# Patient Record
Sex: Male | Born: 1955 | Race: Black or African American | Hispanic: No | Marital: Single | Smoking: Never smoker
Health system: Southern US, Community
[De-identification: ages and names within clinical notes are randomized; demographics above are authoritative.]

## PROBLEM LIST (undated history)

## (undated) DIAGNOSIS — N400 Enlarged prostate without lower urinary tract symptoms: Secondary | ICD-10-CM

## (undated) DIAGNOSIS — I509 Heart failure, unspecified: Secondary | ICD-10-CM

## (undated) DIAGNOSIS — I2510 Atherosclerotic heart disease of native coronary artery without angina pectoris: Secondary | ICD-10-CM

## (undated) DIAGNOSIS — C61 Malignant neoplasm of prostate: Secondary | ICD-10-CM

## (undated) DIAGNOSIS — Z923 Personal history of irradiation: Secondary | ICD-10-CM

## (undated) DIAGNOSIS — I10 Essential (primary) hypertension: Secondary | ICD-10-CM

## (undated) HISTORY — DX: Personal history of irradiation: Z92.3

## (undated) HISTORY — PX: CARDIAC CATHETERIZATION: SHX172

## (undated) HISTORY — DX: Essential (primary) hypertension: I10

## (undated) HISTORY — DX: Benign prostatic hyperplasia without lower urinary tract symptoms: N40.0

## (undated) HISTORY — DX: Malignant neoplasm of prostate: C61

## (undated) HISTORY — PX: BIOPSY PROSTATE: PRO28

## (undated) HISTORY — DX: Heart failure, unspecified: I50.9

---

## 2000-06-16 ENCOUNTER — Emergency Department (HOSPITAL_COMMUNITY): Admission: EM | Admit: 2000-06-16 | Discharge: 2000-06-16 | Payer: Self-pay | Admitting: Emergency Medicine

## 2000-06-18 ENCOUNTER — Emergency Department (HOSPITAL_COMMUNITY): Admission: EM | Admit: 2000-06-18 | Discharge: 2000-06-18 | Payer: Self-pay | Admitting: Emergency Medicine

## 2000-06-29 ENCOUNTER — Encounter: Payer: Self-pay | Admitting: Emergency Medicine

## 2000-06-29 ENCOUNTER — Emergency Department (HOSPITAL_COMMUNITY): Admission: EM | Admit: 2000-06-29 | Discharge: 2000-06-29 | Payer: Self-pay | Admitting: Emergency Medicine

## 2000-08-14 ENCOUNTER — Inpatient Hospital Stay (HOSPITAL_COMMUNITY): Admission: EM | Admit: 2000-08-14 | Discharge: 2000-08-16 | Payer: Self-pay | Admitting: Emergency Medicine

## 2000-09-08 ENCOUNTER — Emergency Department (HOSPITAL_COMMUNITY): Admission: EM | Admit: 2000-09-08 | Discharge: 2000-09-08 | Payer: Self-pay | Admitting: Emergency Medicine

## 2003-09-09 ENCOUNTER — Emergency Department (HOSPITAL_COMMUNITY): Admission: EM | Admit: 2003-09-09 | Discharge: 2003-09-10 | Payer: Self-pay | Admitting: Emergency Medicine

## 2007-01-05 ENCOUNTER — Emergency Department (HOSPITAL_COMMUNITY): Admission: EM | Admit: 2007-01-05 | Discharge: 2007-01-05 | Payer: Self-pay | Admitting: Emergency Medicine

## 2007-08-02 DIAGNOSIS — I10 Essential (primary) hypertension: Secondary | ICD-10-CM

## 2008-05-27 ENCOUNTER — Emergency Department (HOSPITAL_COMMUNITY): Admission: EM | Admit: 2008-05-27 | Discharge: 2008-05-28 | Payer: Self-pay | Admitting: *Deleted

## 2008-08-21 ENCOUNTER — Emergency Department (HOSPITAL_COMMUNITY): Admission: EM | Admit: 2008-08-21 | Discharge: 2008-08-21 | Payer: Self-pay | Admitting: Emergency Medicine

## 2008-09-19 ENCOUNTER — Emergency Department (HOSPITAL_COMMUNITY): Admission: EM | Admit: 2008-09-19 | Discharge: 2008-09-19 | Payer: Self-pay | Admitting: Emergency Medicine

## 2008-09-24 ENCOUNTER — Ambulatory Visit: Payer: Self-pay | Admitting: Nurse Practitioner

## 2008-09-24 LAB — CONVERTED CEMR LAB
AST: 18 units/L (ref 0–37)
BUN: 14 mg/dL (ref 6–23)
Basophils Absolute: 0 10*3/uL (ref 0.0–0.1)
CO2: 21 meq/L (ref 19–32)
Eosinophils Absolute: 0.1 10*3/uL (ref 0.0–0.7)
Eosinophils Relative: 2 % (ref 0–5)
Monocytes Absolute: 0.4 10*3/uL (ref 0.1–1.0)
Monocytes Relative: 9 % (ref 3–12)
Neutrophils Relative %: 48 % (ref 43–77)
Sodium: 141 meq/L (ref 135–145)
TSH: 2.181 microintl units/mL (ref 0.350–4.50)
WBC: 4.7 10*3/uL (ref 4.0–10.5)

## 2008-09-29 ENCOUNTER — Ambulatory Visit: Payer: Self-pay | Admitting: *Deleted

## 2008-10-08 ENCOUNTER — Ambulatory Visit: Payer: Self-pay | Admitting: Nurse Practitioner

## 2008-10-08 LAB — CONVERTED CEMR LAB
Cholesterol: 125 mg/dL (ref 0–200)
HDL: 43 mg/dL (ref >39)
LDL Cholesterol: 76 mg/dL (ref 0–99)

## 2008-10-10 ENCOUNTER — Encounter (INDEPENDENT_AMBULATORY_CARE_PROVIDER_SITE_OTHER): Payer: Self-pay | Admitting: Nurse Practitioner

## 2008-10-22 ENCOUNTER — Ambulatory Visit: Payer: Self-pay | Admitting: Nurse Practitioner

## 2009-01-22 ENCOUNTER — Ambulatory Visit: Payer: Self-pay | Admitting: Nurse Practitioner

## 2009-02-12 ENCOUNTER — Ambulatory Visit: Payer: Self-pay | Admitting: Nurse Practitioner

## 2009-02-26 ENCOUNTER — Ambulatory Visit: Payer: Self-pay | Admitting: Nurse Practitioner

## 2009-02-26 DIAGNOSIS — N529 Male erectile dysfunction, unspecified: Secondary | ICD-10-CM

## 2009-05-11 ENCOUNTER — Emergency Department (HOSPITAL_COMMUNITY): Admission: EM | Admit: 2009-05-11 | Discharge: 2009-05-11 | Payer: Self-pay | Admitting: Family Medicine

## 2009-07-13 ENCOUNTER — Ambulatory Visit: Payer: Self-pay | Admitting: Nurse Practitioner

## 2009-07-13 LAB — CONVERTED CEMR LAB: Microalb, Ur: 0.59 mg/dL (ref 0.00–1.89)

## 2009-12-04 ENCOUNTER — Telehealth (INDEPENDENT_AMBULATORY_CARE_PROVIDER_SITE_OTHER): Payer: Self-pay | Admitting: Nurse Practitioner

## 2010-01-18 ENCOUNTER — Telehealth (INDEPENDENT_AMBULATORY_CARE_PROVIDER_SITE_OTHER): Payer: Self-pay | Admitting: Nurse Practitioner

## 2010-01-27 ENCOUNTER — Ambulatory Visit: Payer: Self-pay | Admitting: Nurse Practitioner

## 2010-01-27 LAB — CONVERTED CEMR LAB
Bilirubin Urine: NEGATIVE
Protein, U semiquant: NEGATIVE
Specific Gravity, Urine: 1.02
WBC Urine, dipstick: NEGATIVE
pH: 6

## 2010-01-28 DIAGNOSIS — C61 Malignant neoplasm of prostate: Secondary | ICD-10-CM | POA: Insufficient documentation

## 2010-01-28 LAB — CONVERTED CEMR LAB
ALT: 16 units/L (ref 0–53)
AST: 15 units/L (ref 0–37)
Albumin: 4.3 g/dL (ref 3.5–5.2)
Chloride: 100 meq/L (ref 96–112)
Eosinophils Absolute: 0.1 10*3/uL (ref 0.0–0.7)
HCT: 44.4 % (ref 39.0–52.0)
HDL: 42 mg/dL (ref >39)
Hemoglobin: 14.4 g/dL (ref 13.0–17.0)
LDL Cholesterol: 89 mg/dL (ref 0–99)
MCHC: 32.4 g/dL (ref 30.0–36.0)
MCV: 82.2 fL (ref 78.0–100.0)
Microalb, Ur: 0.5 mg/dL (ref 0.00–1.89)
Monocytes Absolute: 0.6 10*3/uL (ref 0.1–1.0)
Monocytes Relative: 10 % (ref 3–12)
Neutro Abs: 2.8 10*3/uL (ref 1.7–7.7)
RBC: 5.4 M/uL (ref 4.22–5.81)
Sodium: 139 meq/L (ref 135–145)
TSH: 2.602 microintl units/mL (ref 0.350–4.500)
Total Bilirubin: 0.8 mg/dL (ref 0.3–1.2)
Total CHOL/HDL Ratio: 3.3
Total Protein: 7.6 g/dL (ref 6.0–8.3)

## 2010-02-10 ENCOUNTER — Ambulatory Visit: Payer: Self-pay | Admitting: Nurse Practitioner

## 2010-02-10 ENCOUNTER — Telehealth (INDEPENDENT_AMBULATORY_CARE_PROVIDER_SITE_OTHER): Payer: Self-pay | Admitting: Nurse Practitioner

## 2010-02-10 DIAGNOSIS — K029 Dental caries, unspecified: Secondary | ICD-10-CM | POA: Insufficient documentation

## 2010-04-02 ENCOUNTER — Encounter (INDEPENDENT_AMBULATORY_CARE_PROVIDER_SITE_OTHER): Payer: Self-pay | Admitting: Nurse Practitioner

## 2010-04-26 ENCOUNTER — Encounter (INDEPENDENT_AMBULATORY_CARE_PROVIDER_SITE_OTHER): Payer: Self-pay | Admitting: Nurse Practitioner

## 2010-04-26 DIAGNOSIS — C61 Malignant neoplasm of prostate: Secondary | ICD-10-CM

## 2010-04-26 HISTORY — DX: Malignant neoplasm of prostate: C61

## 2010-05-05 ENCOUNTER — Encounter (INDEPENDENT_AMBULATORY_CARE_PROVIDER_SITE_OTHER): Payer: Self-pay | Admitting: Nurse Practitioner

## 2010-05-10 ENCOUNTER — Ambulatory Visit: Admission: RE | Admit: 2010-05-10 | Discharge: 2010-06-07 | Payer: Self-pay | Admitting: Radiation Oncology

## 2010-05-11 ENCOUNTER — Encounter (INDEPENDENT_AMBULATORY_CARE_PROVIDER_SITE_OTHER): Payer: Self-pay | Admitting: Nurse Practitioner

## 2010-05-14 LAB — BUN: BUN: 10 mg/dL (ref 6–23)

## 2010-05-21 ENCOUNTER — Ambulatory Visit (HOSPITAL_COMMUNITY): Admission: RE | Admit: 2010-05-21 | Discharge: 2010-05-21 | Payer: Self-pay | Admitting: Radiation Oncology

## 2010-05-24 ENCOUNTER — Encounter (INDEPENDENT_AMBULATORY_CARE_PROVIDER_SITE_OTHER): Payer: Self-pay | Admitting: Nurse Practitioner

## 2010-05-29 ENCOUNTER — Encounter (INDEPENDENT_AMBULATORY_CARE_PROVIDER_SITE_OTHER): Payer: Self-pay | Admitting: Nurse Practitioner

## 2010-06-07 ENCOUNTER — Encounter (INDEPENDENT_AMBULATORY_CARE_PROVIDER_SITE_OTHER): Payer: Self-pay | Admitting: Nurse Practitioner

## 2010-06-11 ENCOUNTER — Ambulatory Visit: Payer: Self-pay | Admitting: Nurse Practitioner

## 2010-07-14 ENCOUNTER — Ambulatory Visit
Admission: RE | Admit: 2010-07-14 | Discharge: 2010-07-29 | Payer: Self-pay | Source: Home / Self Care | Attending: Radiation Oncology | Admitting: Radiation Oncology

## 2010-07-15 ENCOUNTER — Encounter (INDEPENDENT_AMBULATORY_CARE_PROVIDER_SITE_OTHER): Payer: Self-pay | Admitting: Nurse Practitioner

## 2010-08-20 ENCOUNTER — Ambulatory Visit
Admission: RE | Admit: 2010-08-20 | Discharge: 2010-08-31 | Payer: Self-pay | Source: Home / Self Care | Attending: Radiation Oncology | Admitting: Radiation Oncology

## 2010-08-21 ENCOUNTER — Emergency Department (HOSPITAL_COMMUNITY)
Admission: EM | Admit: 2010-08-21 | Discharge: 2010-08-21 | Payer: Self-pay | Source: Home / Self Care | Admitting: Family Medicine

## 2010-08-31 NOTE — Letter (Signed)
Summary: SLIDING FEE SCALE /ELIGIBILITY AGREEMENT  SLIDING FEE SCALE /ELIGIBILITY AGREEMENT   Imported By: Arta Bruce 10/10/2008 11:50:56  _____________________________________________________________________  External Attachment:    Type:   Image     Comment:   External Document

## 2010-08-31 NOTE — Assessment & Plan Note (Signed)
Summary: Complete Physical Exam   Vital Signs:  Patient profile:   55 year old male Weight:      201.0 pounds BMI:     33.06 BSA:     2.00 Temp:     97.8 degrees F oral Pulse rate:   55 / minute Pulse rhythm:   regular Resp:     16 per minute BP sitting:   128 / 84  (left arm) Cuff size:   large  Vitals Entered By: Levon Hedger (January 27, 2010 8:31 AM)  Nutrition Counseling: Patient's BMI is greater than 25 and therefore counseled on weight management options. CC: CPE...cough x 1 month, Hypertension Management Is Patient Diabetic? No Pain Assessment Patient in pain? no       Does patient need assistance? Functional Status Self care Ambulation Normal   CC:  CPE...cough x 1 month and Hypertension Management.  History of Present Illness:  Pt into the office for a complete physical exam  Optho - No recent eye exam.  No current problems with eyes.    Dental - Pt has not been to the dentist in several years.  No current dental problems but admits that he has some caries and his teeth needs some attention.  ? about the dental clinic  Colonscopy - no history of colonscopy no blood noted in stools regular bowel habits  tdap - last over 10 years ago.  Obesity - weight up 6 pounds since the last visit.  no meaningful exercise.  Hypertension History:      He denies headache, chest pain, and palpitations.  Pt is taking blood presure medications as ordered.        Positive major cardiovascular risk factors include male age 72 years old or older and hypertension.  Negative major cardiovascular risk factors include negative family history for ischemic heart disease and non-tobacco-user status.        Further assessment for target organ damage reveals no history of ASHD, cardiac end-organ damage (CHF/LVH), stroke/TIA, peripheral vascular disease, renal insufficiency, or hypertensive retinopathy.     Habits & Providers  Alcohol-Tobacco-Diet     Alcohol drinks/day: 0  Alcohol type: quit in 2007     Tobacco Status: never  Exercise-Depression-Behavior     Does Patient Exercise: no     Depression Counseling: not indicated; screening negative for depression     Drug Use: past     Seat Belt Use: 0     Sun Exposure: occasionally  Comments: PHQ-9 score = 0  Medications Prior to Update: 1)  Norvasc 10 Mg Tabs (Amlodipine Besylate) .Marland Kitchen.. 1 Tablet By Mouth Daily For Blood Pressure 2)  Lisinopril-Hydrochlorothiazide 20-25 Mg Tabs (Lisinopril-Hydrochlorothiazide) .Marland Kitchen.. 1 Tablet By Mouth Daily For Blood Pressure 3)  Toprol Xl 100 Mg Xr24h-Tab (Metoprolol Succinate) .... One Tablet By Mouth Daily For Blood Pressure 4)  Viagra 100 Mg Tabs (Sildenafil Citrate) .... One Tablet By Mouth 30 Minutes Before Sexual Activity  Allergies: No Known Drug Allergies  Social History: Drug Use:  past  Review of Systems General:  Denies fever. Eyes:  Denies blurring. ENT:  Denies earache. CV:  Denies chest pain or discomfort. Resp:  Denies cough. GI:  Denies abdominal pain, constipation, nausea, and vomiting. GU:  Denies discharge and nocturia. MS:  Denies joint pain. Derm:  Denies rash. Neuro:  Denies headaches. Psych:  Denies anxiety and depression.  Physical Exam  General:  alert.   Head:  normocephalic.   Eyes:  pupils round.   Ears:  R ear normal and L ear normal.  Tm visible with bony landmarks present Nose:  no nasal discharge.   Mouth:  pharynx pink and moist and fair dentition.   some missing teeth with caries Neck:  supple.   Chest Wall:  no masses.   Breasts:  no gynecomastia.   Lungs:  normal breath sounds.   Heart:  normal rate and regular rhythm.   Abdomen:  soft, non-tender, and normal bowel sounds.   Rectal:  no external abnormalities.   Genitalia:  circumcised.   no inguinal hernias Prostate:  no gland enlargement.  nontender Msk:  up to the exam table Pulses:  R radial normal and L radial normal.   Extremities:  no edema Neurologic:   alert & oriented X3.   Skin:  color normal.   Psych:  Oriented X3 and good eye contact.     Impression & Recommendations:  Problem # 1:  Preventive Health Care (ICD-V70.0) PHQ-9 score = 0 Labs done today - fasting tdap given ekg done stool cards given x 3 take home colonscopy indications given to pt - unable to afford guaiac negative today in office.   prostate normal  Problem # 2:  HYPERTENSION, BENIGN ESSENTIAL (ICD-401.1) DASH diet stable continue current meds His updated medication list for this problem includes:    Norvasc 10 Mg Tabs (Amlodipine besylate) .Marland Kitchen... 1 tablet by mouth daily for blood pressure    Lisinopril-hydrochlorothiazide 20-25 Mg Tabs (Lisinopril-hydrochlorothiazide) .Marland Kitchen... 1 tablet by mouth daily for blood pressure    Toprol Xl 100 Mg Xr24h-tab (Metoprolol succinate) ..... One tablet by mouth daily for blood pressure  Orders: UA Dipstick w/o Micro (manual) (29562) EKG w/ Interpretation (93000) T-Lipid Profile (13086-57846) T-Comprehensive Metabolic Panel (96295-28413) T-PSA (24401-02725) T-CBC w/Diff (36644-03474) Rapid HIV  (25956) T-Urine Microalbumin w/creat. ratio [phone])  Complete Medication List: 1)  Norvasc 10 Mg Tabs (Amlodipine besylate) .Marland Kitchen.. 1 tablet by mouth daily for blood pressure 2)  Lisinopril-hydrochlorothiazide 20-25 Mg Tabs (Lisinopril-hydrochlorothiazide) .Marland Kitchen.. 1 tablet by mouth daily for blood pressure 3)  Toprol Xl 100 Mg Xr24h-tab (Metoprolol succinate) .... One tablet by mouth daily for blood pressure 4)  Viagra 100 Mg Tabs (Sildenafil citrate) .... One tablet by mouth 30 minutes before sexual activity  Other Orders: Hemoccult Cards MCR Screening (G0107) T-Syphilis Test (RPR) [id number]) T-TSH [phone]) T-GC Probe, urine (32202-54270) Hemoccult Cards -3 specimans (take home) (62376) Tdap => 69yrs IM (28315) Admin 1st Vaccine (17616) Admin 1st Vaccine [facility]) [phone])  Hypertension Assessment/Plan:      The  patient's hypertensive risk group is category B: At least one risk factor (excluding diabetes) with no target organ damage.  His calculated 10 year risk of coronary heart disease is 4 %.  Today's blood pressure is 128/84.  His blood pressure goal is < 140/90.  Patient Instructions: 1)  Blood pressure is doing great.  Continue current medications. 2)  You have received the tetanus vaccine today. It is good for 10 years so you won't need it again until age 65 3)  Return stool cards when available 4)  You will be notified of any abnormal lab results 5)  Follow up in 6 months or sooner if needed.  Laboratory Results   Urine Tests  Date/Time Received: January 27, 2010 8:35 AM   Routine Urinalysis   Color: lt. yellow Glucose: negative   (Normal Range: Negative) Bilirubin: negative   (Normal Range: Negative) Ketone: negative   (Normal Range: Negative) Spec. Gravity: 1.020   (Normal Range: 1.003-1.035)  Blood: negative   (Normal Range: Negative) pH: 6.0   (Normal Range: 5.0-8.0) Protein: negative   (Normal Range: Negative) Urobilinogen: 0.2   (Normal Range: 0-1) Nitrite: negative   (Normal Range: Negative) Leukocyte Esterace: negative   (Normal Range: Negative)        EKG  Procedure date:  01/27/2010  Findings:      sinus brady LVH   Prevention & Chronic Care Immunizations   Influenza vaccine: Not documented   Influenza vaccine deferral: Refused  (07/13/2009)    Tetanus booster: 01/27/2010: Tdap   Td booster deferral: Refused  (07/13/2009)    Pneumococcal vaccine: Not documented  Colorectal Screening   Hemoccult: Not documented   Hemoccult action/deferral: Ordered  (01/27/2010)   Hemoccult due: 01/28/2011    Colonoscopy: Not documented   Colonoscopy action/deferral: Deferred  (01/27/2010)  Other Screening   PSA: Not documented   PSA ordered.   PSA action/deferral: Discussed-PSA requested  (01/27/2010)   PSA due due: 01/28/2011   Smoking status: never   (01/27/2010)  Lipids   Total Cholesterol: 125  (10/08/2008)   Lipid panel action/deferral: Lipid Panel ordered   LDL: 76  (10/08/2008)   LDL Direct: Not documented   HDL: 43  (10/08/2008)   Triglycerides: 32  (10/08/2008)   Lipid panel due: 02/26/2010  Hypertension   Last Blood Pressure: 128 / 84  (01/27/2010)   Serum creatinine: 1.00  (09/24/2008)   BMP action: Ordered   Serum potassium 4.3  (09/24/2008) CMP ordered    Basic metabolic panel due: 02/26/2010    Hypertension flowsheet reviewed?: Yes   Progress toward BP goal: At goal  Self-Management Support :   Personal Goals (by the next clinic visit) :      Personal blood pressure goal: 130/80  (07/13/2009)   Patient will work on the following items until the next clinic visit to reach self-care goals:     Medications and monitoring: take my medicines every day, bring all of my medications to every visit  (01/27/2010)     Eating: eat more vegetables  (01/27/2010)     Activity: take a 30 minute walk every day  (01/27/2010)    Hypertension self-management support: Not documented   Nursing Instructions: Give tetanus booster today     Tetanus/Td Vaccine    Vaccine Type: Tdap    Site: right deltoid    Mfr: Sanofi Pasteur    Dose: 0.5 ml    Route: IM    Given by: Levon Hedger    Exp. Date: 06/02/2012    Lot #: [id number]    VIS given: 06/19/07 version given January 27, 2010.

## 2010-08-31 NOTE — Assessment & Plan Note (Signed)
Summary: HTN   Vital Signs:  Patient profile:   55 year old male Weight:      196.9 pounds BMI:     32.38 BSA:     1.98 Temp:     98.9 degrees F oral Pulse rate:   70 / minute Pulse rhythm:   regular Resp:     16 per minute BP sitting:   150 / 100  (left arm) Cuff size:   large  Vitals Entered By: Levon Hedger (June 11, 2010 3:59 PM)  Nutrition Counseling: Patient's BMI is greater than 25 and therefore counseled on weight management options. CC: [facility], Hypertension Management Is Patient Diabetic? No Pain Assessment Patient in pain? no       Does patient need assistance? Functional Status Self care Ambulation Normal   CC:  [facility] and Hypertension Management.  History of Present Illness:  Pt into the office for f/u on urology. Pt was dx with prostate cancer since last visit He has elected to get the seeds. He will f/u with urology in office. Pt well imformed about dx. Pt states that he is feeling well considering his recent treatment he plans to make every effort to keep up follow up  Hypertension History:      He denies headache, chest pain, and palpitations.  He notes no problems with any antihypertensive medication side effects.  Pt is without some of his blood pressure medications.        Positive major cardiovascular risk factors include male age 33 years old or older and hypertension.  Negative major cardiovascular risk factors include negative family history for ischemic heart disease and non-tobacco-user status.        Further assessment for target organ damage reveals no history of ASHD, cardiac end-organ damage (CHF/LVH), stroke/TIA, peripheral vascular disease, renal insufficiency, or hypertensive retinopathy.     Habits & Providers  Alcohol-Tobacco-Diet     Alcohol drinks/day: 0     Alcohol type: quit in 2007     Tobacco Status: never  Exercise-Depression-Behavior     Does  Patient Exercise: no     Depression Counseling: not indicated; screening negative for depression     Drug Use: past     Seat Belt Use: 0     Sun Exposure: occasionally  Allergies (verified): No Known Drug Allergies  Review of Systems General:  Denies fever. CV:  Denies chest pain or discomfort. Resp:  Denies cough. GI:  Denies abdominal pain, nausea, and vomiting. GU:  Complains of erectile dysfunction; denies dysuria.  Physical Exam  General:  alert.   Head:  normocephalic.   Neurologic:  alert & oriented X3.   Skin:  color normal.   Psych:  Oriented X3.     Impression & Recommendations:  Problem # 1:  HYPERTENSION, BENIGN ESSENTIAL (ICD-401.1) BP is elevated pt to get medications on next week His updated medication list for this problem includes:    Norvasc 10 Mg Tabs (Amlodipine besylate) .Marland Kitchen... 1 tablet by mouth daily for blood pressure    Lisinopril-hydrochlorothiazide 20-25 Mg Tabs (Lisinopril-hydrochlorothiazide) .Marland Kitchen... 1 tablet by mouth daily for blood pressure    Toprol Xl 100 Mg Xr24h-tab (Metoprolol succinate) ..... 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 1 tablet by mouth daily for blood pressure 2)  Lisinopril-hydrochlorothiazide 20-25 Mg Tabs (Lisinopril-hydrochlorothiazide) .Marland Kitchen.. 1 tablet by mouth daily for blood pressure 3)  Toprol Xl 100 Mg Xr24h-tab (Metoprolol succinate) .... One tablet by mouth daily for blood pressure 4)  Flomax 0.4 Mg Caps (Tamsulosin hcl) .... One tablet by mouth daily for prostate  Hypertension Assessment/Plan:      The patient's hypertensive risk group is category B: At least one risk factor (excluding diabetes)  with no target organ damage.  His calculated 10 year risk of coronary heart disease is 9 %.  Today's blood pressure is 150/100.  His blood pressure goal is < 140/90.  Patient Instructions: 1)  You need 3 blood pressure medications and 1 prostate medications. 2)  You should be able to get all 4 medications filed on next week at the pharmacy. 3)  Keep your appointment with urology for follow up on your prostate  Prescriptions: TOPROL XL 100 MG XR24H-TAB (METOPROLOL SUCCINATE) One tablet by mouth daily for blood pressure  #30 x 5   Entered and Authorized by:   Lehman Prom FNP   Signed by:   Lehman Prom FNP on 06/11/2010   Method used:   Faxed to ...       [facility] (retail)       [address] [place], [place]  [address]       Ph: [phone]       Fax: [phone]   RxID:   [id number]    Orders Added: 1)  Est. Patient Level III [[address]]    Prevention & Chronic Care Immunizations   Influenza vaccine: Refused  (06/11/2010)   Influenza vaccine deferral: Refused  (07/13/2009)    Tetanus booster: 01/27/2010: Tdap   Td booster deferral: Refused  (07/13/2009)    Pneumococcal vaccine: Not documented  Colorectal Screening   Hemoccult: Not documented   Hemoccult action/deferral: Ordered  (01/27/2010)   Hemoccult due: 01/28/2011    Colonoscopy: Not documented   Colonoscopy action/deferral: Deferred  (01/27/2010)  Other Screening   PSA: 15.31  (01/27/2010)   PSA action/deferral: Discussed-PSA requested  (01/27/2010)   PSA due due: 01/28/2011   Smoking status: never  (06/11/2010)  Lipids   Total Cholesterol: 140  (01/27/2010)   Lipid panel action/deferral: Lipid Panel ordered   LDL: 89  (01/27/2010)   LDL Direct: Not documented   HDL: 42  (01/27/2010)   Triglycerides: 47  (01/27/2010)   Lipid panel due: 02/26/2010  Hypertension   Last Blood Pressure: 150 / 100  (06/11/2010)   Serum creatinine: 0.94   (01/27/2010)   BMP action: Ordered   Serum potassium 3.6  (01/27/2010)   Basic metabolic panel due: 02/26/2010  Self-Management Support :   Personal Goals (by the next clinic visit) :      Personal blood pressure goal: 130/80  (07/13/2009)   Hypertension self-management support: Not documented

## 2010-08-31 NOTE — Letter (Signed)
Summary: DENTAL REFERRAL  DENTAL REFERRAL   Imported By: Arta Bruce 02/11/2010 12:27:49  _____________________________________________________________________  External Attachment:    Type:   Image     Comment:   External Document

## 2010-08-31 NOTE — Letter (Signed)
Summary: [facility]   Imported By: Arta Bruce 06/14/2010 10:26:51  _____________________________________________________________________  External Attachment:    Type:   Image     Comment:   External Document

## 2010-08-31 NOTE — Letter (Signed)
Summary: [facility]   Imported By: Arta Bruce 05/12/2010 14:58:57  _____________________________________________________________________  External Attachment:    Type:   Image     Comment:   External Document

## 2010-08-31 NOTE — Progress Notes (Signed)
Summary: refills  Phone Note Call from Patient Call back at [phone]   Summary of Call: The pt needs  more refills from his viagra to be call at [facility] ([address]) [name] FnP Initial call taken by: Manon Hilding,  January 18, 2010 2:00 PM  Follow-up for Phone Call        Spoke with pt. and advised of refill to [facility] [address]  Dutch Quint RN  January 18, 2010 4:23 PM  Follow-up by: Dutch Quint RN,  January 18, 2010 4:23 PM

## 2010-08-31 NOTE — Progress Notes (Signed)
Summary: NEEDS VIAGRA REFILLED  Phone Note Call from Patient Call back at Home Phone [phone]   Reason for Call: Refill Medication Summary of Call: [name], MR Vierra STOPPED BY BECAUSE HE WAS TOLD THAT HE CAN NO LONGER GET HIS VIAGRA AT [facility]. SO HE WANTS TO KNOW IF YOU CAN CALL THEM INTO [facility] ON [address]. Initial call taken by: Leodis Rains,  Dec 04, 2009 3:27 PM  Follow-up for Phone Call        forward to N. [name], FNP Follow-up by: Levon Hedger,  Dec 07, 2009 2:49 PM  Additional Follow-up for Phone Call Additional follow up Details #1::        Rx sent electronically to [facility] advise pt that the medication is very expensive but [facility] is no longer ordering the medications so unless he wants to pay for it there is no alternative at this time Additional Follow-up by: Lehman Prom FNP,  Dec 07, 2009 5:06 PM    Additional Follow-up for Phone Call Additional follow up Details #2::    The pt is aware the previous message.Manon Hilding  Dec 11, 2009 11:38 AM  Prescriptions: VIAGRA 100 MG TABS (SILDENAFIL CITRATE) One tablet by mouth 30 minutes before sexual activity  #10 x 0   Entered and Authorized by:   Lehman Prom FNP   Signed by:   Lehman Prom FNP on 12/07/2009   Method used:   Electronically to        [facility] [phone]* (retail)       [address]       [place], [place]  [address]       Ph: [phone]       Fax: [phone]   RxID:   [id number]

## 2010-08-31 NOTE — Letter (Signed)
Summary: radiation oncology  radiation oncology   Imported By: Arta Bruce 06/21/2010 14:26:56  _____________________________________________________________________  External Attachment:    Type:   Image     Comment:   External Document

## 2010-08-31 NOTE — Letter (Signed)
Summary: REFERRAL/UROLOGY//APPT DATE & TIME  REFERRAL/UROLOGY//APPT DATE & TIME   Imported By: Arta Bruce 02/12/2010 10:54:27  _____________________________________________________________________  External Attachment:    Type:   Image     Comment:   External Document

## 2010-08-31 NOTE — Miscellaneous (Signed)
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

## 2010-08-31 NOTE — Letter (Signed)
Summary: [facility]   Imported By: Arta Bruce 04/07/2010 12:31:52  _____________________________________________________________________  External Attachment:    Type:   Image     Comment:   External Document

## 2010-08-31 NOTE — Progress Notes (Signed)
Summary: Refills of Viagra  ---- Converted from flag ---- ---- 01/18/2010 1:15 PM, Lehman Prom FNP wrote: yes AND would call pt to let him know he need a CPE.  ---- 01/18/2010 12:06 PM, Dutch Quint RN wrote: Has had several refills of Viagra, but hasn't seen you since 12/10.  Needs CPE.  Is it OK to refill x1 with note to pharmacist?  Spoke with pt. and advised of refill and need for CPE.  Appt. 01/27/10.  Dutch Quint RN  January 18, 2010 4:22 PM   ------------------------------    Phone Note Outgoing Call

## 2010-08-31 NOTE — Progress Notes (Signed)
Summary: Office Visit//DEPRESSION SCREENING  Office Visit//DEPRESSION SCREENING   Imported By: Arta Bruce 01/28/2010 09:42:16  _____________________________________________________________________  External Attachment:    Type:   Image     Comment:   External Document

## 2010-08-31 NOTE — Progress Notes (Signed)
Summary: Urology referral  Phone Note Outgoing Call   Summary of Call: Refer to pt Urology he has transportation issues so would like to be referred locally He will prepare himself to pay $250 co-payment (if different) inform pt when you speak with him He has been started on flomax but really think he needs a prostat biopsy Initial call taken by: Lehman Prom FNP,  February 10, 2010 11:47 AM

## 2010-08-31 NOTE — Letter (Signed)
Summary: Handout Printed  Printed Handout:  - Prostate Problems

## 2010-08-31 NOTE — Letter (Signed)
[facility]  [facility]   Imported By: Arta Bruce 06/18/2010 16:41:07  _____________________________________________________________________  External Attachment:    Type:   Image     Comment:   External Document

## 2010-08-31 NOTE — Letter (Signed)
Summary: [facility]   Imported By: Arta Bruce 04/29/2010 12:53:09  _____________________________________________________________________  External Attachment:    Type:   Image     Comment:   External Document

## 2010-08-31 NOTE — Assessment & Plan Note (Signed)
Summary: F/u Labs   Vital Signs:  Patient profile:   55 year old male Weight:      196.4 pounds BMI:     32.30 Pulse rate:   62 / minute Pulse rhythm:   regular Resp:     16 per minute BP sitting:   148 / 87  (left arm) Cuff size:   large  Vitals Entered By: Levon Hedger (February 10, 2010 10:52 AM) CC: discuss lab results Is Patient Diabetic? No Pain Assessment Patient in pain? no       Does patient need assistance? Functional Status Self care Ambulation Normal   CC:  discuss lab results.  History of Present Illness:  Jeffery Graves into the office for f/u on PSA results PSA done 01/27/2010 and value was 15.31 no previous PSA check No enlargement and nontender noted during last CPE exam Jeffery Graves denies nocturia Hx erectile dysfunction  Allergies: No Known Drug Allergies  Review of Systems General:  Denies fever. CV:  Denies chest pain or discomfort. Resp:  Denies cough. GI:  Denies constipation. GU:  Complains of erectile dysfunction; denies dysuria and nocturia.  Physical Exam  General:  alert.   Head:  normocephalic.   Msk:  normal ROM.   Neurologic:  alert & oriented X3.   Skin:  color normal.   Psych:  Oriented X3.     Impression & Recommendations:  Problem # 1:  PROSTATE SPECIFIC ANTIGEN, ELEVATED (ICD-790.93)  reviewed labs today with Jeffery Graves he needs referral to urology will start on flomax handout   Orders: Urology Referral (Urology)  Problem # 2:  HYPERTENSION, BENIGN ESSENTIAL (ICD-401.1) continue current meds His updated medication list for this problem includes:    Norvasc 10 Mg Tabs (Amlodipine besylate) .Marland Kitchen... 1 tablet by mouth daily for blood pressure    Lisinopril-hydrochlorothiazide 20-25 Mg Tabs (Lisinopril-hydrochlorothiazide) .Marland Kitchen... 1 tablet by mouth daily for blood pressure    Toprol Xl 100 Mg Xr24h-tab (Metoprolol succinate) ..... One tablet by mouth daily for blood pressure  Problem # 3:  DENTAL CARIES (ICD-521.00) will refer to dental  clinic - advised Jeffery Graves that they will contact them directly with appt time/date Orders: Dental Referral (Dentist)  Complete Medication List: 1)  Norvasc 10 Mg Tabs (Amlodipine besylate) .Marland Kitchen.. 1 tablet by mouth daily for blood pressure 2)  Lisinopril-hydrochlorothiazide 20-25 Mg Tabs (Lisinopril-hydrochlorothiazide) .Marland Kitchen.. 1 tablet by mouth daily for blood pressure 3)  Toprol Xl 100 Mg Xr24h-tab (Metoprolol succinate) .... One tablet by mouth daily for blood pressure 4)  Viagra 100 Mg Tabs (Sildenafil citrate) .... One tablet by mouth 30 minutes before sexual activity 5)  Flomax 0.4 Mg Caps (Tamsulosin hcl) .... One tablet by mouth daily for prostate  Patient Instructions: 1)  You will be referred to urology for your prostate 2)  You will need to pay at least $250 to be seen 3)  You should start Flomax 0.4mg  by mouth daily. [facility] to pick it up. 4)  Follow up in 6 weeks for labs - PSA. Prescriptions: FLOMAX 0.4 MG CAPS (TAMSULOSIN HCL) One tablet by mouth daily for prostate  #30 x 3   Entered and Authorized by:   Lehman Prom FNP   Signed by:   Lehman Prom FNP on 02/10/2010   Method used:   Faxed to ...       [facility] (retail)       [address]  [place], [place]  [address]       Ph: [phone]       Fax: [phone]   RxID:   [id number]

## 2010-09-01 ENCOUNTER — Ambulatory Visit: Payer: Self-pay | Attending: Radiation Oncology | Admitting: Radiation Oncology

## 2010-09-01 DIAGNOSIS — Z510 Encounter for antineoplastic radiation therapy: Secondary | ICD-10-CM | POA: Insufficient documentation

## 2010-09-01 DIAGNOSIS — L293 Anogenital pruritus, unspecified: Secondary | ICD-10-CM | POA: Insufficient documentation

## 2010-09-01 DIAGNOSIS — C61 Malignant neoplasm of prostate: Secondary | ICD-10-CM | POA: Insufficient documentation

## 2010-09-02 NOTE — Letter (Signed)
Summary: RADIATION ONCOLOGY//FOLLOW UP  RADIATION ONCOLOGY//FOLLOW UP   Imported By: Arta Bruce 08/23/2010 10:32:14  _____________________________________________________________________  External Attachment:    Type:   Image     Comment:   External Document

## 2010-11-25 ENCOUNTER — Ambulatory Visit: Payer: Self-pay | Attending: Radiation Oncology | Admitting: Radiation Oncology
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
## 2010-12-17 NOTE — Discharge Summary (Signed)
[place]  Patient:    Jeffery Graves, Jeffery Graves                        MRN: [id number] Adm. Date:  [id number] Disc. Date: [id number] Attending:  Anise Salvo                           Discharge Summary  DISCHARGE DIAGNOSES: 1. Alcohol dependence with significant withdrawal symptoms, alcoholic    hallucinosis. 2. Hypertension possibly exacerbated by alcohol withdrawal.  DISCHARGE MEDICATIONS: 1. Librium 25 mg p.o. t.i.d. x 1 day, then 25 mg p.o. b.i.d. x 1 day, then    p.o. q day p.r.n. anxiety x 2-3 days, then stop. 2. Clonidine 0.2 mg 1 p.o. b.i.d. 3. Multivitamin 1 p.o. q day.  PROCEDURES: None.  CONSULTATIONS:  None.  HISTORY OF PRESENT ILLNESS: [name] is a 55 year old African-American male with a long standing history of polysubstance abuse, most recently alcohol abuse who presents for help coming off his alcohol.  He has recently been a 12 pack to 24 pack of beer per day drinker, has been tapering himself down over the last couple of weeks.  Two weeks ago, he also would drink a fifth of liquor per day but he stopped this two weeks.  He had a six pack of beer two days prior to admission, one beer on the day prior to admission, and has not had anything to drink on the day of his admission.  He has been having the shakes for about a month but this has resolved in the ED.  He has no history of seizures but has been feeling pins and needles in his arms, shoulders, and hips at bed time.  He had one episode on the morning prior to admission where he had a large BM and looked in the toilet and saw several worms with horns and "satan" in the toilet.  He denies any previous history of auditory or visual hallucinations.  He denies having any fevers coming off alcohol.  He was sober for four years from 40 to 12/19/1994 while he was still married but his grandmother died and he got divorced in 12/19/94 and he started drinking again. He wants to use the  church and possibly [facility] to stay sober now.  PAST MEDICAL HISTORY: Alcohol abuse and also marijuana use which he quit 10 weeks ago.  MEDICATIONS:  None  ALLERGIES:  Not allergic to any medicines.  FAMILY HISTORY: He is unsure about his father.  His mother is alive and healthy, has three siblings who are all healthy.  There is no diabetes, hypertension, or substance abuse in his family.  SOCIAL HISTORY: He lives in [place] by himself in an apartment.  He has substance abuse per HPI. He is not a smoker.  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  REVIEW OF SYSTEMS: Positive for about 15 pound weight loss while he was drinking over seven to eight months, but now that he is eating more, his weight is slowly increasing.  Otherwise, review of systems is noncontributory.  PHYSICAL EXAMINATION:  VITAL SIGNS:  Temperature 98.2, heart rate 78, blood pressure 187/111, respiratory rate 22, 98% on room air.  GENERAL:  Well developed, well nourished, African-American male in no acute distress.  HEENT:  Normocephalic, atraumatic.  Pupils are small, about 3 mm but reactive. Oropharynx is clear without  exudate.  Poor dentition.  No lymphadenopathy, no thyromegaly.  CARDIOVASCULAR:  Regular rate and rhythm without murmurs, rubs, or gallops. No JVD.  RESPIRATORY:  Clear to auscultation bilaterally.  GI:  Positive bowel sounds, nontender, nondistended, no HSM, no mass.  RECTAL:  Brown heme negative stool.  Prostate is firm, nontender, no nodules, good rectal tone.  EXTREMITIES:  Without clubbing, cyanosis, or edema.  NEUROLOGICAL:  He is awake and oriented x 4.  He does have a slight resting tremor.  Cranial nerves 2-12 are intact.  DTRs are 2+ bilateral lower extremities.  Strength is 5/5 bilaterally.  Sensation is intact. Finger-to-nose is normal.  Gait is normal.  There is icterus.  LABORATORY AND ACCESSORY DATA ON ADMISSION:  Alcohol is less than 10.   Urine drug screen is negative for all tested substances.  CMP is all within normal limits.  He has a white count of 4.9, hemoglobin 14, platelets 139,000.  EKG revealed normal sinus rhythm, 74 beats per minute with inverted Ts in the inferior leads.  There is no ST depression or elevation.  No prior EKGs to compare this to.  HOSPITAL COURSE: #1  ALCOHOL WITHDRAWAL AND HALLUCINOSIS:  The patient was admitted for Librium taper and observation.  He had no further episodes of hallucinations and experienced no anxiety or shaking spells while in the hospital.  He do not have any seizures either.  He did well on low doses of Librium and he is going to be discharged on the above dose.  The patient was given the option of [facility] and he agrees to be evaluated by them today.  He will be discharged straight to [facility] [place] for an evaluation.  He is motivated to quit his alcohol and I believe the church is going to be a large factor in this for him.  #2 HYPERTENSION:  His BPs in the hospital ranged in the 170-180/100s prior to starting Clonidine.  We did start 0.2 mg b.i.d. and he became normotensive with pressures in the 120/70s on the Clonidine.  I suspect his alcohol withdrawal may be exacerbating his hypertension but he almost surely has an underlying problem on hypertension.  We are going to leave him on the Clonidine for now and follow this up in the clinic once he has been off the alcohol for a little bit longer.  DISPOSITION: To home with stopping at [facility] first for possible evaluation for inpatient treatment.  FOLLOWUP:  With me in the internal medicine outpatient clinic on January 29 at 2:30 p.m. DD:  08/16/00 TD:  08/16/00 Job: [id number]

## 2011-02-16 ENCOUNTER — Ambulatory Visit
Admission: RE | Admit: 2011-02-16 | Discharge: 2011-02-16 | Disposition: A | Discharge status: Home / Self Care | Payer: Self-pay | Source: Ambulatory Visit | Attending: Radiation Oncology | Admitting: Radiation Oncology

## 2011-03-01 ENCOUNTER — Ambulatory Visit
Admission: RE | Admit: 2011-03-01 | Discharge: 2011-03-01 | Disposition: A | Discharge status: Home / Self Care | Payer: Self-pay | Source: Ambulatory Visit | Attending: Radiation Oncology | Admitting: Radiation Oncology

## 2011-05-02 LAB — URINALYSIS, ROUTINE W REFLEX MICROSCOPIC
Leukocytes, UA: NEGATIVE
Nitrite: NEGATIVE
Specific Gravity, Urine: 1.017

## 2011-05-02 LAB — POCT I-STAT, CHEM 8
BUN: 11
Calcium, Ion: 1.21
Chloride: 103
Creatinine, Ser: 1.2
Glucose, Bld: 96
HCT: 50
TCO2: 30

## 2011-05-02 LAB — URINE MICROSCOPIC-ADD ON

## 2011-06-29 ENCOUNTER — Ambulatory Visit
Admission: RE | Admit: 2011-06-29 | Discharge: 2011-06-29 | Disposition: A | Discharge status: Home / Self Care | Payer: Self-pay | Source: Ambulatory Visit | Attending: Radiation Oncology | Admitting: Radiation Oncology

## 2011-06-29 VITALS — BP 121/74 | HR 89 | Temp 98.7°F | Resp 18 | Wt 209.4 lb

## 2011-06-29 DIAGNOSIS — C61 Malignant neoplasm of prostate: Secondary | ICD-10-CM

## 2011-06-29 NOTE — Progress Notes (Signed)
Follow-up Note:  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 He is now 8 months following completion of IMRT. He is one year into his 2 years of antigen deprivation therapy. He is due for his fourth of 6 Depo-Lupron injections (four-month injection) he is without complaints today. No GU or GI difficulties. He does omit to occasional hot flashes. He is not sexually active. He tells me he'll see Dr. Brunilda Payor for a followup visit on December 3.  Physical examination: Not performed today.  Impression: Satisfactory progress.  Plan: He'll see Dr. Brunilda Payor for a followup visit on December 3. I will see [name] back for a followup visit in 4 months at which time he will have his next Depo-Lupron injection.

## 2011-06-29 NOTE — Progress Notes (Signed)
Please see the Nurse Progress Note in the MD Initial Consult Encounter for this patient. 

## 2011-06-29 NOTE — Progress Notes (Signed)
LUPRON 30MG  IM IN RIGHT GLUTEAL.  PT TOLERATED WELL.  WILL BE SEEN IN 4 MONTHS FOR ANOTHER INJECTION.     LOT #[id number]          [id number]

## 2011-06-29 NOTE — Progress Notes (Signed)
NO C/O, TALKS ABOUT HOW GOOD HE FEELS SINCE HE STOPPED DRINKING, SMOKING AND DOING DRUGS

## 2011-07-28 ENCOUNTER — Telehealth: Payer: Self-pay | Admitting: Radiation Oncology

## 2011-07-28 NOTE — Telephone Encounter (Signed)
Patient has been approved through [facility] and [facility] (orange card) for the [employer] with DSS Intake Worker Candis Musa.The approval information has not posted in Epic. However, I phone Urgent Care and they advised  the DSS worker is only available on Fridays with the hospital and I don't know if she has access to Epic to post the eligibility dates for billing purposes. I also spoke with the patient today and he advised his card eligibility period is 05/25/2011 - 11/23/2011.  Heather with [place] [facility]  was inquiring about pt's insurance services for dates 09/01/2010 - 10/29/2010.  I will follow-up with Diane tmrw to see if pt was approved for back dates of services 05/01/2010 - 06/29/2011 for his [employer].  [facility] [address] [place] [address]  Phone [phone]   07/29/2011: I spoke with Candis Musa and she advised [facility] through [employer] Card coverage dates:  08/31/2009 - 08/31/2010 10/15/2010 - 04/17/2011 05/25/2011 - 11/23/2011 No Coverage for Feb 2012 INDIGENT APPROVED 100%

## 2011-10-25 ENCOUNTER — Ambulatory Visit: Payer: Self-pay | Admitting: Radiation Oncology

## 2011-10-27 ENCOUNTER — Ambulatory Visit: Payer: Self-pay | Admitting: Radiation Oncology

## 2011-11-08 ENCOUNTER — Encounter: Payer: Self-pay | Admitting: Radiation Oncology

## 2011-11-08 ENCOUNTER — Ambulatory Visit
Admission: RE | Admit: 2011-11-08 | Discharge: 2011-11-08 | Disposition: A | Discharge status: Home / Self Care | Payer: Self-pay | Source: Ambulatory Visit | Attending: Radiation Oncology | Admitting: Radiation Oncology

## 2011-11-08 VITALS — BP 112/76 | HR 80 | Temp 97.7°F | Resp 18 | Wt 200.6 lb

## 2011-11-08 DIAGNOSIS — C61 Malignant neoplasm of prostate: Secondary | ICD-10-CM

## 2011-11-08 NOTE — Progress Notes (Signed)
Followup note:  [name] returns today approximately one year following completion of IMRT along with androgen deprivation therapy in the management of his Stage T2 a high-risk adenocarcinoma prostate. He is scheduled to receive his fifth four-month Depo-Lupron injection today. He doesn't occasional hot flashes. He remains active going to the gym. No GU or GI difficulties. He saw Dr. Brunilda Payor 1 month ago when his PSA was down to 0.22.  Physical examination not performed.  Impression: Satisfactory progress.  Plan: He'll return here for a followup visit in 4 months at which time he'll receive his last Depo-Lupron injection. This should be some time in August. We will make arrangements for his next Depo-Lupron injections through the drug company compassionate care program.

## 2011-11-08 NOTE — Progress Notes (Addendum)
Patient presents to the clinic today unaccompanied for a follow up appointment with Dr. Dayton Scrape. Patient is alert and oriented to person, place, and time. No distress noted. Steady gait noted. Pleasant affect noted. Patient denies pain at this time. Patient denies hematuria or burning with urination. Patient denies diarrhea. Patient reports a strong urine stream. Patient reports that on average 3 times per night to void. Patient reports an occasional hot flash. Patient has no other complaints. Reported all findings to Dr. Dayton Scrape.   Administered Lupron 30 mg IM into patient's left gluteal muscle. Patient tolerated well. Applied a bandaid to the injection site. LOT no. [id number] EXP 05/16/2014

## 2012-03-02 ENCOUNTER — Encounter: Payer: Self-pay | Admitting: *Deleted

## 2012-03-02 ENCOUNTER — Encounter: Payer: Self-pay | Admitting: Radiation Oncology

## 2012-03-02 NOTE — Progress Notes (Signed)
Never married, 7 children, works w/[employer]

## 2012-03-06 ENCOUNTER — Ambulatory Visit
Admission: RE | Admit: 2012-03-06 | Discharge: 2012-03-06 | Disposition: A | Discharge status: Home / Self Care | Payer: Self-pay | Source: Ambulatory Visit | Attending: Radiation Oncology | Admitting: Radiation Oncology

## 2012-03-06 ENCOUNTER — Encounter: Payer: Self-pay | Admitting: Radiation Oncology

## 2012-03-06 VITALS — BP 138/83 | HR 70 | Temp 97.7°F | Resp 20 | Wt 198.9 lb

## 2012-03-06 DIAGNOSIS — C61 Malignant neoplasm of prostate: Secondary | ICD-10-CM

## 2012-03-06 NOTE — Progress Notes (Signed)
Pt reports nocturia x 1-3. He denies other urinary issues, pain, fatigue, loss of appetite. Pt exercising regularly.

## 2012-03-06 NOTE — Progress Notes (Signed)
Per Dr Dayton Scrape, gave pt LupronDepot injection 30 mg IM in LVG, lot #[id number], exp date 08/23/14. Injection is 4 month dose. Pt tol well; bandaid applied.

## 2012-03-06 NOTE — Addendum Note (Signed)
Encounter addended by: Glennie Hawk, RN on: 03/06/2012 11:02 AM<BR>     Documentation filed: Visit Diagnoses, Notes Section

## 2012-03-06 NOTE — Progress Notes (Addendum)
Followup note: Jeffery Graves returns today approximately one year and 4 months following completion of radiation therapy along with androgen deprivation therapy the management of his Stage T2 A. high-risk adenocarcinoma prostate. He is scheduled for his last Depo-Lupron injection today which will complete 2 years of androgen deprivation therapy. He does report occasional hot flashes. He remains active, although his sex drive is diminished as expected. He was seen by Dr. Brunilda Payor 2 months ago and his PSA was 0.13 at [facility] on 12/28/2011.  Physical examination: Alert and oriented. Wt Readings from Last 3 Encounters:  03/06/12 198 lb 14.4 oz (90.22 kg)  11/08/11 200 lb 9.6 oz (90.992 kg)  06/29/11 209 lb 6.4 oz (94.983 kg)   Temp Readings from Last 3 Encounters:  03/06/12 97.7 F (36.5 C) Oral  11/08/11 97.7 F (36.5 C) Oral  06/29/11 98.7 F (37.1 C) Oral   BP Readings from Last 3 Encounters:  03/06/12 138/83  11/08/11 112/76  06/29/11 121/74   Pulse Readings from Last 3 Encounters:  03/06/12 70  11/08/11 80  06/29/11 89   Rectal examination not performed.  Laboratory data: PSA 0.13 from 12/28/2011  Impression: Satisfactory progress.  Plan: He will receive his last 4 month Depo-Lupron injection today and then maintain his followup through Dr. Brunilda Payor. I ask that Dr. Brunilda Payor keep me posted on his progress.  15 minutes was spent face-to-face with the patient, primarily counseling the patient.

## 2012-06-22 ENCOUNTER — Emergency Department (HOSPITAL_COMMUNITY)
Admission: EM | Admit: 2012-06-22 | Discharge: 2012-06-22 | Disposition: A | Discharge status: Home / Self Care | Payer: Self-pay | Source: Home / Self Care

## 2012-06-22 ENCOUNTER — Encounter (HOSPITAL_COMMUNITY): Payer: Self-pay | Admitting: Emergency Medicine

## 2012-06-22 DIAGNOSIS — I10 Essential (primary) hypertension: Secondary | ICD-10-CM

## 2012-06-22 MED ORDER — LISINOPRIL-HYDROCHLOROTHIAZIDE 20-25 MG PO TABS: 1.0000 | ORAL_TABLET | Freq: Every day | ORAL | Status: DC

## 2012-06-22 MED ORDER — TAMSULOSIN HCL 0.4 MG PO CAPS: 0.4000 mg | ORAL_CAPSULE | Freq: Every day | ORAL | Status: DC

## 2012-06-22 MED ORDER — AMLODIPINE BESYLATE 10 MG PO TABS: 10.0000 mg | ORAL_TABLET | Freq: Every day | ORAL | Status: DC

## 2012-06-22 MED ORDER — METOPROLOL SUCCINATE ER 100 MG PO TB24: 100.0000 mg | ORAL_TABLET | Freq: Every day | ORAL | Status: DC

## 2012-06-22 NOTE — ED Notes (Signed)
Medication refill

## 2012-06-22 NOTE — ED Provider Notes (Signed)
Patient Demographics  Jeffery Graves, is a 56 y.o. male  ZOX:[id number]  WJX:[id number]  DOB - 1955-11-07  Chief Complaint  Patient presents with  . Hypertension        Subjective:   [name] today has, No headache, No chest pain, No abdominal pain - No Nausea, No new weakness tingling or numbness, No Cough - SOB. Therefore his routine medication refill. No suprapubic pain no dysuria no hematuria  Objective:    Filed Vitals:   06/22/12 1242  BP: 117/74  Pulse: 57  Temp: 97.6 F (36.4 C)  TempSrc: Oral  SpO2: 100%     Exam  Awake Alert, Oriented X 3, No new F.N deficits, Normal affect [place].AT,PERRAL Supple Neck,No JVD, No cervical lymphadenopathy appriciated.  Symmetrical Chest wall movement, Good air movement bilaterally, CTAB RRR,No Gallops,Rubs or new Murmurs, No Parasternal Heave +ve B.Sounds, Abd Soft, Non tender, No organomegaly appriciated, No rebound - guarding or rigidity. No Cyanosis, Clubbing or edema, No new Rash or bruise    Data Review   CBC No results found for this basename: WBC:5,HGB:5,HCT:5,PLT:5,MCV:5,MCH:5,MCHC:5,RDW:5,NEUTRABS:5,LYMPHSABS:5,MONOABS:5,EOSABS:5,BASOSABS:5,BANDABS:5,BANDSABD:5 in the last 168 hours  Chemistries   No results found for this basename: NA:5,K:5,CL:5,CO2:5,GLUCOSE:5,BUN:5,CREATININE:5,GFRCGP,:5,CALCIUM:5,MG:5,AST:5,ALT:5,ALKPHOS:5,BILITOT:5 in the last 168 hours ------------------------------------------------------------------------------------------------------------------ No results found for this basename: HGBA1C:2 in the last 72 hours ------------------------------------------------------------------------------------------------------------------ No results found for this basename: CHOL:2,HDL:2,LDLCALC:2,TRIG:2,CHOLHDL:2,LDLDIRECT:2 in the last 72 hours ------------------------------------------------------------------------------------------------------------------ No results found for this basename:  TSH,T4TOTAL,FREET3,T3FREE,THYROIDAB in the last 72 hours ------------------------------------------------------------------------------------------------------------------ No results found for this basename: VITAMINB12:2,FOLATE:2,FERRITIN:2,TIBC:2,IRON:2,RETICCTPCT:2 in the last 72 hours  Coagulation profile  No results found for this basename: INR:5,PROTIME:5 in the last 168 hours  Urinalysis    Component Value Date/Time   COLORURINE lt. yellow 01/27/2010 0817   APPEARANCEUR TURBID* 05/28/2008 0358   LABSPEC 1.020 01/27/2010 0817   PHURINE 6.0 01/27/2010 0817   GLUCOSEU NEGATIVE 05/28/2008 0358   HGBUR negative 01/27/2010 0817   HGBUR NEGATIVE 05/28/2008 0358   BILIRUBINUR negative 01/27/2010 0817   KETONESUR NEGATIVE 05/28/2008 0358   PROTEINUR NEGATIVE 05/28/2008 0358   UROBILINOGEN 0.2 01/27/2010 0817   NITRITE negative 01/27/2010 0817   LEUKOCYTESUR NEGATIVE 05/28/2008 0358     Prior to Admission medications   Medication Sig Start Date End Date Taking? Authorizing Provider  amLODipine (NORVASC) 10 MG tablet Take 1 tablet (10 mg total) by mouth daily. 06/22/12   Leroy Sea, MD  lisinopril-hydrochlorothiazide (PRINZIDE,ZESTORETIC) 20-25 MG per tablet Take 1 tablet by mouth daily. 06/22/12   Leroy Sea, MD  metoprolol succinate (TOPROL-XL) 100 MG 24 hr tablet Take 1 tablet (100 mg total) by mouth daily. Take with or immediately following a meal. 06/22/12   Leroy Sea, MD  Tamsulosin HCl (FLOMAX) 0.4 MG CAPS Take 1 capsule (0.4 mg total) by mouth daily after supper. 06/22/12   Leroy Sea, MD     Assessment & Plan   1. Hypertension. Stable continue home medications refills provided for 3 months.   2. History of prostate cancer, currently on Flomax, he has finished his Lupron shots, he needs a referral to follow with Dr. Brunilda Payor, I have provided him with a referral to follow with him in the next couple of weeks to make sure his prostate cancer is in good control  and for any other related issues.   Leroy Sea M.D on 06/22/2012 at 12:51 PM   Leroy Sea, MD 06/22/12 1252

## 2012-07-18 ENCOUNTER — Emergency Department (HOSPITAL_COMMUNITY)
Admission: EM | Admit: 2012-07-18 | Discharge: 2012-07-18 | Disposition: A | Discharge status: Home / Self Care | Payer: No Typology Code available for payment source | Source: Home / Self Care

## 2012-07-18 ENCOUNTER — Encounter (HOSPITAL_COMMUNITY): Payer: Self-pay

## 2012-07-18 DIAGNOSIS — K029 Dental caries, unspecified: Secondary | ICD-10-CM

## 2012-07-18 DIAGNOSIS — Z23 Encounter for immunization: Secondary | ICD-10-CM

## 2012-07-18 DIAGNOSIS — I10 Essential (primary) hypertension: Secondary | ICD-10-CM

## 2012-07-18 DIAGNOSIS — C61 Malignant neoplasm of prostate: Secondary | ICD-10-CM

## 2012-07-18 LAB — COMPREHENSIVE METABOLIC PANEL
Albumin: 4 g/dL (ref 3.5–5.2)
Alkaline Phosphatase: 93 U/L (ref 39–117)
BUN: 18 mg/dL (ref 6–23)
Calcium: 9.6 mg/dL (ref 8.4–10.5)
GFR calc Af Amer: >90 mL/min (ref >90)
Glucose, Bld: 100 mg/dL — ABNORMAL HIGH (ref 70–99)
Potassium: 3.4 mEq/L — ABNORMAL LOW (ref 3.5–5.1)
Total Bilirubin: 0.4 mg/dL (ref 0.3–1.2)

## 2012-07-18 LAB — LIPID PANEL
Cholesterol: 156 mg/dL (ref 0–200)
Total CHOL/HDL Ratio: 3 RATIO

## 2012-07-18 MED ORDER — INFLUENZA VIRUS VACC SPLIT PF IM SUSP
0.5000 mL | Freq: Once | INTRAMUSCULAR | Status: AC
  Administered 2012-07-18: 0.5 mL via INTRAMUSCULAR

## 2012-07-18 NOTE — ED Provider Notes (Signed)
History     CSN: [id number]  Arrival date & time 07/18/12  1001   None    Chief Complaint  Patient presents with  . Follow-up   (Consider location/radiation/quality/duration/timing/severity/associated sxs/prior treatment) The history is provided by the patient. A language interpreter was used.   Pt says that he is going to see his urologist this week.  He says he needs his blood work done and have his PSA tested.   Pt says it has been about 4 months since last urology visit.    Past Medical History  Diagnosis Date  . Hypertension   . Prostate cancer 04/26/2010    gleason 7, vol 20.88 cc  . Hx of radiation therapy 09/06/10 - 10/29/10    prostate, seminal vesicles    History reviewed. No pertinent past surgical history.  Family History  Problem Relation Age of Onset  . Stroke Mother     History  Substance Use Topics  . Smoking status: Former Games developer  . Smokeless tobacco: Not on file  . Alcohol Use: No     Comment: hx heavy use per H&P 05/12/2011, none since 2003    Review of Systems  Constitutional: Negative.   Eyes: Negative.   Respiratory: Negative.   Cardiovascular: Negative.   Gastrointestinal: Negative.   Musculoskeletal: Negative.   Neurological: Negative.   Hematological: Negative.   Psychiatric/Behavioral: Negative.     Allergies  Review of patient's allergies indicates no known allergies.  Home Medications   Current Outpatient Rx  Name  Route  Sig  Dispense  Refill  . AMLODIPINE BESYLATE 10 MG PO TABS   Oral   Take 1 tablet (10 mg total) by mouth daily.   30 tablet   2   . LISINOPRIL-HYDROCHLOROTHIAZIDE 20-25 MG PO TABS   Oral   Take 1 tablet by mouth daily.   30 tablet   2   . METOPROLOL SUCCINATE ER 100 MG PO TB24   Oral   Take 1 tablet (100 mg total) by mouth daily. Take with or immediately following a meal.   30 tablet   2   . TAMSULOSIN HCL 0.4 MG PO CAPS   Oral   Take 1 capsule (0.4 mg total) by mouth daily after supper.   30  capsule   2     BP 120/72  Pulse 75  Temp 97.6 F (36.4 C) (Oral)  Resp 19  SpO2 100%  Physical Exam  Nursing note and vitals reviewed. Constitutional: He is oriented to person, place, and time. He appears well-developed and well-nourished. No distress.  HENT:  Head: Normocephalic and atraumatic.  Mouth/Throat: Oropharynx is clear and moist. Mucous membranes are not pale. Dental caries present.  Eyes: EOM are normal. Pupils are equal, round, and reactive to light.  Neck: Normal range of motion. Neck supple. Thyromegaly present.  Cardiovascular: Normal rate, regular rhythm and normal heart sounds.   Pulmonary/Chest: Effort normal and breath sounds normal.  Abdominal: Soft. Bowel sounds are normal.  Musculoskeletal: Normal range of motion.  Neurological: He is alert and oriented to person, place, and time. He has normal reflexes.  Skin: Skin is warm and dry.  Psychiatric: He has a normal mood and affect. His behavior is normal. Judgment and thought content normal.    ED Course  Procedures (including critical care time)  Labs Reviewed - No data to display No results found.  No diagnosis found.  MDM  IMPRESSION  Hypertension  Prostate Cancer s/p radiation and chemotherapy  RECOMMENDATIONS /  PLAN  BP controlled today, encouraged pt to take meds regularly and follow up with Dr. Brunilda Payor (urologist) Check Labs today Flu vaccine today   FOLLOW UP 3 months  The patient was given clear instructions to go to ER or return to medical center if symptoms don't improve, worsen or new problems develop.  The patient verbalized understanding.  The patient was told to call to get lab results if they haven't heard anything in the next week.           Cleora Fleet, MD 07/18/12 1031

## 2012-07-18 NOTE — ED Notes (Signed)
Follow up blood pressure.

## 2012-07-19 ENCOUNTER — Telehealth (HOSPITAL_COMMUNITY): Payer: Self-pay

## 2012-07-19 NOTE — Telephone Encounter (Signed)
Message copied by Lestine Mount on Thu Jul 19, 2012  6:21 PM ------      Message from: Cleora Fleet      Created: Wed Jul 18, 2012  3:08 PM       Please notify patient that labs came back good except potassium was a little low.  Cholesterol is good.  Recommend he take KCL 10 meq - take 1 po daily #30, RFx2,   Also, eat potassium rich diet.  Recheck in 3 months.             Rodney Langton, MD, CDE, FAAFP      [facility]      [facility]      [place], [place]

## 2012-09-28 ENCOUNTER — Telehealth (HOSPITAL_COMMUNITY): Payer: Self-pay

## 2012-10-19 ENCOUNTER — Emergency Department (INDEPENDENT_AMBULATORY_CARE_PROVIDER_SITE_OTHER)
Admission: EM | Admit: 2012-10-19 | Discharge: 2012-10-19 | Disposition: A | Discharge status: Home / Self Care | Payer: No Typology Code available for payment source | Source: Home / Self Care

## 2012-10-19 DIAGNOSIS — I10 Essential (primary) hypertension: Secondary | ICD-10-CM

## 2012-10-19 LAB — PSA: PSA: 0.25 ng/mL (ref <=4.00)

## 2012-10-19 NOTE — ED Notes (Signed)
Patient is seen  By Dr Brunilda Payor for his prostate cancer Has a lab requistion #[id number] needs lab drawn for his psa We will fax results to Dr Brunilda Payor office

## 2012-11-01 ENCOUNTER — Emergency Department (HOSPITAL_COMMUNITY)
Admission: EM | Admit: 2012-11-01 | Discharge: 2012-11-01 | Disposition: A | Discharge status: Home / Self Care | Payer: No Typology Code available for payment source | Source: Home / Self Care | Attending: Family Medicine | Admitting: Family Medicine

## 2012-11-01 ENCOUNTER — Encounter (HOSPITAL_COMMUNITY): Payer: Self-pay

## 2012-11-01 DIAGNOSIS — H11009 Unspecified pterygium of unspecified eye: Secondary | ICD-10-CM

## 2012-11-01 DIAGNOSIS — H11003 Unspecified pterygium of eye, bilateral: Secondary | ICD-10-CM

## 2012-11-01 MED ORDER — LUBRICANT EYE OP OINT: 1.0000 "application " | TOPICAL_OINTMENT | Freq: Every day | OPHTHALMIC | Status: DC

## 2012-11-01 MED ORDER — KETOTIFEN FUMARATE 0.025 % OP SOLN: 1.0000 [drp] | Freq: Two times a day (BID) | OPHTHALMIC | Status: DC

## 2012-11-01 MED ORDER — POLYETHYL GLYCOL-PROPYL GLYCOL 0.4-0.3 % OP SOLN: 2.0000 [drp] | Freq: Four times a day (QID) | OPHTHALMIC | Status: DC | PRN

## 2012-11-01 MED ORDER — TETRACAINE HCL 0.5 % OP SOLN
OPHTHALMIC | Status: AC
  Filled 2012-11-01: qty 2

## 2012-11-01 NOTE — ED Notes (Signed)
States 2 day duration of bilateral eye irritation, R>L; both eyes reddened; NAD

## 2012-11-05 NOTE — ED Provider Notes (Signed)
History     CSN: [id number]  Arrival date & time 11/01/12  1120   First MD Initiated Contact with Patient 11/01/12 1147      Chief Complaint  Patient presents with  . Eye Problem    (Consider location/radiation/quality/duration/timing/severity/associated sxs/prior treatment) HPI Comments: 57 y/o male non smoker. Here c/o bilateral eye redness for years but worse in last 2 days. Patient ststes he has been up at night taking care of his mother who is in ICU. His eyes have been significantly red. Denies pain, itchiness some times, no drainage. No visual changes. He works in [employer] and is exposed to sun light and dust on daily bases.    Past Medical History  Diagnosis Date  . Hypertension   . Prostate cancer 04/26/2010    gleason 7, vol 20.88 cc  . Hx of radiation therapy 09/06/10 - 10/29/10    prostate, seminal vesicles    History reviewed. No pertinent past surgical history.  Family History  Problem Relation Age of Onset  . Stroke Mother     History  Substance Use Topics  . Smoking status: Former Games developer  . Smokeless tobacco: Not on file  . Alcohol Use: No     Comment: hx heavy use per H&P 05/12/2011, none since 2003      Review of Systems  Constitutional: Negative for fever, chills and fatigue.  HENT: Negative for congestion and sore throat.   Eyes: Positive for redness and itching. Negative for photophobia, pain, discharge and visual disturbance.  Respiratory: Negative for cough.   Cardiovascular: Negative for chest pain.  Gastrointestinal: Negative for abdominal pain.  Genitourinary: Negative for dysuria.  Musculoskeletal: Negative for arthralgias.  Skin: Negative for rash.  Neurological: Negative for dizziness and headaches.    Allergies  Review of patient's allergies indicates no known allergies.  Home Medications   Current Outpatient Rx  Name  Route  Sig  Dispense  Refill  . amLODipine (NORVASC) 10 MG tablet   Oral   Take 1 tablet (10 mg total) by  mouth daily.   30 tablet   2   . Artificial Tear Ointment (LUBRICANT EYE) OINT   Ophthalmic   Apply 1 application to eye at bedtime. 1cm ribbon in lower conjunctiva of each eye daily at bed time.   1 Tube   0   . ketotifen (ZADITOR) 0.025 % ophthalmic solution   Both Eyes   Place 1 drop into both eyes 2 (two) times daily.   5 mL   0   . lisinopril-hydrochlorothiazide (PRINZIDE,ZESTORETIC) 20-25 MG per tablet   Oral   Take 1 tablet by mouth daily.   30 tablet   2   . metoprolol succinate (TOPROL-XL) 100 MG 24 hr tablet   Oral   Take 1 tablet (100 mg total) by mouth daily. Take with or immediately following a meal.   30 tablet   2   . Polyethyl Glycol-Propyl Glycol 0.4-0.3 % SOLN   Ophthalmic   Apply 2 drops to eye 4 (four) times daily as needed (en each eye).   1 Bottle   0   . Tamsulosin HCl (FLOMAX) 0.4 MG CAPS   Oral   Take 1 capsule (0.4 mg total) by mouth daily after supper.   30 capsule   2     BP 140/70  Pulse 70  Temp(Src) 97.4 F (36.3 C) (Oral)  Resp 16  SpO2 100%  Physical Exam  Nursing note and vitals reviewed. Constitutional: He is oriented  to person, place, and time. He appears well-developed and well-nourished. No distress.  HENT:  Head: Normocephalic and atraumatic.  Nose: Nose normal.  Mouth/Throat: Oropharynx is clear and moist. No oropharyngeal exudate.  Eyes: EOM are normal. Pupils are equal, round, and reactive to light. Right eye exhibits no discharge. Left eye exhibits no discharge.  There is bilateral overgrowth of the conjunctiva on the temporal side and starting to cover the iris in both eyes. Consistent with pterygium there is erythema and mild swelling associated. No ulcerations on fluorescein. No drainage.    Cardiovascular: Normal heart sounds.   Pulmonary/Chest: Breath sounds normal.  Lymphadenopathy:    He has no cervical adenopathy.  Neurological: He is alert and oriented to person, place, and time.  Skin: No rash noted.  He is not diaphoretic.    ED Course  Procedures (including critical care time)  Labs Reviewed - No data to display No results found.   1. Pterygium eye, bilateral       MDM  Prescribed lubricant eye ointment at night time, ketotifen and lubricant eye drops scheduled thorough the day. ophthalmology referral to follow up a needed.         Sharin Grave, MD 11/05/12 1101

## 2012-11-30 ENCOUNTER — Encounter (HOSPITAL_COMMUNITY): Payer: Self-pay

## 2012-11-30 ENCOUNTER — Emergency Department (HOSPITAL_COMMUNITY)
Admission: EM | Admit: 2012-11-30 | Discharge: 2012-11-30 | Disposition: A | Discharge status: Home Health | Payer: No Typology Code available for payment source | Source: Home / Self Care

## 2012-11-30 DIAGNOSIS — I10 Essential (primary) hypertension: Secondary | ICD-10-CM

## 2012-11-30 DIAGNOSIS — C61 Malignant neoplasm of prostate: Secondary | ICD-10-CM

## 2012-11-30 MED ORDER — LISINOPRIL-HYDROCHLOROTHIAZIDE 20-25 MG PO TABS: 1.0000 | ORAL_TABLET | Freq: Every day | ORAL | Status: DC

## 2012-11-30 MED ORDER — TAMSULOSIN HCL 0.4 MG PO CAPS: 0.4000 mg | ORAL_CAPSULE | Freq: Every day | ORAL | Status: DC

## 2012-11-30 MED ORDER — METOPROLOL SUCCINATE ER 100 MG PO TB24: 100.0000 mg | ORAL_TABLET | Freq: Every day | ORAL | Status: DC

## 2012-11-30 NOTE — ED Notes (Signed)
Patient is here for medication refill Hypertension

## 2012-11-30 NOTE — Progress Notes (Signed)
Patient Demographics  Jeffery Graves, is a 57 y.o. male  ZOX:[id number]  WJX:[id number]  DOB - 1956/07/27  Chief Complaint  Patient presents with  . Medication Refill        Subjective:   Jeffery Graves today is here for a follow up visit. Patient has a history of prostate cancer and is being followed by urology, he last saw urology just last month and next visit is in 6 months. He claims he no longer is on amlodipine, and is only taking the Toprol and combination of HCTZ and lisinopril. He also is taking Flomax. He currently has no major complaints and is here because he needs a refill for his antihypertensive medications.  Patient has No headache, No chest pain, No abdominal pain - No Nausea, No new weakness tingling or numbness, No Cough - SOB.   Objective:    Filed Vitals:   11/30/12 1641  BP: 133/72  Pulse: 71  Temp: 97.7 F (36.5 C)  TempSrc: Oral  Resp: 16  SpO2: 100%     ALLERGIES:  No Known Allergies  PAST MEDICAL HISTORY: Past Medical History  Diagnosis Date  . Hypertension   . Prostate cancer 04/26/2010    gleason 7, vol 20.88 cc  . Hx of radiation therapy 09/06/10 - 10/29/10    prostate, seminal vesicles    MEDICATIONS AT HOME: Prior to Admission medications   Medication Sig Start Date End Date Taking? Authorizing Provider  Artificial Tear Ointment (LUBRICANT EYE) OINT Apply 1 application to eye at bedtime. 1cm ribbon in lower conjunctiva of each eye daily at bed time. 11/01/12   Adlih Moreno-Coll, MD  ketotifen (ZADITOR) 0.025 % ophthalmic solution Place 1 drop into both eyes 2 (two) times daily. 11/01/12   Adlih Moreno-Coll, MD  lisinopril-hydrochlorothiazide (PRINZIDE,ZESTORETIC) 20-25 MG per tablet Take 1 tablet by mouth daily. 11/30/12   Shanker Levora Dredge, MD  metoprolol succinate (TOPROL-XL) 100 MG 24 hr tablet Take 1 tablet (100 mg total) by mouth daily. Take with or immediately following a meal. 11/30/12   Maretta Bees, MD  Polyethyl Glycol-Propyl Glycol  0.4-0.3 % SOLN Apply 2 drops to eye 4 (four) times daily as needed (en each eye). 11/01/12   Adlih Moreno-Coll, MD  tamsulosin (FLOMAX) 0.4 MG CAPS Take 1 capsule (0.4 mg total) by mouth daily after supper. 11/30/12   Shanker Levora Dredge, MD     Exam  General appearance :Awake, alert, not in any distress. Speech Clear. Not toxic Looking HEENT: Atraumatic and Normocephalic, pupils equally reactive to light and accomodation Neck: supple, no JVD. No cervical lymphadenopathy.  Chest:Good air entry bilaterally, no added sounds  CVS: S1 S2 regular, no murmurs.  Abdomen: Bowel sounds present, Non tender and not distended with no gaurding, rigidity or rebound. Extremities: B/L Lower Ext shows no edema, both legs are warm to touch Neurology: Awake alert, and oriented X 3, CN II-XII intact, Non focal Skin:No Rash Wounds:N/A    Data Review   CBC No results found for this basename: WBC, HGB, HCT, PLT, MCV, MCH, MCHC, RDW, NEUTRABS, LYMPHSABS, MONOABS, EOSABS, BASOSABS, BANDABS, BANDSABD,  in the last 168 hours  Chemistries   No results found for this basename: NA, K, CL, CO2, GLUCOSE, BUN, CREATININE, GFRCGP, CALCIUM, MG, AST, ALT, ALKPHOS, BILITOT,  in the last 168 hours ------------------------------------------------------------------------------------------------------------------ No results found for this basename: HGBA1C,  in the last 72 hours ------------------------------------------------------------------------------------------------------------------ No results found for this basename: CHOL, HDL, LDLCALC, TRIG, CHOLHDL, LDLDIRECT,  in the last 72  hours ------------------------------------------------------------------------------------------------------------------ No results found for this basename: TSH, T4TOTAL, FREET3, T3FREE, THYROIDAB,  in the last 72 hours ------------------------------------------------------------------------------------------------------------------ No results  found for this basename: VITAMINB12, FOLATE, FERRITIN, TIBC, IRON, RETICCTPCT,  in the last 72 hours  Coagulation profile  No results found for this basename: INR, PROTIME,  in the last 168 hours    Assessment & Plan   Active Problems: Hypertension - Controlled with just Toprol and combination of lisinopril and HCTZ-patient claims he no longer is taking amlodipine as he was not able to afford it. Blood pressure seems controlled with Toprol and combination of lisinopril and HCTZ.  Prostate CA - On Flomax-continue - Has a followup visit with Dr. Georgina Pillion in 6 months-claims his last saw him last month  Followup in 2 months.  Follow-up Information   Follow up with [facility]. Schedule an appointment as soon as possible for a visit in 2 months.

## 2013-01-17 ENCOUNTER — Ambulatory Visit: Payer: Self-pay | Attending: Family Medicine | Admitting: Internal Medicine

## 2013-01-17 VITALS — BP 152/91 | HR 46 | Temp 97.9°F | Ht 66.0 in | Wt 205.0 lb

## 2013-01-17 DIAGNOSIS — C61 Malignant neoplasm of prostate: Secondary | ICD-10-CM

## 2013-01-17 DIAGNOSIS — I10 Essential (primary) hypertension: Secondary | ICD-10-CM

## 2013-01-17 MED ORDER — TAMSULOSIN HCL 0.4 MG PO CAPS: 0.4000 mg | ORAL_CAPSULE | Freq: Every day | ORAL | Status: DC

## 2013-01-17 MED ORDER — AMLODIPINE BESYLATE 10 MG PO TABS: 10.0000 mg | ORAL_TABLET | Freq: Every day | ORAL | Status: DC

## 2013-01-17 MED ORDER — LISINOPRIL-HYDROCHLOROTHIAZIDE 20-25 MG PO TABS: 1.0000 | ORAL_TABLET | Freq: Every day | ORAL | Status: DC

## 2013-01-17 NOTE — Progress Notes (Signed)
Patient ID: Jeffery Graves, male   DOB: 10/02/1955, 57 y.o.   MRN: [id number]  CC: Followup for her blood pressure check  HPI: 57 year old male with past medical history of prostate cancer on hormone therapy, status post radiation therapy to the prostate, history of hypertension who presented to our clinic for followup for blood pressure check. Patient reports ever since he was started on metoprolol last visit he has been experiencing dizziness, lightheadedness and even memory loss. He does not tolerate this medication very well and would like to go back on what he used to take before which is Norvasc. No complaints of shortness of breath, palpitations or chest pain. No abdominal pain, nausea or vomiting. No weight gain or weight loss unexplained. No blood in the urine or stool. No fever or chills. No dysuria or urinary frequency or urgency.  No Known Allergies Past Medical History  Diagnosis Date  . Hypertension   . Prostate cancer 04/26/2010    gleason 7, vol 20.88 cc  . Hx of radiation therapy 09/06/10 - 10/29/10    prostate, seminal vesicles   Current Outpatient Prescriptions on File Prior to Visit  Medication Sig Dispense Refill  . Artificial Tear Ointment (LUBRICANT EYE) OINT Apply 1 application to eye at bedtime. 1cm ribbon in lower conjunctiva of each eye daily at bed time.  1 Tube  0  . ketotifen (ZADITOR) 0.025 % ophthalmic solution Place 1 drop into both eyes 2 (two) times daily.  5 mL  0  . Polyethyl Glycol-Propyl Glycol 0.4-0.3 % SOLN Apply 2 drops to eye 4 (four) times daily as needed (en each eye).  1 Bottle  0   No current facility-administered medications on file prior to visit.   Family History  Problem Relation Age of Onset  . Stroke Mother    History   Social History  . Marital Status: Divorced    Spouse Name: N/A    Number of Children: N/A  . Years of Education: N/A   Occupational History  . Not on file.   Social History Main Topics  . Smoking status: Former  Games developer  . Smokeless tobacco: Not on file  . Alcohol Use: No     Comment: hx heavy use per H&P 05/12/2011, none since 2003  . Drug Use: Not on file  . Sexually Active: Not on file   Other Topics Concern  . Not on file   Social History Narrative  . No narrative on file    Review of Systems  Constitutional: Negative for fever, chills, diaphoresis, activity change, appetite change and fatigue.  HENT: Negative for ear pain, nosebleeds, congestion, facial swelling, rhinorrhea, neck pain, neck stiffness and ear discharge.   Eyes: Negative for pain, discharge, redness, itching and visual disturbance.  Respiratory: Negative for cough, choking, chest tightness, shortness of breath, wheezing and stridor.   Cardiovascular: Negative for chest pain, palpitations and leg swelling.  Gastrointestinal: Negative for abdominal distention.  Genitourinary: Negative for dysuria, urgency, frequency, hematuria, flank pain, decreased urine volume, difficulty urinating and dyspareunia.  Musculoskeletal: Negative for back pain, joint swelling, arthralgias and gait problem.  Neurological: Positive for for dizziness, tremors, negative for seizures, syncope, facial asymmetry, speech difficulty, weakness, light-headedness, numbness and headaches.  Hematological: Negative for adenopathy. Does not bruise/bleed easily.  Psychiatric/Behavioral: Negative for hallucinations, behavioral problems, confusion, dysphoric mood, decreased concentration and agitation.    Objective:   Filed Vitals:   01/17/13 1042  BP: 152/91  Pulse: 46  Temp: 97.9 F (36.6 C)  Physical Exam  Constitutional: Appears well-developed and well-nourished. No distress.  HENT: Normocephalic. External right and left ear normal. Oropharynx is clear and moist.  Eyes: Conjunctivae and EOM are normal. PERRLA, no scleral icterus.  Neck: Normal ROM. Neck supple. No JVD. No tracheal deviation. No thyromegaly.  CVS: RRR, S1/S2 +, no murmurs, no  gallops, no carotid bruit.  Pulmonary: Effort and breath sounds normal, no stridor, rhonchi, wheezes, rales.  Abdominal: Soft. BS +,  no distension, tenderness, rebound or guarding.  Musculoskeletal: Normal range of motion. No edema and no tenderness.  Lymphadenopathy: No lymphadenopathy noted, cervical, inguinal. Neuro: Alert. Normal reflexes, muscle tone coordination. No cranial nerve deficit. Skin: Skin is warm and dry. No rash noted. Not diaphoretic. No erythema. No pallor.  Psychiatric: Normal mood and affect. Behavior, judgment, thought content normal.   Lab Results  Component Value Date   WBC 5.3 01/27/2010   HGB 14.4 01/27/2010   HCT 44.4 01/27/2010   MCV 82.2 01/27/2010   PLT 157 01/27/2010   Lab Results  Component Value Date   CREATININE 0.77 07/18/2012   BUN 18 07/18/2012   NA 137 07/18/2012   K 3.4* 07/18/2012   CL 99 07/18/2012   CO2 29 07/18/2012    No results found for this basename: HGBA1C   Lipid Panel     Component Value Date/Time   CHOL 156 07/18/2012 1050   TRIG 51 07/18/2012 1050   HDL 52 07/18/2012 1050   CHOLHDL 3.0 07/18/2012 1050   VLDL 10 07/18/2012 1050   LDLCALC 94 07/18/2012 1050       Assessment and plan:   Patient Active Problem List   Diagnosis Date Noted  . ADENOCARCINOMA, PROSTATE, GLEASON GRADE 6 - pt on hormone therapy  - Continue to take Flomax  01/28/2010  . HYPERTENSION, BENIGN ESSENTIAL - We have discussed target BP range - I have advised pt to check BP regularly and to call us back if the numbers are higher than 140/90 - discussed the importance of compliance with medical therapy and diet  - Due to experiencing side effects from metoprolol we will discontinue this medication and will prescribe Norvasc 10 mg daily. Continue combination lisinopril-hctz 08/02/2007

## 2013-01-17 NOTE — Patient Instructions (Signed)

## 2013-05-06 ENCOUNTER — Ambulatory Visit: Payer: Self-pay

## 2013-05-29 ENCOUNTER — Ambulatory Visit: Payer: No Typology Code available for payment source | Attending: Internal Medicine | Admitting: Internal Medicine

## 2013-05-29 VITALS — BP 145/80 | HR 66 | Temp 98.1°F | Resp 16

## 2013-05-29 DIAGNOSIS — C61 Malignant neoplasm of prostate: Secondary | ICD-10-CM

## 2013-05-29 LAB — CBC
HCT: 40.6 % (ref 39.0–52.0)
MCV: 76.3 fL — ABNORMAL LOW (ref 78.0–100.0)
Platelets: 161 10*3/uL (ref 150–400)
RBC: 5.32 MIL/uL (ref 4.22–5.81)
WBC: 4.3 10*3/uL (ref 4.0–10.5)

## 2013-05-29 MED ORDER — LISINOPRIL-HYDROCHLOROTHIAZIDE 20-25 MG PO TABS: 1.0000 | ORAL_TABLET | Freq: Every day | ORAL | Status: DC

## 2013-05-29 MED ORDER — TAMSULOSIN HCL 0.4 MG PO CAPS: 0.4000 mg | ORAL_CAPSULE | Freq: Every day | ORAL | Status: DC

## 2013-05-29 MED ORDER — AMLODIPINE BESYLATE 10 MG PO TABS: 10.0000 mg | ORAL_TABLET | Freq: Every day | ORAL | Status: DC

## 2013-05-29 NOTE — Progress Notes (Unsigned)
Patient here for medication refill HTN meds Prostate check

## 2013-05-29 NOTE — Progress Notes (Unsigned)
Patient ID: Jeffery Graves, male   DOB: 30-Jun-1956, 57 y.o.   MRN: [id number]  Patient Demographics  Jeffery Graves, is a 57 y.o. male  CSN: [id number]  MRN: [id number]  DOB - [birth year]  Outpatient Primary MD for the patient is Pcp Not In System   With History of -  Past Medical History  Diagnosis Date  . Hypertension   . Prostate cancer 04/26/2010    gleason 7, vol 20.88 cc  . Hx of radiation therapy 09/06/10 - 10/29/10    prostate, seminal vesicles  . BPH (benign prostatic hyperplasia)       History reviewed. No pertinent past surgical history.  in for   Chief Complaint  Patient presents with  . Medication Refill     HPI  Jeffery Graves  is a 57 y.o. male, with history of hypertension in good control, prostate cancer status post chemotherapy and radiation follows closely with her urologist Dr. Ezzie Dural, is here for routine history and physical, he has no subjective complaints whatsoever. No fever chills, no chest abdominal pain, no dysuria hematuria.    Review of Systems    In addition to the HPI above,   No Fever-chills, No Headache, No changes with Vision or hearing, No problems swallowing food or Liquids, No Chest pain, Cough or Shortness of Breath, No Abdominal pain, No Nausea or Vommitting, Bowel movements are regular, No Blood in stool or Urine, No dysuria, No new skin rashes or bruises, No new joints pains-aches,  No new weakness, tingling, numbness in any extremity, No recent weight gain or loss, No polyuria, polydypsia or polyphagia, No significant Mental Stressors.  A full 10 point Review of Systems was done, except as stated above, all other Review of Systems were negative.   Social History History  Substance Use Topics  . Smoking status: Former Games developer  . Smokeless tobacco: Not on file  . Alcohol Use: No     Comment: hx heavy use per H&P 05/12/2011, none since 2003      Family History Family History  Problem Relation Age of Onset  . Stroke  Mother       Prior to Admission medications   Medication Sig Start Date End Date Taking? Authorizing Provider  amLODipine (NORVASC) 10 MG tablet Take 1 tablet (10 mg total) by mouth daily. 05/29/13   Leroy Sea, MD  Artificial Tear Ointment (LUBRICANT EYE) OINT Apply 1 application to eye at bedtime. 1cm ribbon in lower conjunctiva of each eye daily at bed time. 11/01/12   Adlih Moreno-Coll, MD  ketotifen (ZADITOR) 0.025 % ophthalmic solution Place 1 drop into both eyes 2 (two) times daily. 11/01/12   Adlih Moreno-Coll, MD  lisinopril-hydrochlorothiazide (PRINZIDE,ZESTORETIC) 20-25 MG per tablet Take 1 tablet by mouth daily. 05/29/13   Leroy Sea, MD  Polyethyl Glycol-Propyl Glycol 0.4-0.3 % SOLN Apply 2 drops to eye 4 (four) times daily as needed (en each eye). 11/01/12   Adlih Moreno-Coll, MD  tamsulosin (FLOMAX) 0.4 MG CAPS capsule Take 1 capsule (0.4 mg total) by mouth daily after supper. 05/29/13   Leroy Sea, MD    No Known Allergies  Physical Exam  Vitals  Blood pressure 145/80, pulse 66, temperature 98.1 F (36.7 C), resp. rate 16, SpO2 100.00%.   1. General middle-aged African American male sitting on clinic examination table in no apparent distress,     2. Normal affect and insight, Not Suicidal or Homicidal, Awake Alert, Oriented X 3.  3. No F.N deficits,  ALL C.Nerves Intact, Strength 5/5 all 4 extremities, Sensation intact all 4 extremities, Plantars down going.  4. Ears and Eyes appear Normal, Conjunctivae clear, PERRLA. Moist Oral Mucosa.  5. Supple Neck, No JVD, No cervical lymphadenopathy appriciated, No Carotid Bruits.  6. Symmetrical Chest wall movement, Good air movement bilaterally, CTAB.  7. RRR, No Gallops, Rubs or Murmurs, No Parasternal Heave.  8. Positive Bowel Sounds, Abdomen Soft, Non tender, No organomegaly appriciated,No rebound -guarding or rigidity.  9.  No Cyanosis, Normal Skin Turgor, No Skin Rash or Bruise.  10. Good muscle tone,   joints appear normal , no effusions, Normal ROM.  11. No Palpable Lymph Nodes in Neck or Axillae     Data Review  Lab Results  Component Value Date   WBC 5.3 01/27/2010   HGB 14.4 01/27/2010   HCT 44.4 01/27/2010   MCV 82.2 01/27/2010   PLT 157 01/27/2010      Chemistry      Component Value Date/Time   NA 137 07/18/2012 1050   K 3.4* 07/18/2012 1050   CL 99 07/18/2012 1050   CO2 29 07/18/2012 1050   BUN 18 07/18/2012 1050   CREATININE 0.77 07/18/2012 1050      Component Value Date/Time   CALCIUM 9.6 07/18/2012 1050   ALKPHOS 93 07/18/2012 1050   AST 14 07/18/2012 1050   ALT 12 07/18/2012 1050   BILITOT 0.4 07/18/2012 1050       No results found for this basename: HGBA1C    Lab Results  Component Value Date   CHOL 156 07/18/2012   HDL 52 07/18/2012   LDLCALC 94 07/18/2012   TRIG 51 07/18/2012   CHOLHDL 3.0 07/18/2012    Lab Results  Component Value Date   TSH 2.602 01/27/2010    Lab Results  Component Value Date   PSA 0.25 10/19/2012   PSA 15.31* 01/27/2010         Assessment and plan  Hypertension stable medications represcribed.  History of prostate cancer status post chemotherapy and radiation, continue Flomax, he follows routinely and regularly with his urologist Dr. Ezzie Dural, appointment in 5 months   Routine health maintenance.  Screening labs. CBC, CMP, TSH, A1c, lipid panel  He is up-to-date on his flu and tetanus shot  Asked colonoscopy was 2 years ago per patient and it was normal        Leroy Sea M.D on 05/29/2013 at 2:19 PM

## 2013-05-30 LAB — COMPLETE METABOLIC PANEL WITH GFR
ALT: 16 U/L (ref 0–53)
AST: 17 U/L (ref 0–37)
Albumin: 4.2 g/dL (ref 3.5–5.2)
Alkaline Phosphatase: 80 U/L (ref 39–117)
BUN: 18 mg/dL (ref 6–23)
Creat: 0.97 mg/dL (ref 0.50–1.35)
GFR, Est Non African American: 88 mL/min
Potassium: 4 mEq/L (ref 3.5–5.3)
Total Bilirubin: 1 mg/dL (ref 0.3–1.2)

## 2013-05-30 LAB — TSH: TSH: 1.304 u[IU]/mL (ref 0.350–4.500)

## 2013-05-31 ENCOUNTER — Telehealth: Payer: Self-pay | Admitting: Emergency Medicine

## 2013-05-31 NOTE — Telephone Encounter (Signed)
Pt given lab results in person

## 2013-07-11 ENCOUNTER — Ambulatory Visit: Payer: No Typology Code available for payment source | Attending: Internal Medicine

## 2013-09-30 ENCOUNTER — Other Ambulatory Visit: Payer: Self-pay | Admitting: Internal Medicine

## 2013-10-01 ENCOUNTER — Telehealth: Payer: Self-pay

## 2013-10-01 MED ORDER — LISINOPRIL-HYDROCHLOROTHIAZIDE 20-25 MG PO TABS: 1.0000 | ORAL_TABLET | Freq: Every day | ORAL | Status: DC

## 2013-10-01 MED ORDER — AMLODIPINE BESYLATE 10 MG PO TABS: 10.0000 mg | ORAL_TABLET | Freq: Every day | ORAL | Status: DC

## 2013-10-01 MED ORDER — TAMSULOSIN HCL 0.4 MG PO CAPS
0.4000 mg | ORAL_CAPSULE | Freq: Every day | ORAL | Status: DC
Start: 2013-10-01 — End: 2013-10-03

## 2013-10-01 NOTE — Telephone Encounter (Signed)
Patient called needing refill on his medications [facility]

## 2013-10-03 ENCOUNTER — Encounter: Payer: Self-pay | Admitting: Internal Medicine

## 2013-10-03 ENCOUNTER — Ambulatory Visit: Payer: No Typology Code available for payment source | Attending: Internal Medicine | Admitting: Internal Medicine

## 2013-10-03 ENCOUNTER — Other Ambulatory Visit: Payer: Self-pay | Admitting: Emergency Medicine

## 2013-10-03 VITALS — BP 127/79 | HR 61 | Temp 98.3°F | Resp 16 | Wt 195.6 lb

## 2013-10-03 DIAGNOSIS — Z87891 Personal history of nicotine dependence: Secondary | ICD-10-CM | POA: Insufficient documentation

## 2013-10-03 DIAGNOSIS — Z923 Personal history of irradiation: Secondary | ICD-10-CM | POA: Insufficient documentation

## 2013-10-03 DIAGNOSIS — N400 Enlarged prostate without lower urinary tract symptoms: Secondary | ICD-10-CM

## 2013-10-03 DIAGNOSIS — Z139 Encounter for screening, unspecified: Secondary | ICD-10-CM

## 2013-10-03 DIAGNOSIS — I10 Essential (primary) hypertension: Secondary | ICD-10-CM

## 2013-10-03 DIAGNOSIS — Z8546 Personal history of malignant neoplasm of prostate: Secondary | ICD-10-CM | POA: Insufficient documentation

## 2013-10-03 LAB — COMPLETE METABOLIC PANEL WITH GFR
ALBUMIN: 4.1 g/dL (ref 3.5–5.2)
ALK PHOS: 68 U/L (ref 39–117)
ALT: 18 U/L (ref 0–53)
AST: 16 U/L (ref 0–37)
BUN: 14 mg/dL (ref 6–23)
CO2: 25 mEq/L (ref 19–32)
CREATININE: 0.88 mg/dL (ref 0.50–1.35)
Calcium: 8.9 mg/dL (ref 8.4–10.5)
Chloride: 106 mEq/L (ref 96–112)
GFR, Est African American: >89 mL/min
GFR, Est Non African American: >89 mL/min
Glucose, Bld: 73 mg/dL (ref 70–99)
POTASSIUM: 3.9 meq/L (ref 3.5–5.3)
Sodium: 139 mEq/L (ref 135–145)
Total Bilirubin: 0.8 mg/dL (ref 0.2–1.2)
Total Protein: 6.7 g/dL (ref 6.0–8.3)

## 2013-10-03 MED ORDER — TAMSULOSIN HCL 0.4 MG PO CAPS: 0.4000 mg | ORAL_CAPSULE | Freq: Every day | ORAL | Status: DC

## 2013-10-03 MED ORDER — LISINOPRIL-HYDROCHLOROTHIAZIDE 20-25 MG PO TABS: 1.0000 | ORAL_TABLET | Freq: Every day | ORAL | Status: DC

## 2013-10-03 MED ORDER — AMLODIPINE BESYLATE 10 MG PO TABS: 10.0000 mg | ORAL_TABLET | Freq: Every day | ORAL | Status: DC

## 2013-10-03 NOTE — Progress Notes (Signed)
Patient here for basic check up Needs prescriptions for his medications

## 2013-10-03 NOTE — Progress Notes (Signed)
MRN: [id number] Name: Jeffery Graves  Sex: male Age: 58 y.o. DOB: [birth year]  Allergies: Review of patient's allergies indicates no known allergies.  Chief Complaint  Patient presents with  . check uo    HPI: Patient is 58 y.o. male who has history of hypertension, prostate cancer, BPH comes today for followup her and is requesting refill on his medications denies any acute symptoms denies any headache dizziness chest and shortness of breath, as per patient he follows up with his urologist every 6 months.  Past Medical History  Diagnosis Date  . Hypertension   . Prostate cancer 04/26/2010    gleason 7, vol 20.88 cc  . Hx of radiation therapy 09/06/10 - 10/29/10    prostate, seminal vesicles  . BPH (benign prostatic hyperplasia)     History reviewed. No pertinent past surgical history.    Medication List       This list is accurate as of: 10/03/13 12:33 PM.  Always use your most recent med list.               amLODipine 10 MG tablet  Commonly known as:  NORVASC  Take 1 tablet (10 mg total) by mouth daily.     ketotifen 0.025 % ophthalmic solution  Commonly known as:  ZADITOR  Place 1 drop into both eyes 2 (two) times daily.     lisinopril-hydrochlorothiazide 20-25 MG per tablet  Commonly known as:  PRINZIDE,ZESTORETIC  Take 1 tablet by mouth daily.     LUBRICANT EYE Oint  Apply 1 application to eye at bedtime. 1cm ribbon in lower conjunctiva of each eye daily at bed time.     Polyethyl Glycol-Propyl Glycol 0.4-0.3 % Soln  Apply 2 drops to eye 4 (four) times daily as needed (en each eye).     tamsulosin 0.4 MG Caps capsule  Commonly known as:  FLOMAX  Take 1 capsule (0.4 mg total) by mouth daily after supper.        Meds ordered this encounter  Medications  . amLODipine (NORVASC) 10 MG tablet    Sig: Take 1 tablet (10 mg total) by mouth daily.    Dispense:  30 tablet    Refill:  3  . lisinopril-hydrochlorothiazide (PRINZIDE,ZESTORETIC) 20-25 MG per  tablet    Sig: Take 1 tablet by mouth daily.    Dispense:  30 tablet    Refill:  3  . tamsulosin (FLOMAX) 0.4 MG CAPS capsule    Sig: Take 1 capsule (0.4 mg total) by mouth daily after supper.    Dispense:  30 capsule    Refill:  3    Immunization History  Administered Date(s) Administered  . Influenza Split 07/18/2012  . Td 01/27/2010    Family History  Problem Relation Age of Onset  . Stroke Mother     History  Substance Use Topics  . Smoking status: Former Research scientist (life sciences)  . Smokeless tobacco: Not on file  . Alcohol Use: No     Comment: hx heavy use per H&P 05/12/2011, none since 2003    Review of Systems   As noted in HPI  Filed Vitals:   10/03/13 1159  BP: 127/79  Pulse: 61  Temp: 98.3 F (36.8 C)  Resp: 16    Physical Exam  Physical Exam  Constitutional: No distress.  Eyes: EOM are normal. Pupils are equal, round, and reactive to light.  Cardiovascular: Normal rate and regular rhythm.   Pulmonary/Chest: Breath sounds normal. No respiratory distress. He  has no wheezes. He has no rales.    CBC    Component Value Date/Time   WBC 4.3 05/29/2013 1426   RBC 5.32 05/29/2013 1426   HGB 14.1 05/29/2013 1426   HCT 40.6 05/29/2013 1426   PLT 161 05/29/2013 1426   MCV 76.3* 05/29/2013 1426   LYMPHSABS 1.8 01/27/2010 2219   MONOABS 0.6 01/27/2010 2219   EOSABS 0.1 01/27/2010 2219   BASOSABS 0.0 01/27/2010 2219    CMP     Component Value Date/Time   NA 134* 05/29/2013 1426   K 4.0 05/29/2013 1426   CL 99 05/29/2013 1426   CO2 25 05/29/2013 1426   GLUCOSE 76 05/29/2013 1426   BUN 18 05/29/2013 1426   CREATININE 0.97 05/29/2013 1426   CREATININE 0.77 07/18/2012 1050   CALCIUM 9.3 05/29/2013 1426   PROT 7.2 05/29/2013 1426   ALBUMIN 4.2 05/29/2013 1426   AST 17 05/29/2013 1426   ALT 16 05/29/2013 1426   ALKPHOS 80 05/29/2013 1426   BILITOT 1.0 05/29/2013 1426   GFRNONAA >90 07/18/2012 1050   GFRAA >90 07/18/2012 1050    Lab Results  Component Value  Date/Time   CHOL 156 07/18/2012 10:50 AM    No components found with this basename: hga1c    Lab Results  Component Value Date/Time   AST 17 05/29/2013  2:26 PM    Assessment and Plan  HYPERTENSION, BENIGN ESSENTIAL - Plan: amLODipine (NORVASC) 10 MG tablet, lisinopril-hydrochlorothiazide (PRINZIDE,ZESTORETIC) 20-25 MG per tablet, COMPLETE METABOLIC PANEL WITH GFR  BPH (benign prostatic hyperplasia) - Plan: tamsulosin (FLOMAX) 0.4 MG CAPS capsule  Screening - Plan: COMPLETE METABOLIC PANEL WITH GFR, Vit D  25 hydroxy (rtn osteoporosis monitoring)   Return in about 3 months (around 01/03/2014) for hypertension.  Lorayne Marek, MD

## 2013-10-04 ENCOUNTER — Telehealth: Payer: Self-pay

## 2013-10-04 DIAGNOSIS — N400 Enlarged prostate without lower urinary tract symptoms: Secondary | ICD-10-CM | POA: Insufficient documentation

## 2013-10-04 LAB — VITAMIN D 25 HYDROXY (VIT D DEFICIENCY, FRACTURES): VIT D 25 HYDROXY: 13 ng/mL — AB (ref 30–89)

## 2013-10-04 MED ORDER — VITAMIN D (ERGOCALCIFEROL) 1.25 MG (50000 UNIT) PO CAPS: 50000.0000 [IU] | ORAL_CAPSULE | ORAL | Status: DC

## 2013-10-04 NOTE — Telephone Encounter (Signed)
Message copied by Dorothe Pea on Fri Oct 04, 2013  1:59 PM ------      Message from: Lorayne Marek      Created: Fri Oct 04, 2013  9:23 AM       Blood work reviewed, noticed low vitamin D, call patient advise to start ergocalciferol 50,000 units once a week for the duration of  12 weeks. ------

## 2013-10-04 NOTE — Telephone Encounter (Signed)
Patient is aware of his lab results Will pick up prescription at the pharmacy

## 2013-10-23 ENCOUNTER — Telehealth: Payer: Self-pay

## 2013-10-23 ENCOUNTER — Ambulatory Visit: Payer: No Typology Code available for payment source | Attending: Internal Medicine

## 2013-10-23 ENCOUNTER — Other Ambulatory Visit: Payer: Self-pay

## 2013-10-23 DIAGNOSIS — N400 Enlarged prostate without lower urinary tract symptoms: Secondary | ICD-10-CM

## 2013-10-23 DIAGNOSIS — I10 Essential (primary) hypertension: Secondary | ICD-10-CM

## 2013-10-23 MED ORDER — AMLODIPINE BESYLATE 10 MG PO TABS: 10.0000 mg | ORAL_TABLET | Freq: Every day | ORAL | Status: DC

## 2013-10-23 MED ORDER — LISINOPRIL-HYDROCHLOROTHIAZIDE 20-25 MG PO TABS: 1.0000 | ORAL_TABLET | Freq: Every day | ORAL | Status: DC

## 2013-10-23 MED ORDER — TAMSULOSIN HCL 0.4 MG PO CAPS: 0.4000 mg | ORAL_CAPSULE | Freq: Every day | ORAL | Status: DC

## 2013-10-23 NOTE — Telephone Encounter (Signed)
Patient stopped in and needed refills on his medication Left prescription at home [facility]

## 2013-10-24 LAB — PSA: PSA: 0.76 ng/mL (ref <=4.00)

## 2013-12-30 ENCOUNTER — Other Ambulatory Visit: Payer: Self-pay | Admitting: Internal Medicine

## 2013-12-30 DIAGNOSIS — E559 Vitamin D deficiency, unspecified: Secondary | ICD-10-CM

## 2014-02-26 ENCOUNTER — Other Ambulatory Visit: Payer: Self-pay | Admitting: Internal Medicine

## 2014-02-27 ENCOUNTER — Telehealth: Payer: Self-pay | Admitting: Internal Medicine

## 2014-02-27 ENCOUNTER — Ambulatory Visit: Payer: No Typology Code available for payment source | Attending: Internal Medicine

## 2014-02-27 NOTE — Telephone Encounter (Signed)
Pt. Needs medications'  Refills  Please f/u wit Pt.

## 2014-03-13 ENCOUNTER — Ambulatory Visit: Payer: No Typology Code available for payment source | Attending: Internal Medicine | Admitting: Internal Medicine

## 2014-03-13 ENCOUNTER — Encounter: Payer: Self-pay | Admitting: Internal Medicine

## 2014-03-13 VITALS — BP 124/76 | HR 72 | Temp 98.9°F | Resp 18 | Wt 187.0 lb

## 2014-03-13 DIAGNOSIS — E559 Vitamin D deficiency, unspecified: Secondary | ICD-10-CM | POA: Insufficient documentation

## 2014-03-13 DIAGNOSIS — Z87891 Personal history of nicotine dependence: Secondary | ICD-10-CM | POA: Insufficient documentation

## 2014-03-13 DIAGNOSIS — I10 Essential (primary) hypertension: Secondary | ICD-10-CM | POA: Insufficient documentation

## 2014-03-13 DIAGNOSIS — N400 Enlarged prostate without lower urinary tract symptoms: Secondary | ICD-10-CM | POA: Insufficient documentation

## 2014-03-13 NOTE — Progress Notes (Signed)
Pt is here for a f/u on HTN BP today = 124/76 Alert w/no signs of acute distress

## 2014-03-13 NOTE — Patient Instructions (Signed)
DASH Eating Plan °DASH stands for "Dietary Approaches to Stop Hypertension." The DASH eating plan is a healthy eating plan that has been shown to reduce high blood pressure (hypertension). Additional health benefits may include reducing the risk of type 2 diabetes mellitus, heart disease, and stroke. The DASH eating plan may also help with weight loss. °WHAT DO I NEED TO KNOW ABOUT THE DASH EATING PLAN? °For the DASH eating plan, you will follow these general guidelines: °· Choose foods with a percent daily value for sodium of less than 5% (as listed on the food label). °· Use salt-free seasonings or herbs instead of table salt or sea salt. °· Check with your health care provider or pharmacist before using salt substitutes. °· Eat lower-sodium products, often labeled as "lower sodium" or "no salt added." °· Eat fresh foods. °· Eat more vegetables, fruits, and low-fat dairy products. °· Choose whole grains. Look for the word "whole" as the first word in the ingredient list. °· Choose fish and skinless chicken or turkey more often than red meat. Limit fish, poultry, and meat to 6 oz (170 g) each day. °· Limit sweets, desserts, sugars, and sugary drinks. °· Choose heart-healthy fats. °· Limit cheese to 1 oz (28 g) per day. °· Eat more home-cooked food and less restaurant, buffet, and fast food. °· Limit fried foods. °· Cook foods using methods other than frying. °· Limit canned vegetables. If you do use them, rinse them well to decrease the sodium. °· When eating at a restaurant, ask that your food be prepared with less salt, or no salt if possible. °WHAT FOODS CAN I EAT? °Seek help from a dietitian for individual calorie needs. °Grains °Whole grain or whole wheat bread. Brown rice. Whole grain or whole wheat pasta. Quinoa, bulgur, and whole grain cereals. Low-sodium cereals. Corn or whole wheat flour tortillas. Whole grain cornbread. Whole grain crackers. Low-sodium crackers. °Vegetables °Fresh or frozen vegetables  (raw, steamed, roasted, or grilled). Low-sodium or reduced-sodium tomato and vegetable juices. Low-sodium or reduced-sodium tomato sauce and paste. Low-sodium or reduced-sodium canned vegetables.  °Fruits °All fresh, canned (in natural juice), or frozen fruits. °Meat and Other Protein Products °Ground beef (85% or leaner), grass-fed beef, or beef trimmed of fat. Skinless chicken or turkey. Ground chicken or turkey. Pork trimmed of fat. All fish and seafood. Eggs. Dried beans, peas, or lentils. Unsalted nuts and seeds. Unsalted canned beans. °Dairy °Low-fat dairy products, such as skim or 1% milk, 2% or reduced-fat cheeses, low-fat ricotta or cottage cheese, or plain low-fat yogurt. Low-sodium or reduced-sodium cheeses. °Fats and Oils °Tub margarines without trans fats. Light or reduced-fat mayonnaise and salad dressings (reduced sodium). Avocado. Safflower, olive, or canola oils. Natural peanut or almond butter. °Other °Unsalted popcorn and pretzels. °The items listed above may not be a complete list of recommended foods or beverages. Contact your dietitian for more options. °WHAT FOODS ARE NOT RECOMMENDED? °Grains °White bread. White pasta. White rice. Refined cornbread. Bagels and croissants. Crackers that contain trans fat. °Vegetables °Creamed or fried vegetables. Vegetables in a cheese sauce. Regular canned vegetables. Regular canned tomato sauce and paste. Regular tomato and vegetable juices. °Fruits °Dried fruits. Canned fruit in light or heavy syrup. Fruit juice. °Meat and Other Protein Products °Fatty cuts of meat. Ribs, chicken wings, bacon, sausage, bologna, salami, chitterlings, fatback, hot dogs, bratwurst, and packaged luncheon meats. Salted nuts and seeds. Canned beans with salt. °Dairy °Whole or 2% milk, cream, half-and-half, and cream cheese. Whole-fat or sweetened yogurt. Full-fat   cheeses or blue cheese. Nondairy creamers and whipped toppings. Processed cheese, cheese spreads, or cheese  curds. °Condiments °Onion and garlic salt, seasoned salt, table salt, and sea salt. Canned and packaged gravies. Worcestershire sauce. Tartar sauce. Barbecue sauce. Teriyaki sauce. Soy sauce, including reduced sodium. Steak sauce. Fish sauce. Oyster sauce. Cocktail sauce. Horseradish. Ketchup and mustard. Meat flavorings and tenderizers. Bouillon cubes. Hot sauce. Tabasco sauce. Marinades. Taco seasonings. Relishes. °Fats and Oils °Butter, stick margarine, lard, shortening, ghee, and bacon fat. Coconut, palm kernel, or palm oils. Regular salad dressings. °Other °Pickles and olives. Salted popcorn and pretzels. °The items listed above may not be a complete list of foods and beverages to avoid. Contact your dietitian for more information. °WHERE CAN I FIND MORE INFORMATION? °National [employer], [employer], [employer]: www.nhlbi.nih.gov/health/health-topics/topics/dash/ °Document Released: 07/07/2011 Document Revised: 12/02/2013 Document Reviewed: 05/22/2013 °ExitCare® Patient Information ©2015 [employer]. This information is not intended to replace advice given to you by your health care provider. Make sure you discuss any questions you have with your health care provider. ° °

## 2014-03-13 NOTE — Progress Notes (Signed)
MRN: [id number] Name: Jeffery Graves  Sex: male Age: 58 y.o. DOB: 11-Jul-1956  Allergies: Review of patient's allergies indicates no known allergies.  Chief Complaint  Patient presents with  . Follow-up    HPI: Patient is 58 y.o. male who has history of hypertension comes today for followup, denies any acute symptoms denies any headache dizziness chest pain or shortness of breath, patient had a blood work done which was reviewed with him noticed vitamin D deficiency as per patient is taking over-the-counter vitamin D daily, patient also history of prostate cancer following up with urology.   Past Medical History  Diagnosis Date  . Hypertension   . Prostate cancer 04/26/2010    gleason 7, vol 20.88 cc  . Hx of radiation therapy 09/06/10 - 10/29/10    prostate, seminal vesicles  . BPH (benign prostatic hyperplasia)     History reviewed. No pertinent past surgical history.    Medication List       This list is accurate as of: 03/13/14 10:37 AM.  Always use your most recent med list.               amLODipine 10 MG tablet  Commonly known as:  NORVASC  TAKE 1 TABLET BY MOUTH DAILY.     ketotifen 0.025 % ophthalmic solution  Commonly known as:  ZADITOR  Place 1 drop into both eyes 2 (two) times daily.     lisinopril-hydrochlorothiazide 20-25 MG per tablet  Commonly known as:  PRINZIDE,ZESTORETIC  TAKE 1 TABLET BY MOUTH DAILY.     LUBRICANT EYE Oint  Apply 1 application to eye at bedtime. 1cm ribbon in lower conjunctiva of each eye daily at bed time.     Polyethyl Glycol-Propyl Glycol 0.4-0.3 % Soln  Apply 2 drops to eye 4 (four) times daily as needed (en each eye).     tamsulosin 0.4 MG Caps capsule  Commonly known as:  FLOMAX  TAKE 1 CAPSULE BY MOUTH DAILY AFTER SUPPER.     Vitamin D (Ergocalciferol) 50000 UNITS Caps capsule  Commonly known as:  DRISDOL  TAKE 1 CAPSULE BY MOUTH ONCE A WEEK        No orders of the defined types were placed in this encounter.    Immunization History  Administered Date(s) Administered  . Influenza Split 07/18/2012  . Td 01/27/2010    Family History  Problem Relation Age of Onset  . Stroke Mother     History  Substance Use Topics  . Smoking status: Former Research scientist (life sciences)  . Smokeless tobacco: Not on file  . Alcohol Use: No     Comment: hx heavy use per H&P 05/12/2011, none since 2003    Review of Systems   As noted in HPI  Filed Vitals:   03/13/14 1013  BP: 124/76  Pulse: 72  Temp: 98.9 F (37.2 C)  Resp: 18    Physical Exam  Physical Exam  Constitutional: No distress.  Eyes: EOM are normal. Pupils are equal, round, and reactive to light.  Cardiovascular: Normal rate and regular rhythm.   Pulmonary/Chest: Breath sounds normal. No respiratory distress. He has no wheezes. He has no rales.  Musculoskeletal: He exhibits no edema.    CBC    Component Value Date/Time   WBC 4.3 05/29/2013 1426   RBC 5.32 05/29/2013 1426   HGB 14.1 05/29/2013 1426   HCT 40.6 05/29/2013 1426   PLT 161 05/29/2013 1426   MCV 76.3* 05/29/2013 1426   LYMPHSABS 1.8 01/27/2010  2219   MONOABS 0.6 01/27/2010 2219   EOSABS 0.1 01/27/2010 2219   BASOSABS 0.0 01/27/2010 2219    CMP     Component Value Date/Time   NA 139 10/03/2013 1236   K 3.9 10/03/2013 1236   CL 106 10/03/2013 1236   CO2 25 10/03/2013 1236   GLUCOSE 73 10/03/2013 1236   BUN 14 10/03/2013 1236   CREATININE 0.88 10/03/2013 1236   CREATININE 0.77 07/18/2012 1050   CALCIUM 8.9 10/03/2013 1236   PROT 6.7 10/03/2013 1236   ALBUMIN 4.1 10/03/2013 1236   AST 16 10/03/2013 1236   ALT 18 10/03/2013 1236   ALKPHOS 68 10/03/2013 1236   BILITOT 0.8 10/03/2013 1236   GFRNONAA >89 10/03/2013 1236   GFRNONAA >90 07/18/2012 1050   GFRAA >89 10/03/2013 1236   GFRAA >90 07/18/2012 1050    Lab Results  Component Value Date/Time   CHOL 156 07/18/2012 10:50 AM    No components found with this basename: hga1c    Lab Results  Component Value Date/Time   AST 16 10/03/2013 12:36 PM     Assessment and Plan  HYPERTENSION, BENIGN ESSENTIAL Blood pressure is well controlled continue with amlodipine, lisinopril/hydrochlorothiazide. Will repeat blood chemistry on the next visit.   Unspecified vitamin D deficiency Patient is to take over-the-counter vitamin D supplement daily.  Health Maintenance -Colonoscopy: as per patient he had on done 3 years ago , obtain records  Return in about 4 months (around 07/13/2014) for hypertension.  Lorayne Marek, MD

## 2014-04-02 ENCOUNTER — Telehealth: Payer: Self-pay | Admitting: Internal Medicine

## 2014-04-02 NOTE — Telephone Encounter (Signed)
Patient is requesting a Lab test. He has gone to [facility]([phone]) and they need a blood test done before October 5. Please f/u with Patient at [phone]

## 2014-04-15 ENCOUNTER — Ambulatory Visit: Payer: No Typology Code available for payment source | Attending: Internal Medicine

## 2014-04-15 ENCOUNTER — Telehealth: Payer: Self-pay | Admitting: Internal Medicine

## 2014-04-15 NOTE — Telephone Encounter (Signed)
Patient is requesting a Lab test. He has gone to [phone]) and they need a blood test done before October 5.  Please f/u with Patient at [phone]

## 2014-04-15 NOTE — Telephone Encounter (Signed)
Pt stated had blood work done already

## 2014-04-24 ENCOUNTER — Telehealth: Payer: Self-pay

## 2014-04-24 ENCOUNTER — Telehealth: Payer: Self-pay | Admitting: Internal Medicine

## 2014-04-24 NOTE — Telephone Encounter (Signed)
Patient called requesting his lab results for his PSA Explained to patient he would have to call the office of the provider Who ordered the test.  The results are not in Epic for [place] to view

## 2014-04-24 NOTE — Telephone Encounter (Signed)
Patient wants to know his Lab results. Please f/u with Patient

## 2014-04-25 NOTE — Telephone Encounter (Signed)
Patient is here in the waiting room wanting to speak with a nurse about his results. He has been waiting for 1 week and 3 days and no one has called him about his results. He needs the results to be sent to another facility. Please follow up with pt.

## 2014-04-29 ENCOUNTER — Telehealth: Payer: Self-pay | Admitting: Emergency Medicine

## 2014-05-07 ENCOUNTER — Telehealth: Payer: Self-pay | Admitting: Internal Medicine

## 2014-05-07 NOTE — Telephone Encounter (Signed)
Pt. Would like to speak to doctor about some medications. Please f/u with pt.

## 2014-06-04 ENCOUNTER — Ambulatory Visit (HOSPITAL_BASED_OUTPATIENT_CLINIC_OR_DEPARTMENT_OTHER): Payer: No Typology Code available for payment source

## 2014-06-04 ENCOUNTER — Ambulatory Visit: Payer: No Typology Code available for payment source | Attending: Internal Medicine | Admitting: Internal Medicine

## 2014-06-04 ENCOUNTER — Encounter: Payer: Self-pay | Admitting: Internal Medicine

## 2014-06-04 VITALS — BP 122/77 | HR 68 | Temp 98.1°F | Resp 16 | Wt 190.6 lb

## 2014-06-04 DIAGNOSIS — Z23 Encounter for immunization: Secondary | ICD-10-CM

## 2014-06-04 DIAGNOSIS — Z8546 Personal history of malignant neoplasm of prostate: Secondary | ICD-10-CM | POA: Insufficient documentation

## 2014-06-04 DIAGNOSIS — I10 Essential (primary) hypertension: Secondary | ICD-10-CM

## 2014-06-04 DIAGNOSIS — Z923 Personal history of irradiation: Secondary | ICD-10-CM | POA: Insufficient documentation

## 2014-06-04 DIAGNOSIS — N400 Enlarged prostate without lower urinary tract symptoms: Secondary | ICD-10-CM

## 2014-06-04 DIAGNOSIS — Z09 Encounter for follow-up examination after completed treatment for conditions other than malignant neoplasm: Secondary | ICD-10-CM | POA: Insufficient documentation

## 2014-06-04 DIAGNOSIS — Z79899 Other long term (current) drug therapy: Secondary | ICD-10-CM | POA: Insufficient documentation

## 2014-06-04 LAB — COMPLETE METABOLIC PANEL WITH GFR
ALBUMIN: 4.3 g/dL (ref 3.5–5.2)
ALT: 22 U/L (ref 0–53)
AST: 17 U/L (ref 0–37)
Alkaline Phosphatase: 59 U/L (ref 39–117)
BUN: 16 mg/dL (ref 6–23)
CALCIUM: 8.9 mg/dL (ref 8.4–10.5)
CO2: 25 meq/L (ref 19–32)
CREATININE: 0.81 mg/dL (ref 0.50–1.35)
Chloride: 102 mEq/L (ref 96–112)
GFR, Est Non African American: >89 mL/min
Glucose, Bld: 79 mg/dL (ref 70–99)
Potassium: 4 mEq/L (ref 3.5–5.3)
Sodium: 137 mEq/L (ref 135–145)
Total Bilirubin: 0.7 mg/dL (ref 0.2–1.2)
Total Protein: 7 g/dL (ref 6.0–8.3)

## 2014-06-04 NOTE — Progress Notes (Signed)
MRN: [id number] Name: Jeffery Graves  Sex: male Age: 58 y.o. DOB: 07/14/1956  Allergies: Review of patient's allergies indicates no known allergies.  Chief Complaint  Patient presents with  . Follow-up    HPI: Patient is 58 y.o. male who comes today for followup, denies any acute symptoms has been compliant in taking his medications, also history of prostate cancer following up with urology, patient denies smoking cigarettes, patient will like to get her flu shot today. Patient denies any headache dizziness chest and shortness of breath.   Past Medical History  Diagnosis Date  . Hypertension   . Prostate cancer 04/26/2010    gleason 7, vol 20.88 cc  . Hx of radiation therapy 09/06/10 - 10/29/10    prostate, seminal vesicles  . BPH (benign prostatic hyperplasia)     History reviewed. No pertinent past surgical history.    Medication List       This list is accurate as of: 06/04/14 10:53 AM.  Always use your most recent med list.               amLODipine 10 MG tablet  Commonly known as:  NORVASC  TAKE 1 TABLET BY MOUTH DAILY.     ketotifen 0.025 % ophthalmic solution  Commonly known as:  ZADITOR  Place 1 drop into both eyes 2 (two) times daily.     lisinopril-hydrochlorothiazide 20-25 MG per tablet  Commonly known as:  PRINZIDE,ZESTORETIC  TAKE 1 TABLET BY MOUTH DAILY.     LUBRICANT EYE Oint  Apply 1 application to eye at bedtime. 1cm ribbon in lower conjunctiva of each eye daily at bed time.     Polyethyl Glycol-Propyl Glycol 0.4-0.3 % Soln  Apply 2 drops to eye 4 (four) times daily as needed (en each eye).     tamsulosin 0.4 MG Caps capsule  Commonly known as:  FLOMAX  TAKE 1 CAPSULE BY MOUTH DAILY AFTER SUPPER.     Vitamin D (Ergocalciferol) 50000 UNITS Caps capsule  Commonly known as:  DRISDOL  TAKE 1 CAPSULE BY MOUTH ONCE A WEEK        No orders of the defined types were placed in this encounter.    Immunization History  Administered Date(s)  Administered  . Influenza Split 07/18/2012  . Td 01/27/2010    Family History  Problem Relation Age of Onset  . Stroke Mother     History  Substance Use Topics  . Smoking status: Former Research scientist (life sciences)  . Smokeless tobacco: Not on file  . Alcohol Use: No     Comment: hx heavy use per H&P 05/12/2011, none since 2003    Review of Systems   As noted in HPI  Filed Vitals:   06/04/14 1015  BP: 122/77  Pulse: 68  Temp: 98.1 F (36.7 C)  Resp: 16    Physical Exam  Physical Exam  Constitutional: No distress.  Eyes: EOM are normal. Pupils are equal, round, and reactive to light.  Cardiovascular: Normal rate and regular rhythm.   Pulmonary/Chest: Breath sounds normal. No respiratory distress. He has no wheezes. He has no rales.  Musculoskeletal: He exhibits no edema.    CBC    Component Value Date/Time   WBC 4.3 05/29/2013 1426   RBC 5.32 05/29/2013 1426   HGB 14.1 05/29/2013 1426   HCT 40.6 05/29/2013 1426   PLT 161 05/29/2013 1426   MCV 76.3* 05/29/2013 1426   LYMPHSABS 1.8 01/27/2010 2219   MONOABS 0.6 01/27/2010 2219  EOSABS 0.1 01/27/2010 2219   BASOSABS 0.0 01/27/2010 2219    CMP     Component Value Date/Time   NA 139 10/03/2013 1236   K 3.9 10/03/2013 1236   CL 106 10/03/2013 1236   CO2 25 10/03/2013 1236   GLUCOSE 73 10/03/2013 1236   BUN 14 10/03/2013 1236   CREATININE 0.88 10/03/2013 1236   CREATININE 0.77 07/18/2012 1050   CALCIUM 8.9 10/03/2013 1236   PROT 6.7 10/03/2013 1236   ALBUMIN 4.1 10/03/2013 1236   AST 16 10/03/2013 1236   ALT 18 10/03/2013 1236   ALKPHOS 68 10/03/2013 1236   BILITOT 0.8 10/03/2013 1236   GFRNONAA >89 10/03/2013 1236   GFRNONAA >90 07/18/2012 1050   GFRAA >89 10/03/2013 1236   GFRAA >90 07/18/2012 1050    Lab Results  Component Value Date/Time   CHOL 156 07/18/2012 10:50 AM    No components found for: HGA1C  Lab Results  Component Value Date/Time   AST 16 10/03/2013 12:36 PM    Assessment and  Plan  HYPERTENSION, BENIGN ESSENTIAL - Plan: blood pressure is well controlled, repeat blood chemistryCOMPLETE METABOLIC PANEL WITH GFR  BPH (benign prostatic hyperplasia) Patient is taking Flomax and also following up with urology.  Needs flu shot   Health Maintenance Flu shot today   Return in about 4 months (around 10/03/2014) for hypertension.  Lorayne Marek, MD

## 2014-06-04 NOTE — Progress Notes (Signed)
Patient here for follow up on his hypertension Picked up his medications today from the pharmacy

## 2014-07-07 ENCOUNTER — Telehealth: Payer: Self-pay | Admitting: Internal Medicine

## 2014-07-07 ENCOUNTER — Other Ambulatory Visit: Payer: Self-pay | Admitting: Emergency Medicine

## 2014-07-07 ENCOUNTER — Other Ambulatory Visit: Payer: Self-pay | Admitting: Internal Medicine

## 2014-07-07 MED ORDER — TAMSULOSIN HCL 0.4 MG PO CAPS: 0.4000 mg | ORAL_CAPSULE | Freq: Every day | ORAL | Status: DC

## 2014-07-07 MED ORDER — LISINOPRIL-HYDROCHLOROTHIAZIDE 20-25 MG PO TABS: 1.0000 | ORAL_TABLET | Freq: Every day | ORAL | Status: DC

## 2014-07-07 MED ORDER — AMLODIPINE BESYLATE 10 MG PO TABS: 10.0000 mg | ORAL_TABLET | Freq: Every day | ORAL | Status: DC

## 2014-07-07 NOTE — Telephone Encounter (Signed)
Patient has come in today to request a refill for lisinopril-hydrochlorothiazide (PRINZIDE,ZESTORETIC) 20-25 MG per tablet; please f/u with patient about this refill

## 2014-09-09 ENCOUNTER — Ambulatory Visit: Payer: Self-pay | Attending: Internal Medicine

## 2014-09-15 ENCOUNTER — Ambulatory Visit: Payer: Self-pay | Admitting: Internal Medicine

## 2014-09-16 ENCOUNTER — Ambulatory Visit: Payer: Self-pay | Admitting: Internal Medicine

## 2014-09-23 ENCOUNTER — Encounter: Payer: Self-pay | Admitting: Internal Medicine

## 2014-09-23 ENCOUNTER — Ambulatory Visit: Payer: Self-pay | Admitting: Internal Medicine

## 2014-09-23 ENCOUNTER — Ambulatory Visit: Payer: Self-pay | Attending: Internal Medicine | Admitting: Internal Medicine

## 2014-09-23 VITALS — BP 130/74 | HR 64 | Temp 98.5°F | Resp 16 | Wt 192.0 lb

## 2014-09-23 DIAGNOSIS — Z87891 Personal history of nicotine dependence: Secondary | ICD-10-CM | POA: Insufficient documentation

## 2014-09-23 DIAGNOSIS — Z8546 Personal history of malignant neoplasm of prostate: Secondary | ICD-10-CM | POA: Insufficient documentation

## 2014-09-23 DIAGNOSIS — I10 Essential (primary) hypertension: Secondary | ICD-10-CM

## 2014-09-23 DIAGNOSIS — N400 Enlarged prostate without lower urinary tract symptoms: Secondary | ICD-10-CM

## 2014-09-23 DIAGNOSIS — Z923 Personal history of irradiation: Secondary | ICD-10-CM | POA: Insufficient documentation

## 2014-09-23 MED ORDER — LISINOPRIL-HYDROCHLOROTHIAZIDE 20-25 MG PO TABS: 1.0000 | ORAL_TABLET | Freq: Every day | ORAL | Status: DC

## 2014-09-23 MED ORDER — TAMSULOSIN HCL 0.4 MG PO CAPS: 0.4000 mg | ORAL_CAPSULE | Freq: Every day | ORAL | Status: DC

## 2014-09-23 MED ORDER — AMLODIPINE BESYLATE 10 MG PO TABS: 10.0000 mg | ORAL_TABLET | Freq: Every day | ORAL | Status: DC

## 2014-09-23 NOTE — Progress Notes (Signed)
MRN: [id number] Name: Jeffery Graves  Sex: male Age: 59 y.o. DOB: 20-Jun-1956  Allergies: Review of patient's allergies indicates no known allergies.  Chief Complaint  Patient presents with  . Follow-up    HPI: Patient is 59 y.o. male  who comes today for followup, patient has been compliant with his medications,denies any acute symptoms , history of prostate cancer following up with urology, today he is requesting refill on his medications, his blood pressure is well controlled, denies any headache dizziness chest and shortness of breath.   Past Medical History  Diagnosis Date  . Hypertension   . Prostate cancer 04/26/2010    gleason 7, vol 20.88 cc  . Hx of radiation therapy 09/06/10 - 10/29/10    prostate, seminal vesicles  . BPH (benign prostatic hyperplasia)     History reviewed. No pertinent past surgical history.    Medication List       This list is accurate as of: 09/23/14 12:20 PM.  Always use your most recent med list.               amLODipine 10 MG tablet  Commonly known as:  NORVASC  Take 1 tablet (10 mg total) by mouth daily.     ketotifen 0.025 % ophthalmic solution  Commonly known as:  ZADITOR  Place 1 drop into both eyes 2 (two) times daily.     lisinopril-hydrochlorothiazide 20-25 MG per tablet  Commonly known as:  PRINZIDE,ZESTORETIC  Take 1 tablet by mouth daily.     LUBRICANT EYE Oint  Apply 1 application to eye at bedtime. 1cm ribbon in lower conjunctiva of each eye daily at bed time.     Polyethyl Glycol-Propyl Glycol 0.4-0.3 % Soln  Apply 2 drops to eye 4 (four) times daily as needed (en each eye).     tamsulosin 0.4 MG Caps capsule  Commonly known as:  FLOMAX  Take 1 capsule (0.4 mg total) by mouth daily after supper.     Vitamin D (Ergocalciferol) 50000 UNITS Caps capsule  Commonly known as:  DRISDOL  TAKE 1 CAPSULE BY MOUTH ONCE A WEEK        Meds ordered this encounter  Medications  . amLODipine (NORVASC) 10 MG tablet   Sig: Take 1 tablet (10 mg total) by mouth daily.    Dispense:  30 tablet    Refill:  3  . lisinopril-hydrochlorothiazide (PRINZIDE,ZESTORETIC) 20-25 MG per tablet    Sig: Take 1 tablet by mouth daily.    Dispense:  30 tablet    Refill:  3  . tamsulosin (FLOMAX) 0.4 MG CAPS capsule    Sig: Take 1 capsule (0.4 mg total) by mouth daily after supper.    Dispense:  30 capsule    Refill:  3    Immunization History  Administered Date(s) Administered  . Influenza Split 07/18/2012  . Influenza,inj,Quad PF,36+ Mos 06/04/2014  . Td 01/27/2010    Family History  Problem Relation Age of Onset  . Stroke Mother     History  Substance Use Topics  . Smoking status: Former Research scientist (life sciences)  . Smokeless tobacco: Not on file  . Alcohol Use: No     Comment: hx heavy use per H&P 05/12/2011, none since 2003    Review of Systems   As noted in HPI  Filed Vitals:   09/23/14 1202  BP: 130/74  Pulse: 64  Temp: 98.5 F (36.9 C)  Resp: 16    Physical Exam  Physical Exam  Constitutional: No distress.  Eyes: EOM are normal. Pupils are equal, round, and reactive to light.  Cardiovascular: Normal rate and regular rhythm.   Pulmonary/Chest: No respiratory distress. He has no wheezes.  Musculoskeletal: He exhibits no edema.    CBC    Component Value Date/Time   WBC 4.3 05/29/2013 1426   RBC 5.32 05/29/2013 1426   HGB 14.1 05/29/2013 1426   HCT 40.6 05/29/2013 1426   PLT 161 05/29/2013 1426   MCV 76.3* 05/29/2013 1426   LYMPHSABS 1.8 01/27/2010 2219   MONOABS 0.6 01/27/2010 2219   EOSABS 0.1 01/27/2010 2219   BASOSABS 0.0 01/27/2010 2219    CMP     Component Value Date/Time   NA 137 06/04/2014 1102   K 4.0 06/04/2014 1102   CL 102 06/04/2014 1102   CO2 25 06/04/2014 1102   GLUCOSE 79 06/04/2014 1102   BUN 16 06/04/2014 1102   CREATININE 0.81 06/04/2014 1102   CREATININE 0.77 07/18/2012 1050   CALCIUM 8.9 06/04/2014 1102   PROT 7.0 06/04/2014 1102   ALBUMIN 4.3 06/04/2014 1102    AST 17 06/04/2014 1102   ALT 22 06/04/2014 1102   ALKPHOS 59 06/04/2014 1102   BILITOT 0.7 06/04/2014 1102   GFRNONAA >89 06/04/2014 1102   GFRNONAA >90 07/18/2012 1050   GFRAA >89 06/04/2014 1102   GFRAA >90 07/18/2012 1050    Lab Results  Component Value Date/Time   CHOL 156 07/18/2012 10:50 AM    No components found for: HGA1C  Lab Results  Component Value Date/Time   AST 17 06/04/2014 11:02 AM    Assessment and Plan  HYPERTENSION, BENIGN ESSENTIAL - Plan:blood pressure is well controlled continue with current medications amLODipine (NORVASC) 10 MG tablet, lisinopril-hydrochlorothiazide (PRINZIDE,ZESTORETIC) 20-25 MG per tablet, COMPLETE METABOLIC PANEL WITH GFR  BPH (benign prostatic hyperplasia) - Plan: tamsulosin (FLOMAX) 0.4 MG CAPS capsule Patient following up with the urology.  Health Maintenance -Colonoscopy: discussed about colonoscopy as per patient he will think about it.  -Vaccinations:  Up-to-date with flu shot   Return in about 3 months (around 12/22/2014).   This note has been created with Surveyor, quantity. Any transcriptional errors are unintentional.    Lorayne Marek, MD

## 2014-09-23 NOTE — Progress Notes (Signed)
Patient here for follow up on his blood pressure and for Medication refills

## 2014-10-13 ENCOUNTER — Other Ambulatory Visit: Payer: Self-pay

## 2014-10-22 ENCOUNTER — Ambulatory Visit: Payer: Self-pay | Attending: Internal Medicine

## 2014-10-22 DIAGNOSIS — I10 Essential (primary) hypertension: Secondary | ICD-10-CM

## 2014-10-23 LAB — COMPLETE METABOLIC PANEL WITH GFR
ALT: 20 U/L (ref 0–53)
AST: 19 U/L (ref 0–37)
Albumin: 4.3 g/dL (ref 3.5–5.2)
Alkaline Phosphatase: 62 U/L (ref 39–117)
BUN: 17 mg/dL (ref 6–23)
CO2: 24 meq/L (ref 19–32)
Calcium: 9.2 mg/dL (ref 8.4–10.5)
Chloride: 103 mEq/L (ref 96–112)
Creat: 0.92 mg/dL (ref 0.50–1.35)
GFR, Est Non African American: >89 mL/min
GLUCOSE: 105 mg/dL — AB (ref 70–99)
Potassium: 3.7 mEq/L (ref 3.5–5.3)
SODIUM: 139 meq/L (ref 135–145)
TOTAL PROTEIN: 7.1 g/dL (ref 6.0–8.3)
Total Bilirubin: 0.6 mg/dL (ref 0.2–1.2)

## 2014-10-27 ENCOUNTER — Telehealth: Payer: Self-pay

## 2014-10-27 NOTE — Telephone Encounter (Signed)
Patient returned phone call and is aware of his lab results  

## 2014-10-27 NOTE — Telephone Encounter (Signed)
Pt returning nurse's call, please f/u with pt.

## 2014-10-27 NOTE — Telephone Encounter (Signed)
Pt called, returning nurse's phone call. Please f/u with pt  °

## 2014-10-27 NOTE — Telephone Encounter (Signed)
Patient not available Left message on voice mail to return our call 

## 2014-10-27 NOTE — Telephone Encounter (Signed)
-----   Message from Lorayne Marek, MD sent at 10/23/2014  1:02 PM EDT ----- Blood work reviewed noticed impaired fasting glucose, call and advise patient for low carbohydrate diet.

## 2015-02-23 ENCOUNTER — Encounter: Payer: Self-pay | Admitting: Internal Medicine

## 2015-02-23 ENCOUNTER — Ambulatory Visit: Payer: Self-pay | Attending: Internal Medicine | Admitting: Internal Medicine

## 2015-02-23 VITALS — BP 137/76 | HR 71 | Temp 98.8°F | Resp 16 | Wt 178.0 lb

## 2015-02-23 DIAGNOSIS — Z87891 Personal history of nicotine dependence: Secondary | ICD-10-CM | POA: Insufficient documentation

## 2015-02-23 DIAGNOSIS — Z8546 Personal history of malignant neoplasm of prostate: Secondary | ICD-10-CM | POA: Insufficient documentation

## 2015-02-23 DIAGNOSIS — N400 Enlarged prostate without lower urinary tract symptoms: Secondary | ICD-10-CM | POA: Insufficient documentation

## 2015-02-23 DIAGNOSIS — R7301 Impaired fasting glucose: Secondary | ICD-10-CM | POA: Insufficient documentation

## 2015-02-23 DIAGNOSIS — Z79899 Other long term (current) drug therapy: Secondary | ICD-10-CM | POA: Insufficient documentation

## 2015-02-23 DIAGNOSIS — I10 Essential (primary) hypertension: Secondary | ICD-10-CM | POA: Insufficient documentation

## 2015-02-23 LAB — HEMOGLOBIN A1C
HEMOGLOBIN A1C: 5.5 % (ref <5.7)
MEAN PLASMA GLUCOSE: 111 mg/dL (ref <117)

## 2015-02-23 LAB — COMPLETE METABOLIC PANEL WITH GFR
ALBUMIN: 4 g/dL (ref 3.6–5.1)
ALK PHOS: 61 U/L (ref 40–115)
ALT: 15 U/L (ref 9–46)
AST: 14 U/L (ref 10–35)
BILIRUBIN TOTAL: 0.7 mg/dL (ref 0.2–1.2)
BUN: 20 mg/dL (ref 7–25)
CO2: 27 mmol/L (ref 20–31)
CREATININE: 1 mg/dL (ref 0.70–1.33)
Calcium: 8.7 mg/dL (ref 8.6–10.3)
Chloride: 103 mmol/L (ref 98–110)
GFR, EST NON AFRICAN AMERICAN: 83 mL/min (ref >=60)
GFR, Est African American: >89 mL/min (ref >=60)
GLUCOSE: 98 mg/dL (ref 65–99)
POTASSIUM: 3.9 mmol/L (ref 3.5–5.3)
Sodium: 137 mmol/L (ref 135–146)
Total Protein: 6.5 g/dL (ref 6.1–8.1)

## 2015-02-23 MED ORDER — AMLODIPINE BESYLATE 10 MG PO TABS: 10.0000 mg | ORAL_TABLET | Freq: Every day | ORAL | Status: DC

## 2015-02-23 MED ORDER — TAMSULOSIN HCL 0.4 MG PO CAPS: 0.4000 mg | ORAL_CAPSULE | Freq: Every day | ORAL | Status: DC

## 2015-02-23 MED ORDER — LISINOPRIL-HYDROCHLOROTHIAZIDE 20-25 MG PO TABS
1.0000 | ORAL_TABLET | Freq: Every day | ORAL | Status: DC
Start: 2015-02-23 — End: 2015-10-27

## 2015-02-23 NOTE — Progress Notes (Signed)
MRN: [id number] Name: Jeffery Graves  Sex: male Age: 59 y.o. DOB: 02-Apr-1956  Allergies: Review of patient's allergies indicates no known allergies.  Chief Complaint  Patient presents with  . Follow-up    HPI: Patient is 59 y.o. male who has to of hypertension, history of prostate cancer/BPH currently following up with urology, comes today for followup he denies any acute symptoms denies any headache dizziness chest and shortness of breath, as per patient is compliant in taking his medications, he is blood work reviewed with the patient noticed impaired fasting glucose.  Past Medical History  Diagnosis Date  . Hypertension   . Prostate cancer 04/26/2010    gleason 7, vol 20.88 cc  . Hx of radiation therapy 09/06/10 - 10/29/10    prostate, seminal vesicles  . BPH (benign prostatic hyperplasia)     History reviewed. No pertinent past surgical history.    Medication List       This list is accurate as of: 02/23/15 10:23 AM.  Always use your most recent med list.               amLODipine 10 MG tablet  Commonly known as:  NORVASC  Take 1 tablet (10 mg total) by mouth daily.     ketotifen 0.025 % ophthalmic solution  Commonly known as:  ZADITOR  Place 1 drop into both eyes 2 (two) times daily.     lisinopril-hydrochlorothiazide 20-25 MG per tablet  Commonly known as:  PRINZIDE,ZESTORETIC  Take 1 tablet by mouth daily.     LUBRICANT EYE Oint  Apply 1 application to eye at bedtime. 1cm ribbon in lower conjunctiva of each eye daily at bed time.     Polyethyl Glycol-Propyl Glycol 0.4-0.3 % Soln  Apply 2 drops to eye 4 (four) times daily as needed (en each eye).     tamsulosin 0.4 MG Caps capsule  Commonly known as:  FLOMAX  Take 1 capsule (0.4 mg total) by mouth daily after supper.     Vitamin D (Ergocalciferol) 50000 UNITS Caps capsule  Commonly known as:  DRISDOL  TAKE 1 CAPSULE BY MOUTH ONCE A WEEK        Meds ordered this encounter  Medications  .  amLODipine (NORVASC) 10 MG tablet    Sig: Take 1 tablet (10 mg total) by mouth daily.    Dispense:  30 tablet    Refill:  3  . lisinopril-hydrochlorothiazide (PRINZIDE,ZESTORETIC) 20-25 MG per tablet    Sig: Take 1 tablet by mouth daily.    Dispense:  30 tablet    Refill:  3  . tamsulosin (FLOMAX) 0.4 MG CAPS capsule    Sig: Take 1 capsule (0.4 mg total) by mouth daily after supper.    Dispense:  30 capsule    Refill:  3    Immunization History  Administered Date(s) Administered  . Influenza Split 07/18/2012  . Influenza,inj,Quad PF,36+ Mos 06/04/2014  . Td 01/27/2010    Family History  Problem Relation Age of Onset  . Stroke Mother     History  Substance Use Topics  . Smoking status: Former Research scientist (life sciences)  . Smokeless tobacco: Not on file  . Alcohol Use: No     Comment: hx heavy use per H&P 05/12/2011, none since 2003    Review of Systems   As noted in HPI  Filed Vitals:   02/23/15 0923  BP: 137/76  Pulse: 71  Temp: 98.8 F (37.1 C)  Resp: 16  Physical Exam  Physical Exam  Constitutional: No distress.  Eyes: EOM are normal. Pupils are equal, round, and reactive to light.  Cardiovascular: Normal rate and regular rhythm.   Pulmonary/Chest: Breath sounds normal. No respiratory distress. He has no wheezes. He has no rales.  Musculoskeletal: He exhibits no edema.    CBC    Component Value Date/Time   WBC 4.3 05/29/2013 1426   RBC 5.32 05/29/2013 1426   HGB 14.1 05/29/2013 1426   HCT 40.6 05/29/2013 1426   PLT 161 05/29/2013 1426   MCV 76.3* 05/29/2013 1426   LYMPHSABS 1.8 01/27/2010 2219   MONOABS 0.6 01/27/2010 2219   EOSABS 0.1 01/27/2010 2219   BASOSABS 0.0 01/27/2010 2219    CMP     Component Value Date/Time   NA 139 10/22/2014 1512   K 3.7 10/22/2014 1512   CL 103 10/22/2014 1512   CO2 24 10/22/2014 1512   GLUCOSE 105* 10/22/2014 1512   BUN 17 10/22/2014 1512   CREATININE 0.92 10/22/2014 1512   CREATININE 0.77 07/18/2012 1050   CALCIUM  9.2 10/22/2014 1512   PROT 7.1 10/22/2014 1512   ALBUMIN 4.3 10/22/2014 1512   AST 19 10/22/2014 1512   ALT 20 10/22/2014 1512   ALKPHOS 62 10/22/2014 1512   BILITOT 0.6 10/22/2014 1512   GFRNONAA >89 10/22/2014 1512   GFRNONAA >90 07/18/2012 1050   GFRAA >89 10/22/2014 1512   GFRAA >90 07/18/2012 1050    Lab Results  Component Value Date/Time   CHOL 156 07/18/2012 10:50 AM    No results found for: HGBA1C  Lab Results  Component Value Date/Time   AST 19 10/22/2014 03:12 PM    Assessment and Plan  HYPERTENSION, BENIGN ESSENTIAL - Plan: COMPLETE METABOLIC PANEL WITH GFR, amLODipine (NORVASC) 10 MG tablet, lisinopril-hydrochlorothiazide (PRINZIDE,ZESTORETIC) 20-25 MG per tablet  BPH (benign prostatic hyperplasia) - Plan: tamsulosin (FLOMAX) 0.4 MG CAPS capsule, follow with urology.  IFG (impaired fasting glucose) - Plan:advised patient for low carbohydrate diet  COMPLETE METABOLIC PANEL WITH GFR, Hemoglobin A1c   Return in about 3 months (around 05/26/2015), or if symptoms worsen or fail to improve.   This note has been created with Surveyor, quantity. Any transcriptional errors are unintentional.    Lorayne Marek, MD

## 2015-02-23 NOTE — Progress Notes (Signed)
Patient here for his three month routine follow up Patient has no complaints at this time

## 2015-02-24 ENCOUNTER — Telehealth: Payer: Self-pay

## 2015-02-24 NOTE — Telephone Encounter (Signed)
Patient not available Left message on voice mail to return our call 

## 2015-02-24 NOTE — Telephone Encounter (Signed)
Patient called returning nurse's call to review results  °

## 2015-02-24 NOTE — Telephone Encounter (Signed)
-----   Message from Lorayne Marek, MD sent at 02/24/2015 10:34 AM EDT ----- Call and let the Patient know that blood work is normal.

## 2015-02-24 NOTE — Telephone Encounter (Signed)
Returned patient phone call Patient not available Left message on voice mail to return our call 

## 2015-02-25 ENCOUNTER — Ambulatory Visit: Payer: Self-pay | Attending: Internal Medicine | Admitting: Internal Medicine

## 2015-02-26 NOTE — Progress Notes (Signed)
Patient was not seen, he was given the results from his recent visit

## 2015-03-24 ENCOUNTER — Ambulatory Visit: Payer: Self-pay | Attending: Internal Medicine

## 2015-05-13 ENCOUNTER — Encounter: Payer: Self-pay | Admitting: Internal Medicine

## 2015-05-13 ENCOUNTER — Ambulatory Visit: Payer: Self-pay | Attending: Internal Medicine | Admitting: Internal Medicine

## 2015-05-13 VITALS — BP 137/78 | HR 65 | Temp 97.8°F | Resp 16 | Ht 65.0 in | Wt 184.0 lb

## 2015-05-13 DIAGNOSIS — N400 Enlarged prostate without lower urinary tract symptoms: Secondary | ICD-10-CM

## 2015-05-13 DIAGNOSIS — I10 Essential (primary) hypertension: Secondary | ICD-10-CM | POA: Insufficient documentation

## 2015-05-13 DIAGNOSIS — Z0000 Encounter for general adult medical examination without abnormal findings: Secondary | ICD-10-CM

## 2015-05-13 MED ORDER — AMLODIPINE BESYLATE 10 MG PO TABS: 10.0000 mg | ORAL_TABLET | Freq: Every day | ORAL | Status: DC

## 2015-05-13 MED ORDER — TAMSULOSIN HCL 0.4 MG PO CAPS: 0.4000 mg | ORAL_CAPSULE | Freq: Every day | ORAL | Status: DC

## 2015-05-13 MED ORDER — LISINOPRIL-HYDROCHLOROTHIAZIDE 20-25 MG PO TABS: 1.0000 | ORAL_TABLET | Freq: Every day | ORAL | Status: DC

## 2015-05-13 NOTE — Progress Notes (Signed)
Patient here for follow up on his HTN and for medication refills Patient also needs his psa drawn at this visit and paperwork for labs is in the lab

## 2015-05-13 NOTE — Progress Notes (Signed)
Patient ID: Jeffery Graves, male   DOB: April 17, 1956, 59 y.o.   MRN: [id number] Subjective:  Jeffery Graves is a 59 y.o. male with hypertension, prostate cancer (4 years ago with radiation treatment), and BPH (currently followed by [facility]).  Current Outpatient Prescriptions  Medication Sig Dispense Refill  . amLODipine (NORVASC) 10 MG tablet Take 1 tablet (10 mg total) by mouth daily. 30 tablet 3  . lisinopril-hydrochlorothiazide (PRINZIDE,ZESTORETIC) 20-25 MG per tablet Take 1 tablet by mouth daily. 30 tablet 3  . tamsulosin (FLOMAX) 0.4 MG CAPS capsule Take 1 capsule (0.4 mg total) by mouth daily after supper. 30 capsule 3  . Artificial Tear Ointment (LUBRICANT EYE) OINT Apply 1 application to eye at bedtime. 1cm ribbon in lower conjunctiva of each eye daily at bed time. (Patient not taking: Reported on 05/13/2015) 1 Tube 0  . ketotifen (ZADITOR) 0.025 % ophthalmic solution Place 1 drop into both eyes 2 (two) times daily. (Patient not taking: Reported on 05/13/2015) 5 mL 0  . Polyethyl Glycol-Propyl Glycol 0.4-0.3 % SOLN Apply 2 drops to eye 4 (four) times daily as needed (en each eye). (Patient not taking: Reported on 05/13/2015) 1 Bottle 0  . Vitamin D, Ergocalciferol, (DRISDOL) 50000 UNITS CAPS capsule TAKE 1 CAPSULE BY MOUTH ONCE A WEEK (Patient not taking: Reported on 05/13/2015) 12 capsule 0   No current facility-administered medications for this visit.    Hypertension ROS: taking medications as instructed, no medication side effects noted, no TIA's, no chest pain on exertion, no dyspnea on exertion, no swelling of ankles and no palpitations.  New concerns: none  Objective:  BP 137/78 mmHg  Pulse 65  Temp(Src) 97.8 F (36.6 C)  Resp 16  Ht 5\' 5"  (1.651 m)  Wt 184 lb (83.462 kg)  BMI 30.62 kg/m2  SpO2 100%  Appearance alert, well appearing, and in no distress, oriented to person, place, and time and normal appearing weight. General exam BP noted to be well controlled today in  office, S1, S2 normal, no gallop, no murmur, chest clear, no JVD, no HSM, no edema.  Lab review: labs are reviewed, up to date and normal.   Assessment:   Hypertension well controlled.  BPH: continue Flomax and f/u with Urology. He has order form for PSA level to be drawn today from Urologist. Influenza vaccine received in office today.  Plan:  Current treatment plan is effective, no change in therapy. Reviewed diet, exercise and weight control. Recommended sodium restriction..  Return in about 6 months (around 11/11/2015) for Hypertension.  Lance Bosch, NP 05/13/2015 3:02 PM

## 2015-06-08 ENCOUNTER — Ambulatory Visit: Payer: Self-pay | Attending: Internal Medicine

## 2015-06-09 ENCOUNTER — Other Ambulatory Visit: Payer: Self-pay | Admitting: Urology

## 2015-06-09 DIAGNOSIS — R972 Elevated prostate specific antigen [PSA]: Secondary | ICD-10-CM

## 2015-06-22 ENCOUNTER — Ambulatory Visit (HOSPITAL_COMMUNITY)
Admission: RE | Admit: 2015-06-22 | Discharge: 2015-06-22 | Disposition: A | Discharge status: Home / Self Care | Payer: Self-pay | Source: Ambulatory Visit | Attending: Urology | Admitting: Urology

## 2015-06-22 ENCOUNTER — Encounter (HOSPITAL_COMMUNITY): Payer: Self-pay

## 2015-06-22 ENCOUNTER — Encounter (HOSPITAL_COMMUNITY)
Admission: RE | Admit: 2015-06-22 | Discharge: 2015-06-22 | Disposition: A | Discharge status: Home / Self Care | Payer: Self-pay | Source: Ambulatory Visit | Attending: Urology | Admitting: Urology

## 2015-06-22 ENCOUNTER — Other Ambulatory Visit: Payer: Self-pay | Admitting: Urology

## 2015-06-22 DIAGNOSIS — R972 Elevated prostate specific antigen [PSA]: Secondary | ICD-10-CM | POA: Insufficient documentation

## 2015-06-22 DIAGNOSIS — Z8546 Personal history of malignant neoplasm of prostate: Secondary | ICD-10-CM | POA: Insufficient documentation

## 2015-06-22 DIAGNOSIS — I7090 Unspecified atherosclerosis: Secondary | ICD-10-CM | POA: Insufficient documentation

## 2015-06-22 DIAGNOSIS — M898X8 Other specified disorders of bone, other site: Secondary | ICD-10-CM | POA: Insufficient documentation

## 2015-06-22 DIAGNOSIS — K429 Umbilical hernia without obstruction or gangrene: Secondary | ICD-10-CM | POA: Insufficient documentation

## 2015-06-22 MED ORDER — TECHNETIUM TC 99M MEDRONATE IV KIT
25.7000 | PACK | Freq: Once | INTRAVENOUS | Status: AC | PRN
  Administered 2015-06-22: 25.7 via INTRAVENOUS

## 2015-06-22 MED ORDER — IOHEXOL 300 MG/ML  SOLN
100.0000 mL | Freq: Once | INTRAMUSCULAR | Status: AC | PRN
  Administered 2015-06-22: 100 mL via INTRAVENOUS

## 2015-08-24 MED FILL — AMLODIPINE BESYLATE 10 MG T: 10 | 30 days supply | Qty: 30 | Fill #3

## 2015-08-24 MED FILL — TAMSULOSIN HCL 0.4 MG CAP: 0.4 | 30 days supply | Qty: 30 | Fill #3

## 2015-08-24 MED FILL — LISINOPRIL-HCTZ 20-25 MG TA: 20-25 | 30 days supply | Qty: 30 | Fill #3

## 2015-09-22 MED FILL — LISINOPRIL-HCTZ 20-25 MG TA: 20-25 | 30 days supply | Qty: 30 | Fill #2

## 2015-09-22 MED FILL — AMLODIPINE BESYLATE 10 MG T: 10 | 30 days supply | Qty: 30 | Fill #2

## 2015-09-22 MED FILL — TAMSULOSIN HCL 0.4 MG CAP: 0.4 | 30 days supply | Qty: 30 | Fill #2

## 2015-09-28 ENCOUNTER — Ambulatory Visit: Payer: Self-pay | Attending: Internal Medicine

## 2015-10-05 ENCOUNTER — Ambulatory Visit: Payer: Self-pay | Attending: Internal Medicine

## 2015-10-05 NOTE — Patient Instructions (Signed)
Patient informed of receiving a FU with results. Patient authorized a detail message of VM if he is not available will be fine.

## 2015-10-26 MED FILL — TAMSULOSIN HCL 0.4 MG CAP: 0.4 | 30 days supply | Qty: 30 | Fill #3

## 2015-10-26 MED FILL — LISINOPRIL-HCTZ 20-25 MG TA: 20-25 | 30 days supply | Qty: 30 | Fill #3

## 2015-10-26 MED FILL — AMLODIPINE BESYLATE 10 MG T: 10 | 30 days supply | Qty: 30 | Fill #3

## 2015-10-27 ENCOUNTER — Other Ambulatory Visit: Payer: Self-pay | Admitting: Internal Medicine

## 2015-11-04 ENCOUNTER — Ambulatory Visit: Payer: Self-pay | Attending: Internal Medicine | Admitting: Internal Medicine

## 2015-11-04 ENCOUNTER — Encounter: Payer: Self-pay | Admitting: Internal Medicine

## 2015-11-04 ENCOUNTER — Encounter: Payer: Self-pay | Admitting: Clinical

## 2015-11-04 VITALS — BP 123/80 | HR 72 | Temp 98.4°F | Resp 16 | Ht 65.0 in | Wt 191.6 lb

## 2015-11-04 DIAGNOSIS — Z79899 Other long term (current) drug therapy: Secondary | ICD-10-CM | POA: Insufficient documentation

## 2015-11-04 DIAGNOSIS — J309 Allergic rhinitis, unspecified: Secondary | ICD-10-CM

## 2015-11-04 DIAGNOSIS — I10 Essential (primary) hypertension: Secondary | ICD-10-CM

## 2015-11-04 DIAGNOSIS — R7989 Other specified abnormal findings of blood chemistry: Secondary | ICD-10-CM

## 2015-11-04 MED ORDER — LISINOPRIL-HYDROCHLOROTHIAZIDE 20-25 MG PO TABS: 1.0000 | ORAL_TABLET | Freq: Every day | ORAL | Status: DC

## 2015-11-04 MED ORDER — FLUTICASONE PROPIONATE 50 MCG/ACT NA SUSP: 2.0000 | Freq: Every day | NASAL | Status: DC

## 2015-11-04 MED ORDER — AMLODIPINE BESYLATE 10 MG PO TABS: 10.0000 mg | ORAL_TABLET | Freq: Every day | ORAL | Status: DC

## 2015-11-04 MED FILL — LISINOPRIL-HCTZ 20-25 MG TA: 20-25 | 30 days supply | Qty: 30 | Fill #0

## 2015-11-04 MED FILL — FLUTICASONE PROP 50 MCG SPR: 50 | 30 days supply | Qty: 16 | Fill #0

## 2015-11-04 NOTE — Progress Notes (Signed)
Depression screen [facility] 2/9 11/04/2015  Decreased Interest 0  Down, Depressed, Hopeless 0  PHQ - 2 Score 0  Altered sleeping 0  Tired, decreased energy 0  Change in appetite 0  Feeling bad or failure about yourself  0  Trouble concentrating 3  Moving slowly or fidgety/restless 0  Suicidal thoughts 0  PHQ-9 Score 3    GAD 7 : Generalized Anxiety Score 11/04/2015  Nervous, Anxious, on Edge 0  Control/stop worrying 0  Worry too much - different things 0  Trouble relaxing 0  Restless 0  Easily annoyed or irritable 0  Afraid - awful might happen 0  Total GAD 7 Score 0

## 2015-11-04 NOTE — Progress Notes (Signed)
Patient ID: Jeffery Graves, male   DOB: 14-Oct-1955, 60 y.o.   MRN: [id number] Subjective:  Jeffery Graves is a 60 y.o. male with hypertension. Past medical history of prostate cancer and BPH currently managed by [facility].  Current Outpatient Prescriptions  Medication Sig Dispense Refill  . amLODipine (NORVASC) 10 MG tablet Take 1 tablet (10 mg total) by mouth daily. 30 tablet 5  . ketotifen (ZADITOR) 0.025 % ophthalmic solution Place 1 drop into both eyes 2 (two) times daily. 5 mL 0  . lisinopril-hydrochlorothiazide (PRINZIDE,ZESTORETIC) 20-25 MG tablet Take 1 tablet by mouth daily. 30 tablet 5  . Artificial Tear Ointment (LUBRICANT EYE) OINT Apply 1 application to eye at bedtime. 1cm ribbon in lower conjunctiva of each eye daily at bed time. (Patient not taking: Reported on 11/04/2015) 1 Tube 0  . fluticasone (FLONASE) 50 MCG/ACT nasal spray Place 2 sprays into both nostrils daily. 16 g 6  . Polyethyl Glycol-Propyl Glycol 0.4-0.3 % SOLN Apply 2 drops to eye 4 (four) times daily as needed (en each eye). (Patient not taking: Reported on 05/13/2015) 1 Bottle 0  . tamsulosin (FLOMAX) 0.4 MG CAPS capsule TAKE 1 CAPSULE BY MOUTH DAILY AFTER SUPPER. (Patient not taking: Reported on 11/04/2015) 30 capsule 3   No current facility-administered medications for this visit.    Hypertension ROS: taking medications as instructed, no medication side effects noted, no TIA's, no chest pain on exertion, no dyspnea on exertion, no swelling of ankles and no palpitations. + nasal congestion, sneezing. All other systems negative other than what is stated.  Objective:  BP 123/80 mmHg  Pulse 72  Temp(Src) 98.4 F (36.9 C) (Oral)  Resp 16  Ht 5\' 5"  (1.651 m)  Wt 191 lb 9.6 oz (86.909 kg)  BMI 31.88 kg/m2  SpO2 97%  Appearance alert, well appearing, and in no distress, oriented to person, place, and time and normal appearing weight. General exam BP noted to be well controlled today in office, S1, S2 normal, no  gallop, no murmur, chest clear, no JVD, no HSM, no edema.  Lab review: will return for lab test in 1 week. Lipid panel and BMET.  Assessment:   Jeffery Graves was seen today for follow-up.  Diagnoses and all orders for this visit:  Essential hypertension -     amLODipine (NORVASC) 10 MG tablet; Take 1 tablet (10 mg total) by mouth daily. -     lisinopril-hydrochlorothiazide (PRINZIDE,ZESTORETIC) 20-25 MG tablet; Take 1 tablet by mouth daily. -     Lipid panel; Future -     Basic Metabolic Panel; Future Patient blood pressure is stable and may continue on current medication.  Education on diet, exercise, and modifiable risk factors discussed. Will obtain appropriate labs as needed. Will follow up in 3-6 months.   Low serum vitamin D -     Vitamin D, 25-hydroxy; Future Will recheck levels   Allergic rhinitis, unspecified allergic rhinitis type -    Begin fluticasone (FLONASE) 50 MCG/ACT nasal spray; Place 2 sprays into both nostrils daily.   Return in about 1 week (around 11/11/2015) for Lab Visit and 6 mo PCP HTN.  Lance Bosch, NP 11/04/2015 11:09 AM

## 2015-11-04 NOTE — Progress Notes (Signed)
Patient's here for 6 months f/up.  Patient states that he feels good today.  Patient requesting med refills.  Patient requesting new Rx for flonase.

## 2015-11-05 MED FILL — ?AMLODIPINE BESYLATE 10 MG: 10 | 30 days supply | Qty: 30 | Fill #0

## 2015-11-11 ENCOUNTER — Ambulatory Visit: Payer: Self-pay | Attending: Internal Medicine

## 2015-11-11 DIAGNOSIS — I10 Essential (primary) hypertension: Secondary | ICD-10-CM

## 2015-11-11 DIAGNOSIS — R7989 Other specified abnormal findings of blood chemistry: Secondary | ICD-10-CM

## 2015-11-11 LAB — BASIC METABOLIC PANEL
BUN: 14 mg/dL (ref 7–25)
CALCIUM: 8.8 mg/dL (ref 8.6–10.3)
CHLORIDE: 102 mmol/L (ref 98–110)
CO2: 27 mmol/L (ref 20–31)
Creat: 0.84 mg/dL (ref 0.70–1.33)
GLUCOSE: 92 mg/dL (ref 65–99)
Potassium: 4.3 mmol/L (ref 3.5–5.3)
SODIUM: 137 mmol/L (ref 135–146)

## 2015-11-11 LAB — LIPID PANEL
Cholesterol: 153 mg/dL (ref 125–200)
HDL: 74 mg/dL (ref >=40)
LDL Cholesterol: 72 mg/dL (ref <130)
Total CHOL/HDL Ratio: 2.1 Ratio (ref <=5.0)
Triglycerides: 35 mg/dL (ref <150)
VLDL: 7 mg/dL (ref <30)

## 2015-11-12 LAB — VITAMIN D 25 HYDROXY (VIT D DEFICIENCY, FRACTURES): VIT D 25 HYDROXY: 29 ng/mL — AB (ref 30–100)

## 2015-11-16 ENCOUNTER — Telehealth: Payer: Self-pay

## 2015-11-16 NOTE — Telephone Encounter (Signed)
-----   Message from Lance Bosch, NP sent at 11/12/2015  9:33 PM EDT ----- Get OTC Vitamin D 800 IU to take daily

## 2015-11-16 NOTE — Telephone Encounter (Signed)
CMA called patient, patient did not answer. A message for the patient to return my call was left.  Note to patient: ----- Message from Lance Bosch, NP sent at 11/12/2015 9:33 PM EDT -----  Get OTC Vitamin D 800 IU to take daily

## 2015-11-17 ENCOUNTER — Ambulatory Visit: Payer: Self-pay | Admitting: Family Medicine

## 2015-11-17 ENCOUNTER — Telehealth: Payer: Self-pay | Admitting: Internal Medicine

## 2015-11-17 NOTE — Telephone Encounter (Signed)
Patient informed of lab results. 

## 2015-11-23 MED FILL — TAMSULOSIN HCL 0.4 MG CAP: 0.4 | 30 days supply | Qty: 30 | Fill #0

## 2015-11-24 ENCOUNTER — Ambulatory Visit: Payer: Self-pay | Attending: Family Medicine | Admitting: Family Medicine

## 2015-11-24 ENCOUNTER — Telehealth: Payer: Self-pay

## 2015-11-24 ENCOUNTER — Encounter: Payer: Self-pay | Admitting: Family Medicine

## 2015-11-24 VITALS — BP 113/72 | HR 71 | Temp 98.3°F | Resp 18 | Ht 66.0 in | Wt 190.2 lb

## 2015-11-24 DIAGNOSIS — H9201 Otalgia, right ear: Secondary | ICD-10-CM | POA: Insufficient documentation

## 2015-11-24 DIAGNOSIS — H6000 Abscess of external ear, unspecified ear: Secondary | ICD-10-CM

## 2015-11-24 DIAGNOSIS — Z79899 Other long term (current) drug therapy: Secondary | ICD-10-CM | POA: Insufficient documentation

## 2015-11-24 NOTE — Telephone Encounter (Signed)
Spoke with patient, patient verified name and DOB. Patient was given lab results verbalize understanding with no further questions.

## 2015-11-24 NOTE — Progress Notes (Signed)
Subjective:  Patient ID: Jeffery Graves, male    DOB: 1956/05/20  Age: 60 y.o. MRN: [id number]  CC: Otalgia   HPI Jeffery Graves is a 60 year old male with history of hypertension who comes into the clinic for right ear lump and pain which she had a week ago and has resolved at this time. At the time he had a bump in the right heel with some purulent discharge which has resolved. He denies hearing loss or otalgia and has no fever.  Outpatient Prescriptions Prior to Visit  Medication Sig Dispense Refill  . amLODipine (NORVASC) 10 MG tablet Take 1 tablet (10 mg total) by mouth daily. 30 tablet 5  . fluticasone (FLONASE) 50 MCG/ACT nasal spray Place 2 sprays into both nostrils daily. 16 g 6  . ketotifen (ZADITOR) 0.025 % ophthalmic solution Place 1 drop into both eyes 2 (two) times daily. 5 mL 0  . lisinopril-hydrochlorothiazide (PRINZIDE,ZESTORETIC) 20-25 MG tablet Take 1 tablet by mouth daily. 30 tablet 5  . Artificial Tear Ointment (LUBRICANT EYE) OINT Apply 1 application to eye at bedtime. 1cm ribbon in lower conjunctiva of each eye daily at bed time. (Patient not taking: Reported on 11/04/2015) 1 Tube 0  . Polyethyl Glycol-Propyl Glycol 0.4-0.3 % SOLN Apply 2 drops to eye 4 (four) times daily as needed (en each eye). (Patient not taking: Reported on 05/13/2015) 1 Bottle 0  . tamsulosin (FLOMAX) 0.4 MG CAPS capsule TAKE 1 CAPSULE BY MOUTH DAILY AFTER SUPPER. (Patient not taking: Reported on 11/04/2015) 30 capsule 3   No facility-administered medications prior to visit.    ROS Review of Systems  Constitutional: Negative for activity change and appetite change.  HENT: Negative for ear discharge, ear pain, sinus pressure and sore throat.   Respiratory: Negative for chest tightness, shortness of breath and wheezing.   Cardiovascular: Negative for chest pain and palpitations.  Gastrointestinal: Negative for abdominal pain, constipation and abdominal distention.  Genitourinary: Negative.     Musculoskeletal: Negative.   Psychiatric/Behavioral: Negative for behavioral problems and dysphoric mood.    Objective:  BP 113/72 mmHg  Pulse 71  Temp(Src) 98.3 F (36.8 C) (Oral)  Resp 18  Ht 5\' 6"  (1.676 m)  Wt 190 lb 3.2 oz (86.274 kg)  BMI 30.71 kg/m2  SpO2 97%  BP/Weight 11/24/2015 11/04/2015 0000000  Systolic BP [id number]  Diastolic BP 72 80 78  Wt. (Lbs) 190.2 191.6 184  BMI 30.71 31.88 30.62      Physical Exam  Constitutional: He is oriented to person, place, and time. He appears well-developed and well-nourished.  HENT:  Left Ear: External ear normal.  Right ear with no nontender lump in the ear lobe, no discharge. Cerumen obscuring both TMs No pre trageal tenderness  Cardiovascular: Normal rate, normal heart sounds and intact distal pulses.   No murmur heard. Pulmonary/Chest: Effort normal and breath sounds normal. He has no wheezes. He has no rales. He exhibits no tenderness.  Abdominal: Soft. Bowel sounds are normal. He exhibits no distension and no mass. There is no tenderness.  Musculoskeletal: Normal range of motion.  Neurological: He is alert and oriented to person, place, and time.     Assessment & Plan:   1. Furuncle of ear Advised to apply warm compress to right earlobe. No evidence of infection at this time and no indication for antibiotics.   No orders of the defined types were placed in this encounter.    Follow-up: Return in about 6 months (around  05/25/2016) for Follow up on Hypertension.   Arnoldo Morale MD

## 2015-11-24 NOTE — Telephone Encounter (Signed)
-----   Message from Lance Bosch, NP sent at 11/12/2015  9:33 PM EDT ----- Get OTC Vitamin D 800 IU to take daily

## 2015-11-24 NOTE — Progress Notes (Signed)
Patient's here for R ear pain.  Patient states that pain in his ear has since subsided from last week.

## 2015-12-23 MED FILL — TAMSULOSIN HCL 0.4 MG CAP: 0.4 | 30 days supply | Qty: 30 | Fill #1

## 2015-12-23 MED FILL — LISINOPRIL-HCTZ 20-25 MG TA: 20-25 | 30 days supply | Qty: 30 | Fill #1

## 2015-12-23 MED FILL — AMLODIPINE BESYLATE 10 MG T: 10 | 30 days supply | Qty: 30 | Fill #0

## 2015-12-23 MED FILL — FLUTICASONE PROP 50 MCG SPR: 50 | 30 days supply | Qty: 16 | Fill #1

## 2016-01-25 MED FILL — LISINOPRIL-HCTZ 20-25 MG TA: 20-25 | 30 days supply | Qty: 30 | Fill #2

## 2016-01-25 MED FILL — AMLODIPINE BESYLATE 10 MG T: 10 | 30 days supply | Qty: 30 | Fill #1

## 2016-01-25 MED FILL — ?TAMSULOSIN HCL 0.4 MG CAP: 0.4 | 30 days supply | Qty: 30 | Fill #2

## 2016-02-29 MED FILL — ?TAMSULOSIN HCL 0.4 MG CAP: 0.4 | 30 days supply | Qty: 30 | Fill #3

## 2016-02-29 MED FILL — LISINOPRIL-HCTZ 20-25 MG TA: 20-25 | 30 days supply | Qty: 30 | Fill #3

## 2016-02-29 MED FILL — AMLODIPINE BESYLATE 10 MG T: 10 | 30 days supply | Qty: 30 | Fill #2

## 2016-03-28 ENCOUNTER — Ambulatory Visit: Payer: Self-pay | Attending: Family Medicine | Admitting: Family Medicine

## 2016-03-28 ENCOUNTER — Ambulatory Visit: Payer: Self-pay

## 2016-03-28 ENCOUNTER — Encounter: Payer: Self-pay | Admitting: Family Medicine

## 2016-03-28 VITALS — BP 136/75 | HR 67 | Temp 98.0°F | Ht 66.0 in | Wt 194.2 lb

## 2016-03-28 DIAGNOSIS — I10 Essential (primary) hypertension: Secondary | ICD-10-CM | POA: Insufficient documentation

## 2016-03-28 DIAGNOSIS — J309 Allergic rhinitis, unspecified: Secondary | ICD-10-CM | POA: Insufficient documentation

## 2016-03-28 DIAGNOSIS — N400 Enlarged prostate without lower urinary tract symptoms: Secondary | ICD-10-CM | POA: Insufficient documentation

## 2016-03-28 DIAGNOSIS — Z79899 Other long term (current) drug therapy: Secondary | ICD-10-CM | POA: Insufficient documentation

## 2016-03-28 DIAGNOSIS — C61 Malignant neoplasm of prostate: Secondary | ICD-10-CM | POA: Insufficient documentation

## 2016-03-28 LAB — BASIC METABOLIC PANEL
BUN: 20 mg/dL (ref 7–25)
CHLORIDE: 102 mmol/L (ref 98–110)
CO2: 28 mmol/L (ref 20–31)
CREATININE: 0.96 mg/dL (ref 0.70–1.33)
Calcium: 9.2 mg/dL (ref 8.6–10.3)
Glucose, Bld: 89 mg/dL (ref 65–99)
POTASSIUM: 4 mmol/L (ref 3.5–5.3)
SODIUM: 138 mmol/L (ref 135–146)

## 2016-03-28 MED ORDER — LISINOPRIL-HYDROCHLOROTHIAZIDE 20-25 MG PO TABS: 1.0000 | ORAL_TABLET | Freq: Every day | ORAL | 5 refills | Status: DC

## 2016-03-28 MED ORDER — FLUTICASONE PROPIONATE 50 MCG/ACT NA SUSP: 2.0000 | Freq: Every day | NASAL | 6 refills | Status: DC

## 2016-03-28 MED ORDER — AMLODIPINE BESYLATE 10 MG PO TABS: 10.0000 mg | ORAL_TABLET | Freq: Every day | ORAL | 5 refills | Status: DC

## 2016-03-28 MED ORDER — TAMSULOSIN HCL 0.4 MG PO CAPS: ORAL_CAPSULE | ORAL | 5 refills | Status: DC

## 2016-03-28 MED FILL — LISINOPRIL-HCTZ 20-25 MG TA: 20-25 | 30 days supply | Qty: 30 | Fill #0

## 2016-03-28 MED FILL — AMLODIPINE BESYLATE 10 MG T: 10 | 30 days supply | Qty: 30 | Fill #3

## 2016-03-28 MED FILL — ?TAMSULOSIN HCL 0.4 MG CAP: 0.4 | 30 days supply | Qty: 30 | Fill #0

## 2016-03-28 MED FILL — FLUTICASONE PROP 50 MCG SPR: 50 | 30 days supply | Qty: 16 | Fill #2

## 2016-03-28 NOTE — Patient Instructions (Signed)
Hypertension Hypertension, commonly called high blood pressure, is when the force of blood pumping through your arteries is too strong. Your arteries are the blood vessels that carry blood from your heart throughout your body. A blood pressure reading consists of a higher number over a lower number, such as 110/72. The higher number (systolic) is the pressure inside your arteries when your heart pumps. The lower number (diastolic) is the pressure inside your arteries when your heart relaxes. Ideally you want your blood pressure below 120/80. Hypertension forces your heart to work harder to pump blood. Your arteries may become narrow or stiff. Having untreated or uncontrolled hypertension can cause heart attack, stroke, kidney disease, and other problems. RISK FACTORS Some risk factors for high blood pressure are controllable. Others are not.  Risk factors you cannot control include:   Race. You may be at higher risk if you are African American.  Age. Risk increases with age.  Gender. Men are at higher risk than women before age 45 years. After age 65, women are at higher risk than men. Risk factors you can control include:  Not getting enough exercise or physical activity.  Being overweight.  Getting too much fat, sugar, calories, or salt in your diet.  Drinking too much alcohol. SIGNS AND SYMPTOMS Hypertension does not usually cause signs or symptoms. Extremely high blood pressure (hypertensive crisis) may cause headache, anxiety, shortness of breath, and nosebleed. DIAGNOSIS To check if you have hypertension, your health care provider will measure your blood pressure while you are seated, with your arm held at the level of your heart. It should be measured at least twice using the same arm. Certain conditions can cause a difference in blood pressure between your right and left arms. A blood pressure reading that is higher than normal on one occasion does not mean that you need treatment. If  it is not clear whether you have high blood pressure, you may be asked to return on a different day to have your blood pressure checked again. Or, you may be asked to monitor your blood pressure at home for 1 or more weeks. TREATMENT Treating high blood pressure includes making lifestyle changes and possibly taking medicine. Living a healthy lifestyle can help lower high blood pressure. You may need to change some of your habits. Lifestyle changes may include:  Following the DASH diet. This diet is high in fruits, vegetables, and whole grains. It is low in salt, red meat, and added sugars.  Keep your sodium intake below 2,300 mg per day.  Getting at least 30-45 minutes of aerobic exercise at least 4 times per week.  Losing weight if necessary.  Not smoking.  Limiting alcoholic beverages.  Learning ways to reduce stress. Your health care provider may prescribe medicine if lifestyle changes are not enough to get your blood pressure under control, and if one of the following is true:  You are 18-59 years of age and your systolic blood pressure is above 140.  You are 60 years of age or older, and your systolic blood pressure is above 150.  Your diastolic blood pressure is above 90.  You have diabetes, and your systolic blood pressure is over 140 or your diastolic blood pressure is over 90.  You have kidney disease and your blood pressure is above 140/90.  You have heart disease and your blood pressure is above 140/90. Your personal target blood pressure may vary depending on your medical conditions, your age, and other factors. HOME CARE INSTRUCTIONS    Have your blood pressure rechecked as directed by your health care provider.   Take medicines only as directed by your health care provider. Follow the directions carefully. Blood pressure medicines must be taken as prescribed. The medicine does not work as well when you skip doses. Skipping doses also puts you at risk for  problems.  Do not smoke.   Monitor your blood pressure at home as directed by your health care provider. SEEK MEDICAL CARE IF:   You think you are having a reaction to medicines taken.  You have recurrent headaches or feel dizzy.  You have swelling in your ankles.  You have trouble with your vision. SEEK IMMEDIATE MEDICAL CARE IF:  You develop a severe headache or confusion.  You have unusual weakness, numbness, or feel faint.  You have severe chest or abdominal pain.  You vomit repeatedly.  You have trouble breathing. MAKE SURE YOU:   Understand these instructions.  Will watch your condition.  Will get help right away if you are not doing well or get worse.   This information is not intended to replace advice given to you by your health care provider. Make sure you discuss any questions you have with your health care provider.   Document Released: 07/18/2005 Document Revised: 12/02/2014 Document Reviewed: 05/10/2013 Elsevier Interactive Patient Education 2016 [employer] Inc[employer]  

## 2016-03-28 NOTE — Progress Notes (Signed)
Subjective:  Patient ID: Jeffery Graves, male    DOB: 05/11/1956  Age: 60 y.o. MRN: [id number]  CC: Hypertension   HPI Jeffery Graves is a 60 year old male with a history of Hypertension, history of Adenocarcinoma of the prostate (s/p treatment), Benign prostate hypertrophy, allergic rhinitis who comes into the clinic for a follow up visit.  He is requesting a basic metabolic panel which was requested by [facility] against an upcoming appointment on 04/11/16.  He is up to date on his colonoscopy and has been compliant with his medications. He has no complaints today. Requests refills today.  Past Medical History:  Diagnosis Date  . BPH (benign prostatic hyperplasia)   . Hx of radiation therapy 09/06/10 - 10/29/10   prostate, seminal vesicles  . Hypertension   . Prostate cancer ([place]) 04/26/2010   gleason 7, vol 20.88 cc    History reviewed. No pertinent surgical history.  No Known Allergies   Outpatient Medications Prior to Visit  Medication Sig Dispense Refill  . Artificial Tear Ointment (LUBRICANT EYE) OINT Apply 1 application to eye at bedtime. 1cm ribbon in lower conjunctiva of each eye daily at bed time. 1 Tube 0  . amLODipine (NORVASC) 10 MG tablet Take 1 tablet (10 mg total) by mouth daily. 30 tablet 5  . fluticasone (FLONASE) 50 MCG/ACT nasal spray Place 2 sprays into both nostrils daily. 16 g 6  . lisinopril-hydrochlorothiazide (PRINZIDE,ZESTORETIC) 20-25 MG tablet Take 1 tablet by mouth daily. 30 tablet 5  . tamsulosin (FLOMAX) 0.4 MG CAPS capsule TAKE 1 CAPSULE BY MOUTH DAILY AFTER SUPPER. 30 capsule 3  . ketotifen (ZADITOR) 0.025 % ophthalmic solution Place 1 drop into both eyes 2 (two) times daily. (Patient not taking: Reported on 03/28/2016) 5 mL 0  . Polyethyl Glycol-Propyl Glycol 0.4-0.3 % SOLN Apply 2 drops to eye 4 (four) times daily as needed (en each eye). (Patient not taking: Reported on 05/13/2015) 1 Bottle 0   No facility-administered medications prior to  visit.     ROS Review of Systems  Constitutional: Negative for activity change and appetite change.  HENT: Negative for sinus pressure and sore throat.   Eyes: Negative for visual disturbance.  Respiratory: Negative for cough, chest tightness and shortness of breath.   Cardiovascular: Negative for chest pain and leg swelling.  Gastrointestinal: Negative for abdominal distention, abdominal pain, constipation and diarrhea.  Endocrine: Negative.   Genitourinary: Negative for dysuria.  Musculoskeletal: Negative for joint swelling and myalgias.  Skin: Negative for rash.  Allergic/Immunologic: Negative.   Neurological: Negative for weakness, light-headedness and numbness.  Psychiatric/Behavioral: Negative for dysphoric mood and suicidal ideas.    Objective:  BP 136/75 (BP Location: Right Arm, Patient Position: Sitting, Cuff Size: Large)   Pulse 67   Temp 98 F (36.7 C) (Oral)   Ht 5\' 6"  (1.676 m)   Wt 194 lb 3.2 oz (88.1 kg)   SpO2 98%   BMI 31.34 kg/m   BP/Weight 03/28/2016 [id number]  Systolic BP [id number]  Diastolic BP 75 72 80  Wt. (Lbs) 194.2 190.2 191.6  BMI 31.34 30.71 31.88      Physical Exam  Constitutional: He is oriented to person, place, and time. He appears well-developed and well-nourished.  Cardiovascular: Normal rate, normal heart sounds and intact distal pulses.   No murmur heard. Pulmonary/Chest: Effort normal and breath sounds normal. He has no wheezes. He has no rales. He exhibits no tenderness.  Abdominal: Soft. Bowel sounds are  normal. He exhibits no distension and no mass. There is no tenderness.  Musculoskeletal: Normal range of motion.  Neurological: He is alert and oriented to person, place, and time.     Assessment & Plan:   1. Essential hypertension Controlled - lisinopril-hydrochlorothiazide (PRINZIDE,ZESTORETIC) 20-25 MG tablet; Take 1 tablet by mouth daily.  Dispense: 30 tablet; Refill: 5 - amLODipine (NORVASC) 10 MG tablet;  Take 1 tablet (10 mg total) by mouth daily.  Dispense: 30 tablet; Refill: 5 - Basic Metabolic Panel  2. Allergic rhinitis, unspecified allergic rhinitis type - fluticasone (FLONASE) 50 MCG/ACT nasal spray; Place 2 sprays into both nostrils daily.  Dispense: 16 g; Refill: 6  3. ADENOCARCINOMA, PROSTATE, GLEASON GRADE 6 S/p treatment Followed by Urology  4. BPH (benign prostatic hyperplasia) - tamsulosin (FLOMAX) 0.4 MG CAPS capsule; TAKE 1 CAPSULE BY MOUTH DAILY AFTER SUPPER.  Dispense: 30 capsule; Refill: 5   Meds ordered this encounter  Medications  . lisinopril-hydrochlorothiazide (PRINZIDE,ZESTORETIC) 20-25 MG tablet    Sig: Take 1 tablet by mouth daily.    Dispense:  30 tablet    Refill:  5  . fluticasone (FLONASE) 50 MCG/ACT nasal spray    Sig: Place 2 sprays into both nostrils daily.    Dispense:  16 g    Refill:  6  . tamsulosin (FLOMAX) 0.4 MG CAPS capsule    Sig: TAKE 1 CAPSULE BY MOUTH DAILY AFTER SUPPER.    Dispense:  30 capsule    Refill:  5  . amLODipine (NORVASC) 10 MG tablet    Sig: Take 1 tablet (10 mg total) by mouth daily.    Dispense:  30 tablet    Refill:  5    Follow-up: Return in about 6 months (around 09/28/2016) for Follow-up on hypertension.   Arnoldo Morale MD

## 2016-03-30 ENCOUNTER — Telehealth: Payer: Self-pay

## 2016-03-30 NOTE — Telephone Encounter (Signed)
Pt was called on 8/30 no answer and no voicemail set up.

## 2016-04-05 ENCOUNTER — Telehealth: Payer: Self-pay

## 2016-04-05 NOTE — Telephone Encounter (Signed)
Pt was called again on 9/5 and no answer and no voicemail, pt lab results will be mailed out 9/5.

## 2016-04-11 ENCOUNTER — Telehealth: Payer: Self-pay | Admitting: Family Medicine

## 2016-04-11 NOTE — Telephone Encounter (Signed)
-----   Message from Arnoldo Morale, MD sent at 03/29/2016  5:11 PM EDT ----- Please inform the patient that labs are normal. Thank you.

## 2016-04-11 NOTE — Telephone Encounter (Signed)
Writer called patient with normal lab results per Dr. Jarold Song.  Patient thought he was having a PSA drawn.  MD's notes indicated that patient was to have a BMP.  Patient stated understanding.

## 2016-04-11 NOTE — Telephone Encounter (Signed)
Faxed lab results- 03/28/16 BMP to [facility] per patient and Urology request.  Fax# [phone] Att: Otila Kluver

## 2016-04-11 NOTE — Telephone Encounter (Signed)
Dr. Noland Fordyce office, [facility] Specialist, requesting the PSA results of pt States they have consent from pt to release results  States results are needed as soon as possible   CB# [phone]

## 2016-05-03 MED FILL — LISINOPRIL-HCTZ 20-25 MG TA: 20-25 | 30 days supply | Qty: 30 | Fill #1

## 2016-05-03 MED FILL — AMLODIPINE BESYLATE 10 MG T: 10 | 30 days supply | Qty: 30 | Fill #4

## 2016-05-03 MED FILL — TAMSULOSIN HCL 0.4 MG CAP: 0.4 | 30 days supply | Qty: 30 | Fill #1

## 2016-06-07 MED FILL — ?TAMSULOSIN HCL 0.4 MG CAP: 0.4 | 30 days supply | Qty: 30 | Fill #2

## 2016-06-07 MED FILL — LISINOPRIL-HCTZ 20-25 MG TA: 20-25 | 30 days supply | Qty: 30 | Fill #2

## 2016-06-07 MED FILL — ?AMLODIPINE BESYLATE 10 MG: 10 | 30 days supply | Qty: 30 | Fill #5

## 2016-07-04 MED FILL — AMLODIPINE BESYLATE 10 MG T: 10 | 30 days supply | Qty: 30 | Fill #0

## 2016-07-04 MED FILL — ?TAMSULOSIN HCL 0.4 MG CAP: 0.4 | 30 days supply | Qty: 30 | Fill #3

## 2016-07-04 MED FILL — LISINOPRIL-HCTZ 20-25 MG TA: 20-25 | 30 days supply | Qty: 30 | Fill #3

## 2016-07-12 ENCOUNTER — Ambulatory Visit: Payer: Self-pay | Attending: Internal Medicine

## 2016-08-08 MED FILL — ?TAMSULOSIN HCL 0.4 MG CAP: 0.4 | 30 days supply | Qty: 30 | Fill #4

## 2016-08-08 MED FILL — AMLODIPINE BESYLATE 10 MG T: 10 | 30 days supply | Qty: 30 | Fill #1

## 2016-08-08 MED FILL — LISINOPRIL-HCTZ 20-25 MG TA: 20-25 | 30 days supply | Qty: 30 | Fill #4

## 2016-09-12 MED FILL — LISINOPRIL-HCTZ 20-25 MG TA: 20-25 | 30 days supply | Qty: 30 | Fill #5

## 2016-09-12 MED FILL — TAMSULOSIN HCL 0.4 MG CAP: 0.4 | 30 days supply | Qty: 30 | Fill #5

## 2016-09-12 MED FILL — AMLODIPINE BESYLATE 10 MG T: 10 | 30 days supply | Qty: 30 | Fill #2

## 2016-09-12 MED FILL — FLUTICASONE PROP 50 MCG SPR: 50 | 20 days supply | Qty: 16 | Fill #0

## 2016-10-17 ENCOUNTER — Other Ambulatory Visit: Payer: Self-pay | Admitting: Family Medicine

## 2016-10-17 DIAGNOSIS — N400 Enlarged prostate without lower urinary tract symptoms: Secondary | ICD-10-CM

## 2016-10-17 MED FILL — LISINOPRIL-HCTZ 20-25 MG TA: 20-25 | 30 days supply | Qty: 30 | Fill #4

## 2016-10-17 MED FILL — AMLODIPINE BESYLATE 10 MG T: 10 | 30 days supply | Qty: 30 | Fill #3

## 2016-10-18 MED FILL — TAMSULOSIN HCL 0.4 MG CAP: 0.4 | 30 days supply | Qty: 30 | Fill #0

## 2016-11-22 ENCOUNTER — Encounter: Payer: Self-pay | Admitting: Family Medicine

## 2016-11-22 ENCOUNTER — Other Ambulatory Visit: Payer: Self-pay | Admitting: Family Medicine

## 2016-11-22 ENCOUNTER — Ambulatory Visit: Payer: Self-pay | Attending: Family Medicine | Admitting: Family Medicine

## 2016-11-22 VITALS — BP 143/74 | HR 75 | Temp 98.2°F | Ht 66.0 in | Wt 203.8 lb

## 2016-11-22 DIAGNOSIS — N400 Enlarged prostate without lower urinary tract symptoms: Secondary | ICD-10-CM

## 2016-11-22 DIAGNOSIS — C61 Malignant neoplasm of prostate: Secondary | ICD-10-CM

## 2016-11-22 DIAGNOSIS — Z8546 Personal history of malignant neoplasm of prostate: Secondary | ICD-10-CM | POA: Insufficient documentation

## 2016-11-22 DIAGNOSIS — Z1211 Encounter for screening for malignant neoplasm of colon: Secondary | ICD-10-CM

## 2016-11-22 DIAGNOSIS — I10 Essential (primary) hypertension: Secondary | ICD-10-CM | POA: Insufficient documentation

## 2016-11-22 DIAGNOSIS — Z79899 Other long term (current) drug therapy: Secondary | ICD-10-CM | POA: Insufficient documentation

## 2016-11-22 DIAGNOSIS — Z923 Personal history of irradiation: Secondary | ICD-10-CM | POA: Insufficient documentation

## 2016-11-22 DIAGNOSIS — Z1159 Encounter for screening for other viral diseases: Secondary | ICD-10-CM

## 2016-11-22 MED ORDER — LISINOPRIL-HYDROCHLOROTHIAZIDE 20-25 MG PO TABS: 1.0000 | ORAL_TABLET | Freq: Every day | ORAL | 6 refills | Status: DC

## 2016-11-22 MED ORDER — TAMSULOSIN HCL 0.4 MG PO CAPS: ORAL_CAPSULE | ORAL | 6 refills | Status: DC

## 2016-11-22 MED ORDER — AMLODIPINE BESYLATE 10 MG PO TABS: 10.0000 mg | ORAL_TABLET | Freq: Every day | ORAL | 6 refills | Status: DC

## 2016-11-22 MED ORDER — FLUTICASONE PROPIONATE 50 MCG/ACT NA SUSP: 2.0000 | Freq: Every day | NASAL | 6 refills | Status: DC

## 2016-11-22 MED FILL — AMLODIPINE BESYLATE 10 MG T: 10 | 30 days supply | Qty: 30 | Fill #4

## 2016-11-22 MED FILL — LISINOPRIL-HCTZ 20-25 MG TA: 20-25 | 30 days supply | Qty: 30 | Fill #0

## 2016-11-22 MED FILL — FLUTICASONE PROP 50 MCG SPR: 50 | 30 days supply | Qty: 16 | Fill #1

## 2016-11-22 MED FILL — TAMSULOSIN HCL 0.4 MG CAP: 0.4 | 30 days supply | Qty: 30 | Fill #0

## 2016-11-22 NOTE — Patient Instructions (Signed)

## 2016-11-22 NOTE — Progress Notes (Signed)
Needs all meds refilled

## 2016-11-23 LAB — CMP14+EGFR
A/G RATIO: 1.7 (ref 1.2–2.2)
ALK PHOS: 67 IU/L (ref 39–117)
ALT: 26 IU/L (ref 0–44)
AST: 25 IU/L (ref 0–40)
Albumin: 4.7 g/dL (ref 3.5–5.5)
BUN/Creatinine Ratio: 15 (ref 9–20)
BUN: 14 mg/dL (ref 6–24)
Bilirubin Total: 0.7 mg/dL (ref 0.0–1.2)
CALCIUM: 9.3 mg/dL (ref 8.7–10.2)
CO2: 24 mmol/L (ref 18–29)
CREATININE: 0.92 mg/dL (ref 0.76–1.27)
Chloride: 95 mmol/L — ABNORMAL LOW (ref 96–106)
GFR calc Af Amer: 106 mL/min/{1.73_m2} (ref >59)
GFR calc non Af Amer: 91 mL/min/{1.73_m2} (ref >59)
GLUCOSE: 89 mg/dL (ref 65–99)
Globulin, Total: 2.8 g/dL (ref 1.5–4.5)
Potassium: 3.8 mmol/L (ref 3.5–5.2)
SODIUM: 136 mmol/L (ref 134–144)
TOTAL PROTEIN: 7.5 g/dL (ref 6.0–8.5)

## 2016-11-23 LAB — HEPATITIS C ANTIBODY (REFLEX)

## 2016-11-23 LAB — LIPID PANEL
CHOL/HDL RATIO: 2.5 ratio (ref 0.0–5.0)
Cholesterol, Total: 194 mg/dL (ref 100–199)
HDL: 77 mg/dL (ref >39)
LDL Calculated: 107 mg/dL — ABNORMAL HIGH (ref 0–99)
TRIGLYCERIDES: 49 mg/dL (ref 0–149)
VLDL Cholesterol Cal: 10 mg/dL (ref 5–40)

## 2016-11-23 LAB — PSA, TOTAL AND FREE
PROSTATE SPECIFIC AG, SERUM: 2.7 ng/mL (ref 0.0–4.0)
PSA FREE: 0.16 ng/mL
PSA, Free Pct: 5.9 %

## 2016-11-23 LAB — HCV COMMENT:

## 2016-11-23 NOTE — Progress Notes (Signed)
Subjective:  Patient ID: Jeffery Graves, male    DOB: June 02, 1956  Age: 61 y.o. MRN: [id number]  CC: Hypertension   HPI Jeffery Graves is a 61 year old male with a history of Hypertension, history of Adenocarcinoma of the prostate (s/p treatment), Benign prostate hypertrophy, allergic rhinitis who comes into the clinic for a follow up visit.  He is requesting a PSA which was requested by [facility] against an upcoming appointment on next week. Denies urinary symptoms and moves his bowels without any problems.  States he has had a colonoscopy but then he is unsure of the procedure he had. He has no complaints today. Requests refills today.  Past Medical History:  Diagnosis Date  . BPH (benign prostatic hyperplasia)   . Hx of radiation therapy 09/06/10 - 10/29/10   prostate, seminal vesicles  . Hypertension   . Prostate cancer ([place]) 04/26/2010   gleason 7, vol 20.88 cc    History reviewed. No pertinent surgical history.  No Known Allergies   Outpatient Medications Prior to Visit  Medication Sig Dispense Refill  . Artificial Tear Ointment (LUBRICANT EYE) OINT Apply 1 application to eye at bedtime. 1cm ribbon in lower conjunctiva of each eye daily at bed time. 1 Tube 0  . ketotifen (ZADITOR) 0.025 % ophthalmic solution Place 1 drop into both eyes 2 (two) times daily. 5 mL 0  . amLODipine (NORVASC) 10 MG tablet Take 1 tablet (10 mg total) by mouth daily. 30 tablet 5  . fluticasone (FLONASE) 50 MCG/ACT nasal spray Place 2 sprays into both nostrils daily. 16 g 6  . lisinopril-hydrochlorothiazide (PRINZIDE,ZESTORETIC) 20-25 MG tablet Take 1 tablet by mouth daily. 30 tablet 5  . tamsulosin (FLOMAX) 0.4 MG CAPS capsule TAKE 1 CAPSULE BY MOUTH DAILY AFTER SUPPER. 30 capsule 0  . Polyethyl Glycol-Propyl Glycol 0.4-0.3 % SOLN Apply 2 drops to eye 4 (four) times daily as needed (en each eye). (Patient not taking: Reported on 05/13/2015) 1 Bottle 0   No facility-administered medications  prior to visit.     ROS Review of Systems  Constitutional: Negative for activity change and appetite change.  HENT: Negative for sinus pressure and sore throat.   Eyes: Negative for visual disturbance.  Respiratory: Negative for cough, chest tightness and shortness of breath.   Cardiovascular: Negative for chest pain and leg swelling.  Gastrointestinal: Negative for abdominal distention, abdominal pain, constipation and diarrhea.  Endocrine: Negative.   Genitourinary: Negative for dysuria.  Musculoskeletal: Negative for joint swelling and myalgias.  Skin: Negative for rash.  Allergic/Immunologic: Negative.   Neurological: Negative for weakness, light-headedness and numbness.  Psychiatric/Behavioral: Negative for dysphoric mood and suicidal ideas.    Objective:  BP (!) 143/74 (BP Location: Right Arm, Patient Position: Sitting, Cuff Size: Small)   Pulse 75   Temp 98.2 F (36.8 C) (Oral)   Ht _0  (1.676 m)   Wt 203 lb 12.8 oz (92.4 kg)   SpO2 98%   BMI 32.89 kg/m   BP/Weight 11/22/2016 03/28/2016 1/95/0932  Systolic BP 671 245 809  Diastolic BP 74 75 72  Wt. (Lbs) 203.8 194.2 190.2  BMI 32.89 31.34 30.71      Physical Exam  Constitutional: He is oriented to person, place, and time. He appears well-developed and well-nourished.  Cardiovascular: Normal rate, normal heart sounds and intact distal pulses.   No murmur heard. Pulmonary/Chest: Effort normal and breath sounds normal. He has no wheezes. He has no rales. He exhibits no tenderness.  Abdominal: Soft. Bowel sounds are normal. He exhibits no distension and no mass. There is no tenderness.  Musculoskeletal: Normal range of motion.  Neurological: He is alert and oriented to person, place, and time.  Skin: Skin is warm and dry.  Psychiatric: He has a normal mood and affect.     Assessment & Plan:   1. Benign prostatic hyperplasia without lower urinary tract symptoms Stable - tamsulosin (FLOMAX) 0.4 MG CAPS  capsule; TAKE 1 CAPSULE BY MOUTH DAILY AFTER SUPPER.  Dispense: 30 capsule; Refill: 6  2. Essential hypertension Controlled - lisinopril-hydrochlorothiazide (PRINZIDE,ZESTORETIC) 20-25 MG tablet; Take 1 tablet by mouth daily.  Dispense: 30 tablet; Refill: 6 - amLODipine (NORVASC) 10 MG tablet; Take 1 tablet (10 mg total) by mouth daily.  Dispense: 30 tablet; Refill: 6 - Lipid panel - CMP14+EGFR  3. ADENOCARCINOMA, PROSTATE, GLEASON GRADE 6 Keep appointment with [facility] - PSA, total and free  4. Screening for colon cancer - Ambulatory referral to Gastroenterology  5. Screening for viral disease - Hepatitis c antibody (reflex)   Meds ordered this encounter  Medications  . tamsulosin (FLOMAX) 0.4 MG CAPS capsule    Sig: TAKE 1 CAPSULE BY MOUTH DAILY AFTER SUPPER.    Dispense:  30 capsule    Refill:  6    Must have office visit for refills  . lisinopril-hydrochlorothiazide (PRINZIDE,ZESTORETIC) 20-25 MG tablet    Sig: Take 1 tablet by mouth daily.    Dispense:  30 tablet    Refill:  6  . fluticasone (FLONASE) 50 MCG/ACT nasal spray    Sig: Place 2 sprays into both nostrils daily.    Dispense:  16 g    Refill:  6  . amLODipine (NORVASC) 10 MG tablet    Sig: Take 1 tablet (10 mg total) by mouth daily.    Dispense:  30 tablet    Refill:  6    Follow-up: Return in about 6 months (around 05/24/2017) for follow up on Hypertension.   Arnoldo Morale MD

## 2016-11-30 ENCOUNTER — Telehealth: Payer: Self-pay

## 2016-11-30 NOTE — Telephone Encounter (Signed)
-----   Message from Arnoldo Morale, MD sent at 11/23/2016  1:46 PM EDT ----- Please inform the patient that labs are normal. Please fax lab results to [facility] Thank you.

## 2016-11-30 NOTE — Telephone Encounter (Signed)
Writer called patient regarding results.  Carilyn Goodpasture, RN faxed results over to [facility] on 11/24/16.  Patient states he saw Urology on 11/29/16.

## 2016-12-20 ENCOUNTER — Other Ambulatory Visit: Payer: Self-pay | Admitting: Family Medicine

## 2016-12-20 DIAGNOSIS — I10 Essential (primary) hypertension: Secondary | ICD-10-CM

## 2016-12-20 MED FILL — TAMSULOSIN HCL 0.4 MG CAP: 0.4 | 30 days supply | Qty: 30 | Fill #1

## 2016-12-20 MED FILL — LISINOPRIL-HCTZ 20-25 MG TA: 20-25 | 30 days supply | Qty: 30 | Fill #1

## 2016-12-20 MED FILL — ?AMLODIPINE BESYLATE 10 MG: 10 | 30 days supply | Qty: 30 | Fill #5

## 2016-12-23 ENCOUNTER — Encounter: Payer: Self-pay | Admitting: Family Medicine

## 2017-01-25 ENCOUNTER — Ambulatory Visit: Payer: Self-pay | Attending: Family Medicine

## 2017-01-25 ENCOUNTER — Other Ambulatory Visit: Payer: Self-pay | Admitting: Family Medicine

## 2017-01-25 DIAGNOSIS — I10 Essential (primary) hypertension: Secondary | ICD-10-CM

## 2017-01-25 MED FILL — ?TAMSULOSIN HCL 0.4 MG CAP: 0.4 | 30 days supply | Qty: 30 | Fill #2

## 2017-01-25 MED FILL — LISINOPRIL-HCTZ 20-25 MG TA: 20-25 | 30 days supply | Qty: 30 | Fill #2

## 2017-01-30 MED FILL — ?AMLODIPINE BESYLATE 10 MG: 10 | 30 days supply | Qty: 30 | Fill #0

## 2017-02-07 ENCOUNTER — Encounter: Payer: Self-pay | Admitting: Physician Assistant

## 2017-02-07 ENCOUNTER — Ambulatory Visit: Payer: Self-pay | Attending: Family Medicine | Admitting: Physician Assistant

## 2017-02-07 VITALS — BP 163/77 | HR 63 | Temp 98.5°F | Resp 16 | Wt 203.2 lb

## 2017-02-07 DIAGNOSIS — Z8546 Personal history of malignant neoplasm of prostate: Secondary | ICD-10-CM | POA: Insufficient documentation

## 2017-02-07 DIAGNOSIS — Z79899 Other long term (current) drug therapy: Secondary | ICD-10-CM | POA: Insufficient documentation

## 2017-02-07 DIAGNOSIS — R05 Cough: Secondary | ICD-10-CM | POA: Insufficient documentation

## 2017-02-07 DIAGNOSIS — R059 Cough, unspecified: Secondary | ICD-10-CM

## 2017-02-07 DIAGNOSIS — I10 Essential (primary) hypertension: Secondary | ICD-10-CM | POA: Insufficient documentation

## 2017-02-07 DIAGNOSIS — J309 Allergic rhinitis, unspecified: Secondary | ICD-10-CM | POA: Insufficient documentation

## 2017-02-07 DIAGNOSIS — N400 Enlarged prostate without lower urinary tract symptoms: Secondary | ICD-10-CM | POA: Insufficient documentation

## 2017-02-07 MED ORDER — BENZONATATE 100 MG PO CAPS: 100.0000 mg | ORAL_CAPSULE | Freq: Two times a day (BID) | ORAL | 0 refills | Status: DC | PRN

## 2017-02-07 MED ORDER — HYDROCODONE-HOMATROPINE 5-1.5 MG/5ML PO SYRP: 5.0000 mL | ORAL_SOLUTION | Freq: Four times a day (QID) | ORAL | 0 refills | Status: DC | PRN

## 2017-02-07 NOTE — Progress Notes (Signed)
Chief Complaint: cough  Subjective: This is a 61 year old male that is followed here for hypertension, history of adenocarcinoma of the prostate, benign prostate hypertrophy and allergic rhinitis. He is presenting with a cough. He states his been intermittent for one month. No phlegm production. Usually at night when it happens. No shortness of breath associated. No swelling in his lower extremities. No fevers. No chills. No palpitations. No chest pain. He states he is not allergic to anything and nothing is different in his home. He's been on an ACE inhibitor but this is been for years now this should not be contributing to the cough. He really hasn't done much over-the-counter to improve his symptoms.  ROS:  GEN: denies fever or chills, denies change in weight Skin: denies lesions or rashes HEENT: denies headache, earache, epistaxis, sore throat, or neck pain LUNGS: denies SHOB, dyspnea, PND, orthopnea +cough CV: denies CP or palpitations EXT: denies muscle spasms or swelling; no pain in lower ext, no weakness   Objective:  Vitals:   02/07/17 1105  BP: (!) 163/77  Pulse: 63  Resp: 16  Temp: 98.5 F (36.9 C)  TempSrc: Oral  SpO2: 98%  Weight: 203 lb 3.2 oz (92.2 kg)    Physical Exam:  General: in no acute distress. HEENT: no pallor, no icterus, moist oral mucosa, no JVD, no lymphadenopathy; bilateral cerumen Heart: Normal  s1 &s2  Regular rate and rhythm, without murmurs, rubs, gallops. Lungs: Clear to auscultation bilaterally! Extremities: No clubbing cyanosis or edema with positive pedal pulses.   Medications: Prior to Admission medications   Medication Sig Start Date End Date Taking? Authorizing Provider  amLODipine (NORVASC) 10 MG tablet Take 1 tablet (10 mg total) by mouth daily. 11/22/16  Yes Arnoldo Morale, MD  fluticasone (FLONASE) 50 MCG/ACT nasal spray Place 2 sprays into both nostrils daily. 11/22/16  Yes Arnoldo Morale, MD  lisinopril-hydrochlorothiazide  (PRINZIDE,ZESTORETIC) 20-25 MG tablet Take 1 tablet by mouth daily. 11/22/16  Yes Arnoldo Morale, MD  tamsulosin (FLOMAX) 0.4 MG CAPS capsule TAKE 1 CAPSULE BY MOUTH DAILY AFTER SUPPER. 11/22/16  Yes Arnoldo Morale, MD  Artificial Tear Ointment (LUBRICANT EYE) OINT Apply 1 application to eye at bedtime. 1cm ribbon in lower conjunctiva of each eye daily at bed time. Patient not taking: Reported on 02/07/2017 11/01/12   Moreno-Coll, Adlih, MD  benzonatate (TESSALON) 100 MG capsule Take 1 capsule (100 mg total) by mouth 2 (two) times daily as needed for cough. 02/07/17   Brayton Caves, PA-C  HYDROcodone-homatropine (HYCODAN) 5-1.5 MG/5ML syrup Take 5 mLs by mouth every 6 (six) hours as needed for cough. 02/07/17   Brayton Caves, PA-C  ketotifen (ZADITOR) 0.025 % ophthalmic solution Place 1 drop into both eyes 2 (two) times daily. Patient not taking: Reported on 02/07/2017 11/01/12   Moreno-Coll, Adlih, MD  Polyethyl Glycol-Propyl Glycol 0.4-0.3 % SOLN Apply 2 drops to eye 4 (four) times daily as needed (en each eye). Patient not taking: Reported on 05/13/2015 11/01/12   Moreno-Coll, Leonette Monarch, MD    Assessment: Cough  Plan: Chest xray Hycodan syrup Tessalon perles  Follow up:2 weeks  The patient was given clear instructions to go to ER or return to medical center if symptoms don't improve, worsen or new problems develop. The patient verbalized understanding. The patient was told to call to get lab results if they haven't heard anything in the next week.   This note has been created with Surveyor, quantity. Any transcriptional errors  are unintentional.   Zettie Pho, PA-C 02/07/2017, 11:16 AM

## 2017-02-07 NOTE — Progress Notes (Signed)
Pt states his cough gets worse at ngiht

## 2017-02-13 ENCOUNTER — Encounter (HOSPITAL_COMMUNITY): Payer: Self-pay | Admitting: Emergency Medicine

## 2017-02-13 ENCOUNTER — Ambulatory Visit (HOSPITAL_COMMUNITY)
Admission: EM | Admit: 2017-02-13 | Discharge: 2017-02-13 | Disposition: A | Discharge status: Home / Self Care | Payer: Self-pay | Attending: Family Medicine | Admitting: Family Medicine

## 2017-02-13 DIAGNOSIS — J301 Allergic rhinitis due to pollen: Secondary | ICD-10-CM

## 2017-02-13 DIAGNOSIS — R059 Cough, unspecified: Secondary | ICD-10-CM

## 2017-02-13 DIAGNOSIS — R0982 Postnasal drip: Secondary | ICD-10-CM

## 2017-02-13 DIAGNOSIS — R05 Cough: Secondary | ICD-10-CM

## 2017-02-13 NOTE — ED Provider Notes (Signed)
CSN: [id number]     Arrival date & time 02/13/17  1010 History   First MD Initiated Contact with Patient 02/13/17 1141     Chief Complaint  Patient presents with  . Cough   (Consider location/radiation/quality/duration/timing/severity/associated sxs/prior Treatment) 61 year old male presents to the urgent care with a cough for 2 months. He states that on the 10th of this month he was seen by his PCP, had a limited examination and was told that medicine including cough medicine would be called into the pharmacy for him. He never picked these up and neither did he give a reason. He continues to cough and while in the urgent care he is constantly clearing his throat and having some coughing as well. He is uncertain as to what that medication was but states he doubts he will pick it up.      Past Medical History:  Diagnosis Date  . BPH (benign prostatic hyperplasia)   . Hx of radiation therapy 09/06/10 - 10/29/10   prostate, seminal vesicles  . Hypertension   . Prostate cancer ([place]) 04/26/2010   gleason 7, vol 20.88 cc   History reviewed. No pertinent surgical history. Family History  Problem Relation Age of Onset  . Stroke Mother    Social History  Substance Use Topics  . Smoking status: Former Research scientist (life sciences)  . Smokeless tobacco: Never Used  . 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 Negative for diaphoresis, fatigue and fever.  HENT: Positive for postnasal drip. Negative for ear pain, facial swelling, rhinorrhea, sore throat and trouble swallowing.   Eyes: Negative for pain, discharge and redness.  Respiratory: Positive for cough. Negative for chest tightness and shortness of breath.   Cardiovascular: Negative.   Gastrointestinal: Negative.   Musculoskeletal: Negative.  Negative for neck pain and neck stiffness.  Neurological: Negative.   All other systems reviewed and are  negative.   Allergies  Patient has no known allergies.  Home Medications   Prior to Admission medications   Medication Sig Start Date End Date Taking? Authorizing Provider  amLODipine (NORVASC) 10 MG tablet Take 1 tablet (10 mg total) by mouth daily. 11/22/16  Yes Arnoldo Morale, MD  fluticasone (FLONASE) 50 MCG/ACT nasal spray Place 2 sprays into both nostrils daily. 11/22/16  Yes Arnoldo Morale, MD  lisinopril-hydrochlorothiazide (PRINZIDE,ZESTORETIC) 20-25 MG tablet Take 1 tablet by mouth daily. 11/22/16  Yes Arnoldo Morale, MD  tamsulosin (FLOMAX) 0.4 MG CAPS capsule TAKE 1 CAPSULE BY MOUTH DAILY AFTER SUPPER. 11/22/16  Yes Arnoldo Morale, MD   Meds Ordered and Administered this Visit  Medications - No data to display  BP (!) 153/94 (BP Location: Right Arm)   Pulse 80   Temp 98.4 F (36.9 C) (Oral)   Resp 18   SpO2 98%  No data found.   Physical Exam  Constitutional: He is oriented to person, place, and time. He appears well-developed and well-nourished. No distress.  HENT:  Bilateral TMs are normal. Oropharynx with much cobblestoning and clear PND. No exudates. Airway widely patent.  Eyes: EOM are normal.  Neck: Normal range of motion. Neck supple.  Cardiovascular: Normal rate, regular rhythm, normal heart sounds and intact distal pulses.   Pulmonary/Chest: Effort normal and breath sounds normal. No respiratory distress. He has no wheezes. He has no rales.  Lymphadenopathy:    He has no cervical adenopathy.  Neurological: He is alert  and oriented to person, place, and time.  Skin: Skin is warm and dry.  Psychiatric: He has a normal mood and affect.  Nursing note and vitals reviewed.   Urgent Care Course     Procedures (including critical care time)  Labs Review Labs Reviewed - No data to display  Imaging Review No results found.   Visual Acuity Review  Right Eye Distance:   Left Eye Distance:   Bilateral Distance:    Right Eye Near:   Left Eye Near:     Bilateral Near:         MDM   1. Cough   2. PND (post-nasal drip)   3. Seasonal allergic rhinitis due to pollen    The most likely reason for your persistent cough is the drainage that you have been having down the back of your throat from your sinuses. This gets worse at night and she described because the fluid gets stuck in the back of your throat. To minimize or reduce the drainage in the back of your throat he may take either Zyrtec or Allegra. For stronger medicine that may be needed at nighttime take Chlor-Trimeton 2 or 4 mg every 4 hours for drainage. This may cause some drowsiness. Be sure to drink plenty of fluids especially before going to bed and after waking up. If you are not getting better follow-up with your primary care provider or may return.    Janne Napoleon, NP 02/13/17 1203

## 2017-02-13 NOTE — Discharge Instructions (Signed)
The most likely reason for your persistent cough is the drainage that you have been having down the back of your throat from your sinuses. This gets worse at night and she described because the fluid gets stuck in the back of your throat. To minimize or reduce the drainage in the back of your throat he may take either Zyrtec or Allegra. For stronger medicine that may be needed at nighttime take Chlor-Trimeton 2 or 4 mg every 4 hours for drainage. This may cause some drowsiness. Be sure to drink plenty of fluids especially before going to bed and after waking up. If you are not getting better follow-up with your primary care provider or may return.

## 2017-02-13 NOTE — ED Triage Notes (Signed)
The patient presented to the [facility] with a complaint of a cough x 2 months.

## 2017-03-06 MED FILL — AMLODIPINE BESYLATE 10 MG T: 10 | 30 days supply | Qty: 30 | Fill #1

## 2017-03-06 MED FILL — LISINOPRIL-HCTZ 20-25 MG TA: 20-25 | 30 days supply | Qty: 30 | Fill #3

## 2017-03-06 MED FILL — TAMSULOSIN HCL 0.4 MG CAP: 0.4 | 30 days supply | Qty: 30 | Fill #3

## 2017-03-13 ENCOUNTER — Ambulatory Visit: Payer: Self-pay | Attending: Family Medicine

## 2017-03-13 ENCOUNTER — Other Ambulatory Visit: Payer: Self-pay | Admitting: Family Medicine

## 2017-03-14 LAB — PSA, TOTAL AND FREE
PSA FREE PCT: 5.3 %
PSA FREE: 0.16 ng/mL
Prostate Specific Ag, Serum: 3 ng/mL (ref 0.0–4.0)

## 2017-03-15 ENCOUNTER — Telehealth: Payer: Self-pay

## 2017-03-15 ENCOUNTER — Telehealth: Payer: Self-pay | Admitting: Family Medicine

## 2017-03-15 NOTE — Telephone Encounter (Signed)
Pt was called and a VM was left informing pt to return phone call for lab results. If pt returns phone call please inform pt of normal PSA results.

## 2017-03-15 NOTE — Telephone Encounter (Signed)
Pt called to try to speak with the nurse, he want to know what it mean his lab result is NORMAL.... He want more explanation, please follow up

## 2017-03-15 NOTE — Telephone Encounter (Signed)
Pt was called and a VM was

## 2017-03-15 NOTE — Telephone Encounter (Signed)
Pt was called and explained lab results.

## 2017-04-06 MED FILL — AMLODIPINE BESYLATE 10 MG T: 10 | 30 days supply | Qty: 30 | Fill #2

## 2017-04-06 MED FILL — LISINOPRIL-HCTZ 20-25 MG TA: 20-25 | 30 days supply | Qty: 30 | Fill #4

## 2017-04-06 MED FILL — ?TAMSULOSIN HCL 0.4 MG CAP: 0.4 | 30 days supply | Qty: 30 | Fill #4

## 2017-05-15 MED FILL — LISINOPRIL-HCTZ 20-25 MG TA: 20-25 | 30 days supply | Qty: 30 | Fill #5

## 2017-05-15 MED FILL — FLUTICASONE PROP 50 MCG SPR: 50 | 20 days supply | Qty: 16 | Fill #0

## 2017-05-15 MED FILL — TAMSULOSIN HCL 0.4 MG CAP: 0.4 | 30 days supply | Qty: 30 | Fill #5

## 2017-05-15 MED FILL — AMLODIPINE BESYLATE 10 MG T: 10 | 30 days supply | Qty: 30 | Fill #3

## 2017-06-12 MED FILL — AMLODIPINE BESYLATE 10 MG T: 10 | 30 days supply | Qty: 30 | Fill #4

## 2017-06-12 MED FILL — LISINOPRIL-HCTZ 20-25 MG TA: 20-25 | 30 days supply | Qty: 30 | Fill #6

## 2017-06-12 MED FILL — TAMSULOSIN HCL 0.4 MG CAP: 0.4 | 30 days supply | Qty: 30 | Fill #6

## 2017-06-14 ENCOUNTER — Other Ambulatory Visit: Payer: Self-pay

## 2017-06-14 DIAGNOSIS — C61 Malignant neoplasm of prostate: Secondary | ICD-10-CM

## 2017-06-19 ENCOUNTER — Ambulatory Visit: Payer: Self-pay | Attending: Family Medicine

## 2017-06-19 DIAGNOSIS — C61 Malignant neoplasm of prostate: Secondary | ICD-10-CM | POA: Insufficient documentation

## 2017-06-19 NOTE — Progress Notes (Signed)
Patient here for lab visit only. PSA lab work needs to be sent to [facility] [phone].

## 2017-06-20 LAB — PSA: PROSTATE SPECIFIC AG, SERUM: 3.3 ng/mL (ref 0.0–4.0)

## 2017-07-26 ENCOUNTER — Telehealth: Payer: Self-pay | Admitting: Family Medicine

## 2017-07-26 ENCOUNTER — Other Ambulatory Visit: Payer: Self-pay | Admitting: Family Medicine

## 2017-07-26 DIAGNOSIS — N400 Enlarged prostate without lower urinary tract symptoms: Secondary | ICD-10-CM

## 2017-07-26 DIAGNOSIS — I10 Essential (primary) hypertension: Secondary | ICD-10-CM

## 2017-07-26 MED FILL — AMLODIPINE BESYLATE 10 MG T: 10 | 30 days supply | Qty: 30 | Fill #5

## 2017-07-26 MED FILL — ?TAMSULOSIN HCL 0.4 MG CAP: 0.4 | 30 days supply | Qty: 30 | Fill #0

## 2017-07-26 MED FILL — LISINOPRIL-HCTZ 20-25 MG TA: 20-25 | 30 days supply | Qty: 30 | Fill #0

## 2017-08-07 ENCOUNTER — Ambulatory Visit: Payer: Self-pay | Attending: Family Medicine

## 2017-08-15 ENCOUNTER — Encounter: Payer: Self-pay | Admitting: Family Medicine

## 2017-08-15 ENCOUNTER — Ambulatory Visit: Payer: Self-pay | Attending: Family Medicine | Admitting: Family Medicine

## 2017-08-15 VITALS — BP 151/82 | HR 79 | Temp 98.2°F | Ht 66.0 in | Wt 211.4 lb

## 2017-08-15 DIAGNOSIS — I10 Essential (primary) hypertension: Secondary | ICD-10-CM | POA: Insufficient documentation

## 2017-08-15 DIAGNOSIS — C61 Malignant neoplasm of prostate: Secondary | ICD-10-CM

## 2017-08-15 DIAGNOSIS — Z8546 Personal history of malignant neoplasm of prostate: Secondary | ICD-10-CM | POA: Insufficient documentation

## 2017-08-15 DIAGNOSIS — N400 Enlarged prostate without lower urinary tract symptoms: Secondary | ICD-10-CM | POA: Insufficient documentation

## 2017-08-15 DIAGNOSIS — Z79899 Other long term (current) drug therapy: Secondary | ICD-10-CM | POA: Insufficient documentation

## 2017-08-15 DIAGNOSIS — Z23 Encounter for immunization: Secondary | ICD-10-CM | POA: Insufficient documentation

## 2017-08-15 DIAGNOSIS — Z923 Personal history of irradiation: Secondary | ICD-10-CM | POA: Insufficient documentation

## 2017-08-15 MED ORDER — AMLODIPINE BESYLATE 10 MG PO TABS: 10.0000 mg | ORAL_TABLET | Freq: Every day | ORAL | 6 refills | Status: DC

## 2017-08-15 MED ORDER — FLUTICASONE PROPIONATE 50 MCG/ACT NA SUSP: 2.0000 | Freq: Every day | NASAL | 6 refills | Status: DC

## 2017-08-15 MED ORDER — TAMSULOSIN HCL 0.4 MG PO CAPS: 0.4000 mg | ORAL_CAPSULE | Freq: Every day | ORAL | 6 refills | Status: DC

## 2017-08-15 MED ORDER — LISINOPRIL-HYDROCHLOROTHIAZIDE 20-25 MG PO TABS: 1.0000 | ORAL_TABLET | Freq: Every day | ORAL | 0 refills | Status: DC

## 2017-08-16 NOTE — Progress Notes (Signed)
Subjective:  Patient ID: Jeffery Graves, male    DOB: May 24, 1956  Age: 62 y.o. MRN: [id number]  CC: Annual Exam   HPI Jeffery Graves is a 62 year old male with a history of Hypertension, history of Adenocarcinoma of the prostate (s/p treatment), Benign prostatic hypertrophy, allergic rhinitis who comes into the clinic for a follow up visit.  He tolerates his antihypertensives with no complains of adverse effects. BPH symptoms are controlled and he is doing well on Flomax. He has regular 3 monthly follow up with [facility] due to the history of adenoma of the prostate.  Denies acute concerns today.  Past Medical History:  Diagnosis Date  . BPH (benign prostatic hyperplasia)   . Hx of radiation therapy 09/06/10 - 10/29/10   prostate, seminal vesicles  . Hypertension   . Prostate cancer ([place]) 04/26/2010   gleason 7, vol 20.88 cc    History reviewed. No pertinent surgical history.  No Known Allergies   Outpatient Medications Prior to Visit  Medication Sig Dispense Refill  . amLODipine (NORVASC) 10 MG tablet Take 1 tablet (10 mg total) by mouth daily. 30 tablet 6  . fluticasone (FLONASE) 50 MCG/ACT nasal spray Place 2 sprays into both nostrils daily. 16 g 6  . lisinopril-hydrochlorothiazide (PRINZIDE,ZESTORETIC) 20-25 MG tablet TAKE 1 TABLET BY MOUTH DAILY. 30 tablet 0  . tamsulosin (FLOMAX) 0.4 MG CAPS capsule TAKE 1 CAPSULE BY MOUTH DAILY AFTER SUPPER. 30 capsule 0   No facility-administered medications prior to visit.     ROS Review of Systems  Constitutional: Negative for activity change and appetite change.  HENT: Negative for sinus pressure and sore throat.   Eyes: Negative for visual disturbance.  Respiratory: Negative for cough, chest tightness and shortness of breath.   Cardiovascular: Negative for chest pain and leg swelling.  Gastrointestinal: Negative for abdominal distention, abdominal pain, constipation and diarrhea.  Endocrine: Negative.   Genitourinary:  Negative for dysuria.  Musculoskeletal: Negative for joint swelling and myalgias.  Skin: Negative for rash.  Allergic/Immunologic: Negative.   Neurological: Negative for weakness, light-headedness and numbness.  Psychiatric/Behavioral: Negative for dysphoric mood and suicidal ideas.    Objective:  BP (!) 151/82   Pulse 79   Temp 98.2 F (36.8 C) (Oral)   Ht '5\' 6"'  (1.676 m)   Wt 211 lb 6.4 oz (95.9 kg)   SpO2 98%   BMI 34.12 kg/m   BP/Weight 08/15/2017 02/13/2017 4/49/2010  Systolic BP 071 219 758  Diastolic BP 82 94 77  Wt. (Lbs) 211.4 - 203.2  BMI 34.12 - 32.8      Physical Exam  Constitutional: He is oriented to person, place, and time. He appears well-developed and well-nourished.  Cardiovascular: Normal rate, normal heart sounds and intact distal pulses.  No murmur heard. Pulmonary/Chest: Effort normal and breath sounds normal. He has no wheezes. He has no rales. He exhibits no tenderness.  Abdominal: Soft. Bowel sounds are normal. He exhibits no distension and no mass. There is no tenderness.  Musculoskeletal: Normal range of motion.  Neurological: He is alert and oriented to person, place, and time.  Skin: Skin is warm and dry.  Psychiatric: He has a normal mood and affect.     Assessment & Plan:   1. Essential hypertension Slightly elevated No regimen change today Counseled on blood pressure goal of less than 130/80, low-sodium, DASH diet, medication compliance, 150 minutes of moderate intensity exercise per week. Discussed medication compliance, adverse effects. - lisinopril-hydrochlorothiazide (PRINZIDE,ZESTORETIC) 20-25  MG tablet; Take 1 tablet by mouth daily.  Dispense: 30 tablet; Refill: 0 - amLODipine (NORVASC) 10 MG tablet; Take 1 tablet (10 mg total) by mouth daily.  Dispense: 30 tablet; Refill: 6 - CMP14+EGFR; Future  2. Benign prostatic hyperplasia without lower urinary tract symptoms Stable - PSA, total and free; Future - tamsulosin (FLOMAX) 0.4  MG CAPS capsule; Take 1 capsule (0.4 mg total) by mouth daily after breakfast.  Dispense: 30 capsule; Refill: 6  3. ADENOCARCINOMA, PROSTATE, GLEASON GRADE 6 S/p treatment Followed by [facility]  4. Need for influenza vaccination - Flu Vaccine QUAD 36+ mos IM  HCM - up to date on colonoscopy  Meds ordered this encounter  Medications  . lisinopril-hydrochlorothiazide (PRINZIDE,ZESTORETIC) 20-25 MG tablet    Sig: Take 1 tablet by mouth daily.    Dispense:  30 tablet    Refill:  0  . tamsulosin (FLOMAX) 0.4 MG CAPS capsule    Sig: Take 1 capsule (0.4 mg total) by mouth daily after breakfast.    Dispense:  30 capsule    Refill:  6  . fluticasone (FLONASE) 50 MCG/ACT nasal spray    Sig: Place 2 sprays into both nostrils daily.    Dispense:  16 g    Refill:  6  . amLODipine (NORVASC) 10 MG tablet    Sig: Take 1 tablet (10 mg total) by mouth daily.    Dispense:  30 tablet    Refill:  6    Follow-up: Return in about 6 months (around 02/12/2018) for follow up of chronic medical conditions.   Arnoldo Morale MD

## 2017-08-28 MED FILL — AMLODIPINE BESYLATE 10 MG T: 10 | 30 days supply | Qty: 30 | Fill #0

## 2017-08-28 MED FILL — LISINOPRIL-HCTZ 20-25 MG TA: 20-25 | 30 days supply | Qty: 30 | Fill #0

## 2017-08-28 MED FILL — FLUTICASONE PROP 50 MCG SPR: 50 | 30 days supply | Qty: 16 | Fill #0

## 2017-08-28 MED FILL — ?TAMSULOSIN HCL 0.4 MG CAP: 0.4 | 30 days supply | Qty: 30 | Fill #0

## 2017-09-11 ENCOUNTER — Ambulatory Visit: Payer: Self-pay | Attending: Family Medicine

## 2017-09-11 DIAGNOSIS — N400 Enlarged prostate without lower urinary tract symptoms: Secondary | ICD-10-CM | POA: Insufficient documentation

## 2017-09-11 DIAGNOSIS — I10 Essential (primary) hypertension: Secondary | ICD-10-CM

## 2017-09-11 NOTE — Progress Notes (Signed)
Patient here for lab visit  

## 2017-09-12 LAB — CMP14+EGFR
A/G RATIO: 1.3 (ref 1.2–2.2)
ALBUMIN: 4.5 g/dL (ref 3.5–5.5)
ALT: 43 IU/L (ref 0–44)
AST: 48 IU/L — ABNORMAL HIGH (ref 0–40)
Alkaline Phosphatase: 74 IU/L (ref 39–117)
BUN / CREAT RATIO: 11 (ref 9–20)
BUN: 11 mg/dL (ref 6–24)
Bilirubin Total: 0.5 mg/dL (ref 0.0–1.2)
CALCIUM: 9.4 mg/dL (ref 8.7–10.2)
CO2: 23 mmol/L (ref 20–29)
Chloride: 100 mmol/L (ref 96–106)
Creatinine, Ser: 0.97 mg/dL (ref 0.76–1.27)
GFR, EST AFRICAN AMERICAN: 98 mL/min/{1.73_m2} (ref >59)
GFR, EST NON AFRICAN AMERICAN: 85 mL/min/{1.73_m2} (ref >59)
Globulin, Total: 3.5 g/dL (ref 1.5–4.5)
Glucose: 74 mg/dL (ref 65–99)
POTASSIUM: 3.9 mmol/L (ref 3.5–5.2)
Sodium: 140 mmol/L (ref 134–144)
TOTAL PROTEIN: 8 g/dL (ref 6.0–8.5)

## 2017-09-12 LAB — PSA, TOTAL AND FREE
PSA FREE PCT: 6.1 %
PSA, Free: 0.23 ng/mL
Prostate Specific Ag, Serum: 3.8 ng/mL (ref 0.0–4.0)

## 2017-09-14 ENCOUNTER — Telehealth: Payer: Self-pay

## 2017-09-14 NOTE — Telephone Encounter (Signed)
Patient was called and informed of lab results. Patient results will be sent over to his urologist.

## 2017-10-02 ENCOUNTER — Other Ambulatory Visit: Payer: Self-pay | Admitting: Family Medicine

## 2017-10-02 DIAGNOSIS — I10 Essential (primary) hypertension: Secondary | ICD-10-CM

## 2017-10-02 MED FILL — AMLODIPINE BESYLATE 10 MG T: 10 | 30 days supply | Qty: 30 | Fill #1

## 2017-10-02 MED FILL — LISINOPRIL-HCTZ 20-25 MG TA: 20-25 | 30 days supply | Qty: 30 | Fill #0

## 2017-10-02 MED FILL — ?TAMSULOSIN HCL 0.4 MG CAP: 0.4 | 30 days supply | Qty: 30 | Fill #1

## 2017-11-02 MED FILL — ?TAMSULOSIN HCL 0.4 MG CAP: 0.4 | 30 days supply | Qty: 30 | Fill #2

## 2017-11-02 MED FILL — LISINOPRIL-HCTZ 20-25 MG TA: 20-25 | 30 days supply | Qty: 30 | Fill #1

## 2017-11-02 MED FILL — AMLODIPINE BESYLATE 10 MG T: 10 | 30 days supply | Qty: 30 | Fill #2

## 2017-11-06 ENCOUNTER — Ambulatory Visit: Payer: Self-pay | Attending: Family Medicine

## 2017-11-06 DIAGNOSIS — N400 Enlarged prostate without lower urinary tract symptoms: Secondary | ICD-10-CM

## 2017-11-07 ENCOUNTER — Telehealth: Payer: Self-pay

## 2017-11-07 LAB — PSA, TOTAL AND FREE
PROSTATE SPECIFIC AG, SERUM: 4 ng/mL (ref 0.0–4.0)
PSA FREE PCT: 8 %
PSA FREE: 0.32 ng/mL

## 2017-11-07 NOTE — Telephone Encounter (Signed)
Patient was called and informed of lab results. Results will be faxed over to [facility].

## 2017-11-23 MED FILL — FLUTICASONE PROP 50 MCG SPR: 50 | 30 days supply | Qty: 16 | Fill #1

## 2017-11-30 ENCOUNTER — Telehealth: Payer: Self-pay | Admitting: Family Medicine

## 2017-11-30 NOTE — Telephone Encounter (Signed)
Patient came in the office and requested for a dental referral. Please fu at your earliest convenience.

## 2017-12-01 NOTE — Telephone Encounter (Signed)
Will route to PCP 

## 2017-12-04 NOTE — Telephone Encounter (Signed)
Could you please inform him of the setback with [facility] and provide him with options for the free clinics?  Thank you

## 2017-12-05 MED FILL — LISINOPRIL-HCTZ 20-25 MG TA: 20-25 | 30 days supply | Qty: 30 | Fill #2

## 2017-12-05 MED FILL — AMLODIPINE BESYLATE 10 MG T: 10 | 30 days supply | Qty: 30 | Fill #3

## 2017-12-05 MED FILL — ?TAMSULOSIN HCL 0.4 MG CAP: 0.4 | 30 days supply | Qty: 30 | Fill #3

## 2017-12-23 ENCOUNTER — Emergency Department (HOSPITAL_COMMUNITY)
Admission: EM | Admit: 2017-12-23 | Discharge: 2017-12-23 | Disposition: A | Discharge status: Home / Self Care | Payer: Self-pay | Attending: Emergency Medicine | Admitting: Emergency Medicine

## 2017-12-23 ENCOUNTER — Encounter (HOSPITAL_COMMUNITY): Payer: Self-pay | Admitting: Emergency Medicine

## 2017-12-23 DIAGNOSIS — Z8546 Personal history of malignant neoplasm of prostate: Secondary | ICD-10-CM | POA: Insufficient documentation

## 2017-12-23 DIAGNOSIS — K0889 Other specified disorders of teeth and supporting structures: Secondary | ICD-10-CM | POA: Insufficient documentation

## 2017-12-23 DIAGNOSIS — Z923 Personal history of irradiation: Secondary | ICD-10-CM | POA: Insufficient documentation

## 2017-12-23 DIAGNOSIS — Z87891 Personal history of nicotine dependence: Secondary | ICD-10-CM | POA: Insufficient documentation

## 2017-12-23 DIAGNOSIS — Z79899 Other long term (current) drug therapy: Secondary | ICD-10-CM | POA: Insufficient documentation

## 2017-12-23 DIAGNOSIS — I10 Essential (primary) hypertension: Secondary | ICD-10-CM | POA: Insufficient documentation

## 2017-12-23 MED ORDER — PENICILLIN V POTASSIUM 500 MG PO TABS: 500.0000 mg | ORAL_TABLET | Freq: Three times a day (TID) | ORAL | 0 refills | Status: AC

## 2017-12-23 NOTE — Discharge Instructions (Signed)
It is very important that you get evaluated by a dentist as soon as possible. Call tomorrow to schedule an appointment. Take your full course of antibiotics. Read the instructions below.  Eat a soft or liquid diet and rinse your mouth out after meals with warm water. You should see a dentist or return here at once if you have increased swelling, increased pain or uncontrolled bleeding from the site of your injury.  SEEK MEDICAL CARE IF:  You have a fever >101 If you are unable to open your mouth New or worsening symptoms develop, any additional concerns.

## 2017-12-23 NOTE — ED Triage Notes (Signed)
Patient complains of right lower dental pain x3 weeks, states this morning his lower lip was swollen too so he decided to get evaluated.

## 2017-12-23 NOTE — ED Provider Notes (Signed)
[name] EMERGENCY DEPARTMENT Provider Note   CSN: [id number] Arrival date & time: 12/23/17  0818     History   Chief Complaint Chief Complaint  Patient presents with  . Dental Pain    HPI Jeffery Graves is a 62 y.o. male.  The history is provided by the patient and medical records. No language interpreter was used.  Dental Pain      Jeffery Graves is a 62 y.o. male who presents to the Emergency Department complaining of intermittent right-sided lower dental pain beginning about three weeks ago.  Pain is exacerbated when something warm touches the tooth.  I will intermittently ache as well without any warmth to the area.  He has history of similar to previous teeth which have needed to be extracted.  This morning, he woke up with the area around this tooth swollen, therefore came to emergency department to seek care. Pt denies fever, chills, difficulty breathing, difficulty swallowing.    Past Medical History:  Diagnosis Date  . BPH (benign prostatic hyperplasia)   . Hx of radiation therapy 09/06/10 - 10/29/10   prostate, seminal vesicles  . Hypertension   . Prostate cancer ([place]) 04/26/2010   gleason 7, vol 20.88 cc    Patient Active Problem List   Diagnosis Date Noted  . Unspecified vitamin D deficiency 03/13/2014  . BPH (benign prostatic hyperplasia) 10/04/2013  . DENTAL CARIES 02/10/2010  . ADENOCARCINOMA, PROSTATE, GLEASON GRADE 6 01/28/2010  . ERECTILE DYSFUNCTION, SECONDARY TO MEDICATION 02/26/2009  . HYPERTENSION, BENIGN ESSENTIAL 08/02/2007    History reviewed. No pertinent surgical history.      Home Medications    Prior to Admission medications   Medication Sig Start Date End Date Taking? Authorizing Provider  amLODipine (NORVASC) 10 MG tablet Take 1 tablet (10 mg total) by mouth daily. 08/15/17   Charlott Rakes, MD  fluticasone (FLONASE) 50 MCG/ACT nasal spray Place 2 sprays into both nostrils daily. 08/15/17   Charlott Rakes, MD    lisinopril-hydrochlorothiazide (PRINZIDE,ZESTORETIC) 20-25 MG tablet TAKE 1 TABLET BY MOUTH DAILY. 10/02/17   Charlott Rakes, MD  penicillin v potassium (VEETID) 500 MG tablet Take 1 tablet (500 mg total) by mouth 3 (three) times daily for 7 days. 12/23/17 12/30/17  Amillion Scobee, Ozella Almond, PA-C  tamsulosin (FLOMAX) 0.4 MG CAPS capsule Take 1 capsule (0.4 mg total) by mouth daily after breakfast. 08/15/17   Charlott Rakes, MD    Family History Family History  Problem Relation Age of Onset  . Stroke Mother     Social History Social History   Tobacco Use  . Smoking status: Former Research scientist (life sciences)  . Smokeless tobacco: Never Used  Substance Use Topics  . Alcohol use: No    Comment: hx heavy use per H&P 05/12/2011, none since 2003  . Drug use: No     Allergies   Patient has no known allergies.   Review of Systems Review of Systems  Constitutional: Negative for chills and fever.  HENT: Positive for dental problem. Negative for sore throat and trouble swallowing.   Respiratory: Negative for cough and shortness of breath.      Physical Exam Updated Vital Signs BP (!) 161/84 (BP Location: Right Arm)   Pulse 94   Temp 98.5 F (36.9 C) (Oral)   Resp 16   Ht 5\' 6"  (1.676 m)   Wt 93 kg (205 lb)   SpO2 95%   BMI 33.09 kg/m   Physical Exam  Constitutional: He appears well-developed and  well-nourished. No distress.  HENT:  Head: Normocephalic and atraumatic.  Dental cavities and poor oral dentition noted. Left lower gum line with one remaining molar which is tender to palpation. No abscess noted. Mild facial edema to affected area. No sublingual tenderness. Neck supple with no tenderness. Midline uvula. No trismus. OP moist and clear. No oropharyngeal erythema or edema.   Neck: Neck supple.  Cardiovascular: Normal rate, regular rhythm and normal heart sounds.  No murmur heard. Pulmonary/Chest: Effort normal and breath sounds normal. No respiratory distress. He has no wheezes. He has no  rales.  Musculoskeletal: Normal range of motion.  Neurological: He is alert.  Skin: Skin is warm and dry.  Nursing note and vitals reviewed.    ED Treatments / Results  Labs (all labs ordered are listed, but only abnormal results are displayed) Labs Reviewed - No data to display  EKG None  Radiology No results found.  Procedures Procedures (including critical care time)  Medications Ordered in ED Medications - No data to display   Initial Impression / Assessment and Plan / ED Course  I have reviewed the triage vital signs and the nursing notes.  Pertinent labs & imaging results that were available during my care of the patient were reviewed by me and considered in my medical decision making (see chart for details).    Jeffery Graves is a 62 y.o. male who presents to ED for dental pain. No abscess requiring immediate incision and drainage. Patient is afebrile, non toxic appearing, and swallowing secretions well. Exam not concerning for Ludwig's angina or pharyngeal abscess. Will treat with PenVK. I provided dental resource guide and stressed the importance of dental follow up for ultimate management of dental pain. Patient voices understanding and is agreeable to plan.   Final Clinical Impressions(s) / ED Diagnoses   Final diagnoses:  Pain, dental    ED Discharge Orders        Ordered    penicillin v potassium (VEETID) 500 MG tablet  3 times daily     12/23/17 0833       Nasiyah Laverdiere, Ozella Almond, PA-C 12/23/17 3748    Drenda Freeze, MD 12/23/17 [phone]

## 2018-01-02 ENCOUNTER — Other Ambulatory Visit: Payer: Self-pay | Admitting: Family Medicine

## 2018-01-02 DIAGNOSIS — I10 Essential (primary) hypertension: Secondary | ICD-10-CM

## 2018-01-02 MED FILL — LISINOPRIL-HCTZ 20-25 MG TA: 20-25 | 30 days supply | Qty: 30 | Fill #0

## 2018-01-02 MED FILL — ?TAMSULOSIN HCL 0.4 MG CAP: 0.4 | 30 days supply | Qty: 30 | Fill #4

## 2018-01-02 MED FILL — AMLODIPINE BESYLATE 10 MG T: 10 | 30 days supply | Qty: 30 | Fill #4

## 2018-01-29 MED FILL — ?TAMSULOSIN HCL 0.4 MG CAP: 0.4 | 30 days supply | Qty: 30 | Fill #5

## 2018-01-29 MED FILL — LISINOPRIL-HCTZ 20-25 MG TA: 20-25 | 30 days supply | Qty: 30 | Fill #1

## 2018-01-29 MED FILL — AMLODIPINE BESYLATE 10 MG T: 10 | 30 days supply | Qty: 30 | Fill #5

## 2018-02-05 ENCOUNTER — Ambulatory Visit: Payer: Self-pay | Attending: Family Medicine

## 2018-02-05 ENCOUNTER — Ambulatory Visit: Payer: Self-pay

## 2018-02-05 DIAGNOSIS — N400 Enlarged prostate without lower urinary tract symptoms: Secondary | ICD-10-CM | POA: Insufficient documentation

## 2018-02-05 DIAGNOSIS — I10 Essential (primary) hypertension: Secondary | ICD-10-CM | POA: Insufficient documentation

## 2018-02-05 DIAGNOSIS — Z0000 Encounter for general adult medical examination without abnormal findings: Secondary | ICD-10-CM | POA: Insufficient documentation

## 2018-02-05 NOTE — Progress Notes (Signed)
Patient here for lab visit only 

## 2018-02-06 LAB — CMP14+EGFR
A/G RATIO: 1.5 (ref 1.2–2.2)
ALBUMIN: 4.4 g/dL (ref 3.6–4.8)
ALK PHOS: 69 IU/L (ref 39–117)
ALT: 27 IU/L (ref 0–44)
AST: 28 IU/L (ref 0–40)
BILIRUBIN TOTAL: 0.5 mg/dL (ref 0.0–1.2)
BUN / CREAT RATIO: 15 (ref 10–24)
BUN: 15 mg/dL (ref 8–27)
CHLORIDE: 99 mmol/L (ref 96–106)
CO2: 19 mmol/L — ABNORMAL LOW (ref 20–29)
Calcium: 9.2 mg/dL (ref 8.6–10.2)
Creatinine, Ser: 1.03 mg/dL (ref 0.76–1.27)
GFR calc non Af Amer: 79 mL/min/{1.73_m2} (ref >59)
GFR, EST AFRICAN AMERICAN: 91 mL/min/{1.73_m2} (ref >59)
GLOBULIN, TOTAL: 3 g/dL (ref 1.5–4.5)
Glucose: 98 mg/dL (ref 65–99)
Potassium: 3.7 mmol/L (ref 3.5–5.2)
SODIUM: 141 mmol/L (ref 134–144)
TOTAL PROTEIN: 7.4 g/dL (ref 6.0–8.5)

## 2018-02-06 LAB — CBC WITH DIFFERENTIAL/PLATELET
BASOS: 0 %
Basophils Absolute: 0 10*3/uL (ref 0.0–0.2)
EOS (ABSOLUTE): 0.1 10*3/uL (ref 0.0–0.4)
EOS: 2 %
HEMATOCRIT: 45.9 % (ref 37.5–51.0)
Hemoglobin: 15.2 g/dL (ref 13.0–17.7)
IMMATURE GRANULOCYTES: 1 %
Immature Grans (Abs): 0 10*3/uL (ref 0.0–0.1)
LYMPHS: 33 %
Lymphocytes Absolute: 1.7 10*3/uL (ref 0.7–3.1)
MCH: 27.4 pg (ref 26.6–33.0)
MCHC: 33.1 g/dL (ref 31.5–35.7)
MCV: 83 fL (ref 79–97)
MONOS ABS: 0.7 10*3/uL (ref 0.1–0.9)
Monocytes: 13 %
NEUTROS PCT: 51 %
Neutrophils Absolute: 2.7 10*3/uL (ref 1.4–7.0)
PLATELETS: 173 10*3/uL (ref 150–450)
RBC: 5.54 x10E6/uL (ref 4.14–5.80)
RDW: 14.4 % (ref 12.3–15.4)
WBC: 5.3 10*3/uL (ref 3.4–10.8)

## 2018-02-06 LAB — LIPID PANEL
CHOLESTEROL TOTAL: 182 mg/dL (ref 100–199)
Chol/HDL Ratio: 2.8 ratio (ref 0.0–5.0)
HDL: 65 mg/dL (ref >39)
LDL Calculated: 97 mg/dL (ref 0–99)
Triglycerides: 101 mg/dL (ref 0–149)
VLDL CHOLESTEROL CAL: 20 mg/dL (ref 5–40)

## 2018-02-06 LAB — PSA, TOTAL AND FREE
PROSTATE SPECIFIC AG, SERUM: 4.5 ng/mL — AB (ref 0.0–4.0)
PSA, Free Pct: 5.1 %
PSA, Free: 0.23 ng/mL

## 2018-02-07 ENCOUNTER — Telehealth: Payer: Self-pay

## 2018-02-07 NOTE — Telephone Encounter (Signed)
Patient was called and informed of lab results. Lab results will be sent over to Urology office.

## 2018-02-09 ENCOUNTER — Ambulatory Visit: Payer: Self-pay

## 2018-02-22 MED FILL — FLUTICASONE PROP 50 MCG SPR: 50 | 30 days supply | Qty: 16 | Fill #2

## 2018-03-05 MED FILL — LISINOPRIL-HCTZ 20-25 MG TA: 20-25 | 30 days supply | Qty: 30 | Fill #2

## 2018-03-05 MED FILL — AMLODIPINE BESYLATE 10 MG T: 10 | 30 days supply | Qty: 30 | Fill #6

## 2018-03-05 MED FILL — TAMSULOSIN HCL 0.4 MG CAP: 0.4 | 30 days supply | Qty: 30 | Fill #6

## 2018-03-27 ENCOUNTER — Ambulatory Visit: Payer: Self-pay | Attending: Family Medicine | Admitting: Family Medicine

## 2018-03-27 ENCOUNTER — Encounter: Payer: Self-pay | Admitting: Family Medicine

## 2018-03-27 VITALS — BP 136/85 | HR 90 | Temp 98.3°F | Ht 66.0 in | Wt 211.0 lb

## 2018-03-27 DIAGNOSIS — Z8546 Personal history of malignant neoplasm of prostate: Secondary | ICD-10-CM | POA: Insufficient documentation

## 2018-03-27 DIAGNOSIS — N400 Enlarged prostate without lower urinary tract symptoms: Secondary | ICD-10-CM | POA: Insufficient documentation

## 2018-03-27 DIAGNOSIS — Z1211 Encounter for screening for malignant neoplasm of colon: Secondary | ICD-10-CM

## 2018-03-27 DIAGNOSIS — Z79899 Other long term (current) drug therapy: Secondary | ICD-10-CM | POA: Insufficient documentation

## 2018-03-27 DIAGNOSIS — I10 Essential (primary) hypertension: Secondary | ICD-10-CM | POA: Insufficient documentation

## 2018-03-27 DIAGNOSIS — Z923 Personal history of irradiation: Secondary | ICD-10-CM | POA: Insufficient documentation

## 2018-03-27 MED ORDER — AMLODIPINE BESYLATE 10 MG PO TABS: 10.0000 mg | ORAL_TABLET | Freq: Every day | ORAL | 6 refills | Status: DC

## 2018-03-27 MED ORDER — LISINOPRIL-HYDROCHLOROTHIAZIDE 20-25 MG PO TABS: 1.0000 | ORAL_TABLET | Freq: Every day | ORAL | 2 refills | Status: DC

## 2018-03-27 MED ORDER — TAMSULOSIN HCL 0.4 MG PO CAPS: 0.4000 mg | ORAL_CAPSULE | Freq: Every day | ORAL | 6 refills | Status: DC

## 2018-03-27 NOTE — Progress Notes (Signed)
Subjective:  Patient ID: Jeffery Graves, male    DOB: May 31, 1956  Age: 62 y.o. MRN: [id number]  CC: Hypertension   HPI Jeffery Graves  is a 62 year old male with a history of Hypertension, history of Adenocarcinoma of the prostate (s/p treatment), Benign prostatic hypertrophy, allergic rhinitis who comes into the clinic for a follow up visit. He denies the presence of lower urinary tract symptoms and is closely followed by [facility].  His last PSA was slightly elevated at  4.5 in 01/2018. He tolerates his antihypertensive with no side effects from his medications. He denies additional concerns today and declines the flu shot.  Past Medical History:  Diagnosis Date  . BPH (benign prostatic hyperplasia)   . Hx of radiation therapy 09/06/10 - 10/29/10   prostate, seminal vesicles  . Hypertension   . Prostate cancer ([place]) 04/26/2010   gleason 7, vol 20.88 cc   History reviewed. No pertinent surgical history.  No Known Allergies   Outpatient Medications Prior to Visit  Medication Sig Dispense Refill  . fluticasone (FLONASE) 50 MCG/ACT nasal spray Place 2 sprays into both nostrils daily. 16 g 6  . amLODipine (NORVASC) 10 MG tablet Take 1 tablet (10 mg total) by mouth daily. 30 tablet 6  . lisinopril-hydrochlorothiazide (PRINZIDE,ZESTORETIC) 20-25 MG tablet TAKE 1 TABLET BY MOUTH DAILY. 30 tablet 2  . tamsulosin (FLOMAX) 0.4 MG CAPS capsule Take 1 capsule (0.4 mg total) by mouth daily after breakfast. 30 capsule 6   No facility-administered medications prior to visit.     ROS Review of Systems  Constitutional: Negative for activity change and appetite change.  HENT: Negative for sinus pressure and sore throat.   Eyes: Negative for visual disturbance.  Respiratory: Negative for cough, chest tightness and shortness of breath.   Cardiovascular: Negative for chest pain and leg swelling.  Gastrointestinal: Negative for abdominal distention, abdominal pain, constipation and diarrhea.    Endocrine: Negative.   Genitourinary: Negative for dysuria.  Musculoskeletal: Negative for joint swelling and myalgias.  Skin: Negative for rash.  Allergic/Immunologic: Negative.   Neurological: Negative for weakness, light-headedness and numbness.  Psychiatric/Behavioral: Negative for dysphoric mood and suicidal ideas.    Objective:  BP 136/85   Pulse 90   Temp 98.3 F (36.8 C) (Oral)   Ht 5\' 6"  (1.676 m)   Wt 211 lb (95.7 kg)   SpO2 98%   BMI 34.06 kg/m   BP/Weight 03/27/2018 12/23/2017 11/16/4079  Systolic BP 448 185 631  Diastolic BP 85 84 82  Wt. (Lbs) 211 205 211.4  BMI 34.06 33.09 34.12      Physical Exam  Constitutional: He is oriented to person, place, and time. He appears well-developed and well-nourished.  HENT:  Right Ear: External ear normal.  Left Ear: External ear normal.  Mouth/Throat: Oropharynx is clear and moist.  Cardiovascular: Normal rate, normal heart sounds and intact distal pulses.  No murmur heard. Pulmonary/Chest: Effort normal and breath sounds normal. He has no wheezes. He has no rales. He exhibits no tenderness.  Abdominal: Soft. Bowel sounds are normal. He exhibits no distension and no mass. There is no tenderness.  Musculoskeletal: Normal range of motion.  Neurological: He is alert and oriented to person, place, and time.  Skin: Skin is warm and dry.  Psychiatric: He has a normal mood and affect.     CMP Latest Ref Rng & Units 02/05/2018 09/11/2017 11/22/2016  Glucose 65 - 99 mg/dL 98 74 89  BUN 8 -  27 mg/dL 15 11 14   Creatinine 0.76 - 1.27 mg/dL 1.03 0.97 0.92  Sodium 134 - 144 mmol/L 141 140 136  Potassium 3.5 - 5.2 mmol/L 3.7 3.9 3.8  Chloride 96 - 106 mmol/L 99 100 95(L)  CO2 20 - 29 mmol/L 19(L) 23 24  Calcium 8.6 - 10.2 mg/dL 9.2 9.4 9.3  Total Protein 6.0 - 8.5 g/dL 7.4 8.0 7.5  Total Bilirubin 0.0 - 1.2 mg/dL 0.5 0.5 0.7  Alkaline Phos 39 - 117 IU/L 69 74 67  AST 0 - 40 IU/L 28 48(H) 25  ALT 0 - 44 IU/L 27 43 26    Lipid  Panel     Component Value Date/Time   CHOL 182 02/05/2018 0847   TRIG 101 02/05/2018 0847   HDL 65 02/05/2018 0847   CHOLHDL 2.8 02/05/2018 0847   CHOLHDL 2.1 11/11/2015 0902   VLDL 7 11/11/2015 0902   LDLCALC 97 02/05/2018 0847      Assessment & Plan:   1. Benign prostatic hyperplasia without lower urinary tract symptoms Controlled - tamsulosin (FLOMAX) 0.4 MG CAPS capsule; Take 1 capsule (0.4 mg total) by mouth daily after breakfast.  Dispense: 30 capsule; Refill: 6  2. Essential hypertension Controlled Counseled on blood pressure goal of less than 130/80, low-sodium, DASH diet, medication compliance, 150 minutes of moderate intensity exercise per week. Discussed medication compliance, adverse effects. - amLODipine (NORVASC) 10 MG tablet; Take 1 tablet (10 mg total) by mouth daily.  Dispense: 30 tablet; Refill: 6 - lisinopril-hydrochlorothiazide (PRINZIDE,ZESTORETIC) 20-25 MG tablet; Take 1 tablet by mouth daily.  Dispense: 30 tablet; Refill: 2  3. Screening for colon cancer - Ambulatory referral to Gastroenterology   Meds ordered this encounter  Medications  . tamsulosin (FLOMAX) 0.4 MG CAPS capsule    Sig: Take 1 capsule (0.4 mg total) by mouth daily after breakfast.    Dispense:  30 capsule    Refill:  6  . amLODipine (NORVASC) 10 MG tablet    Sig: Take 1 tablet (10 mg total) by mouth daily.    Dispense:  30 tablet    Refill:  6  . lisinopril-hydrochlorothiazide (PRINZIDE,ZESTORETIC) 20-25 MG tablet    Sig: Take 1 tablet by mouth daily.    Dispense:  30 tablet    Refill:  2    Follow-up: Return in about 6 months (around 09/27/2018) for Follow-up of chronic medical conditions.   Charlott Rakes MD

## 2018-03-27 NOTE — Patient Instructions (Signed)
Colonoscopy, Adult  A colonoscopy is an exam to look at the large intestine. It is done to check for problems, such as:  · Lumps (tumors).  · Growths (polyps).  · Swelling (inflammation).  · Bleeding.    What happens before the procedure?  Eating and drinking   Follow instructions from your doctor about eating and drinking. These instructions may include:  · A few days before the procedure - follow a low-fiber diet.  ? Avoid nuts.  ? Avoid seeds.  ? Avoid dried fruit.  ? Avoid raw fruits.  ? Avoid vegetables.  · 1-3 days before the procedure - follow a clear liquid diet. Avoid liquids that have red or purple dye. Drink only clear liquids, such as:  ? Clear broth or bouillon.  ? Black coffee or tea.  ? Clear juice.  ? Clear soft drinks or sports drinks.  ? Gelatin dessert.  ? Popsicles.  · On the day of the procedure - do not eat or drink anything during the 2 hours before the procedure.    Bowel prep   If you were prescribed an oral bowel prep:  · Take it as told by your doctor. Starting the day before your procedure, you will need to drink a lot of liquid. The liquid will cause you to poop (have bowel movements) until your poop is almost clear or light green.  · If your skin or butt gets irritated from diarrhea, you may:  ? Wipe the area with wipes that have medicine in them, such as adult wet wipes with aloe and vitamin E.  ? Put something on your skin that soothes the area, such as petroleum jelly.  · If you throw up (vomit) while drinking the bowel prep, take a break for up to 60 minutes. Then begin the bowel prep again. If you keep throwing up and you cannot take the bowel prep without throwing up, call your doctor.    General instructions   · Ask your doctor about changing or stopping your normal medicines. This is important if you take diabetes medicines or blood thinners.  · Plan to have someone take you home from the hospital or clinic.  What happens during the procedure?  · An IV tube may be put into one  of your veins.  · You will be given medicine to help you relax (sedative).  · To reduce your risk of infection:  ? Your doctors will wash their hands.  ? Your anal area will be washed with soap.  · You will be asked to lie on your side with your knees bent.  · Your doctor will get a long, thin, flexible tube ready. The tube will have a camera and a light on the end.  · The tube will be put into your anus.  · The tube will be gently put into your large intestine.  · Air will be delivered into your large intestine to keep it open. You may feel some pressure or cramping.  · The camera will be used to take photos.  · A small tissue sample may be removed from your body to be looked at under a microscope (biopsy). If any possible problems are found, the tissue will be sent to a lab for testing.  · If small growths are found, your doctor may remove them and have them checked for cancer.  · The tube that was put into your anus will be slowly removed.  The procedure may vary among doctors   and hospitals.  What happens after the procedure?  · Your doctor will check on you often until the medicines you were given have worn off.  · Do not drive for 24 hours after the procedure.  · You may have a small amount of blood in your poop.  · You may pass gas.  · You may have mild cramps or bloating in your belly (abdomen).  · It is up to you to get the results of your procedure. Ask your doctor, or the department performing the procedure, when your results will be ready.  This information is not intended to replace advice given to you by your health care provider. Make sure you discuss any questions you have with your health care provider.  Document Released: 08/20/2010 Document Revised: 05/18/2016 Document Reviewed: 09/29/2015  Elsevier Interactive Patient Education © 2017 [employer]vier Inc.

## 2018-04-03 MED FILL — ?LISINOPRIL-HCTZ 20/25 TAB: 20-25 | 30 days supply | Qty: 30 | Fill #0

## 2018-04-03 MED FILL — AMLODIPINE BESYLATE 10 MG T: 10 | 30 days supply | Qty: 30 | Fill #0

## 2018-04-03 MED FILL — ?TAMSULOSIN HCL 0.4 MG CAP: 0.4 | 30 days supply | Qty: 30 | Fill #0

## 2018-05-04 MED FILL — LISINOPRIL-HCTZ 20-25 MG TA: 20-25 | 30 days supply | Qty: 30 | Fill #1

## 2018-05-04 MED FILL — AMLODIPINE BESYLATE 10 MG T: 10 | 30 days supply | Qty: 30 | Fill #1

## 2018-05-04 MED FILL — ?TAMSULOSIN HCL 0.4 MG CAP: 0.4 | 30 days supply | Qty: 30 | Fill #1

## 2018-06-08 MED FILL — FLUTICASONE PROP 50 MCG SPR: 50 | 30 days supply | Qty: 16 | Fill #3

## 2018-06-08 MED FILL — LISINOPRIL-HCTZ 20-25 MG TA: 20-25 | 30 days supply | Qty: 30 | Fill #2

## 2018-06-08 MED FILL — TAMSULOSIN HCL 0.4 MG CAP: 0.4 | 30 days supply | Qty: 30 | Fill #2

## 2018-06-08 MED FILL — AMLODIPINE BESYLATE 10 MG T: 10 | 30 days supply | Qty: 30 | Fill #2

## 2018-07-13 ENCOUNTER — Other Ambulatory Visit: Payer: Self-pay | Admitting: Family Medicine

## 2018-07-13 DIAGNOSIS — I10 Essential (primary) hypertension: Secondary | ICD-10-CM

## 2018-07-13 MED FILL — TAMSULOSIN HCL 0.4 MG CAP: 0.4 | 30 days supply | Qty: 30 | Fill #3

## 2018-07-13 MED FILL — LISINOPRIL-HCTZ 20-25 MG TA: 20-25 | 30 days supply | Qty: 30 | Fill #0

## 2018-07-13 MED FILL — AMLODIPINE BESYLATE 10 MG T: 10 | 30 days supply | Qty: 30 | Fill #3

## 2018-08-14 MED FILL — LISINOPRIL-HCTZ 20-25 MG TA: 20-25 | 30 days supply | Qty: 30 | Fill #1

## 2018-08-14 MED FILL — TAMSULOSIN HCL 0.4 MG CAP: 0.4 | 30 days supply | Qty: 30 | Fill #4

## 2018-08-14 MED FILL — AMLODIPINE BESYLATE 10 MG T: 10 | 30 days supply | Qty: 30 | Fill #4

## 2018-08-17 ENCOUNTER — Ambulatory Visit: Payer: Self-pay | Attending: Family Medicine

## 2018-08-20 ENCOUNTER — Ambulatory Visit: Payer: Self-pay | Attending: Family Medicine

## 2018-08-20 DIAGNOSIS — I10 Essential (primary) hypertension: Secondary | ICD-10-CM | POA: Insufficient documentation

## 2018-08-20 DIAGNOSIS — C61 Malignant neoplasm of prostate: Secondary | ICD-10-CM | POA: Insufficient documentation

## 2018-08-23 ENCOUNTER — Ambulatory Visit: Payer: Self-pay

## 2018-08-27 ENCOUNTER — Telehealth: Payer: Self-pay | Admitting: Family Medicine

## 2018-08-27 NOTE — Telephone Encounter (Signed)
Pt called to request his lab results, please follow up

## 2018-08-28 NOTE — Telephone Encounter (Signed)
Patient was called and informed to contact his urologist for results.

## 2018-09-04 ENCOUNTER — Other Ambulatory Visit (HOSPITAL_COMMUNITY): Payer: Self-pay | Admitting: Urology

## 2018-09-04 DIAGNOSIS — C61 Malignant neoplasm of prostate: Secondary | ICD-10-CM

## 2018-09-13 MED FILL — TAMSULOSIN HCL 0.4 MG CAP: 0.4 | 30 days supply | Qty: 30 | Fill #5

## 2018-09-13 MED FILL — LISINOPRIL-HCTZ 20-25 MG TA: 20-25 | 30 days supply | Qty: 30 | Fill #2

## 2018-09-13 MED FILL — AMLODIPINE BESYLATE 10 MG T: 10 | 30 days supply | Qty: 30 | Fill #5

## 2018-09-18 ENCOUNTER — Ambulatory Visit (HOSPITAL_COMMUNITY)
Admission: RE | Admit: 2018-09-18 | Discharge: 2018-09-18 | Disposition: A | Discharge status: Home / Self Care | Payer: Self-pay | Source: Ambulatory Visit | Attending: Urology | Admitting: Urology

## 2018-09-18 ENCOUNTER — Encounter (HOSPITAL_COMMUNITY): Payer: Self-pay

## 2018-09-18 ENCOUNTER — Other Ambulatory Visit (HOSPITAL_COMMUNITY): Payer: Self-pay | Admitting: Urology

## 2018-09-18 DIAGNOSIS — C61 Malignant neoplasm of prostate: Secondary | ICD-10-CM | POA: Insufficient documentation

## 2018-09-18 MED ORDER — IOHEXOL 300 MG/ML  SOLN
100.0000 mL | Freq: Once | INTRAMUSCULAR | Status: AC | PRN
  Administered 2018-09-18: 100 mL via INTRAVENOUS

## 2018-09-18 MED ORDER — SODIUM CHLORIDE (PF) 0.9 % IJ SOLN
INTRAMUSCULAR | Status: AC
  Filled 2018-09-18: qty 50

## 2018-09-18 MED ORDER — TECHNETIUM TC 99M MEDRONATE IV KIT
21.9000 | PACK | Freq: Once | INTRAVENOUS | Status: AC | PRN
  Administered 2018-09-18: 21.9 via INTRAVENOUS

## 2018-10-12 MED FILL — TAMSULOSIN HCL 0.4 MG CAP: 0.4 | 30 days supply | Qty: 30 | Fill #0

## 2018-10-15 ENCOUNTER — Other Ambulatory Visit: Payer: Self-pay | Admitting: Family Medicine

## 2018-10-15 DIAGNOSIS — I10 Essential (primary) hypertension: Secondary | ICD-10-CM

## 2018-10-15 MED FILL — AMLODIPINE BESYLATE 10 MG T: 10 | 30 days supply | Qty: 30 | Fill #6

## 2018-10-17 ENCOUNTER — Other Ambulatory Visit: Payer: Self-pay | Admitting: Family Medicine

## 2018-10-17 DIAGNOSIS — I10 Essential (primary) hypertension: Secondary | ICD-10-CM

## 2018-10-19 MED FILL — LISINOPRIL-HCTZ 20-25 MG TA: 20-25 | 30 days supply | Qty: 30 | Fill #0

## 2018-10-24 ENCOUNTER — Other Ambulatory Visit: Payer: Self-pay | Admitting: Family Medicine

## 2018-10-24 DIAGNOSIS — I10 Essential (primary) hypertension: Secondary | ICD-10-CM

## 2018-11-01 ENCOUNTER — Encounter: Payer: Self-pay | Admitting: Family Medicine

## 2018-11-01 ENCOUNTER — Other Ambulatory Visit: Payer: Self-pay

## 2018-11-01 ENCOUNTER — Ambulatory Visit: Payer: Self-pay | Attending: Family Medicine | Admitting: Family Medicine

## 2018-11-01 DIAGNOSIS — I10 Essential (primary) hypertension: Secondary | ICD-10-CM

## 2018-11-01 DIAGNOSIS — N400 Enlarged prostate without lower urinary tract symptoms: Secondary | ICD-10-CM

## 2018-11-01 DIAGNOSIS — Z8546 Personal history of malignant neoplasm of prostate: Secondary | ICD-10-CM

## 2018-11-01 MED ORDER — LISINOPRIL-HYDROCHLOROTHIAZIDE 20-25 MG PO TABS: 1.0000 | ORAL_TABLET | Freq: Every day | ORAL | 1 refills | Status: DC

## 2018-11-01 MED ORDER — FLUTICASONE PROPIONATE 50 MCG/ACT NA SUSP: 2.0000 | Freq: Every day | NASAL | 6 refills | Status: DC

## 2018-11-01 MED ORDER — TAMSULOSIN HCL 0.4 MG PO CAPS: 0.4000 mg | ORAL_CAPSULE | Freq: Every day | ORAL | 1 refills | Status: DC

## 2018-11-01 MED ORDER — AMLODIPINE BESYLATE 10 MG PO TABS: 10.0000 mg | ORAL_TABLET | Freq: Every day | ORAL | 1 refills | Status: DC

## 2018-11-01 NOTE — Progress Notes (Signed)
Patient has been called and DOB has been verified. Patient has been screened and transferred to PCP to start phone visit.  C/C: Hypertention  Refills: all meds.

## 2018-11-01 NOTE — Progress Notes (Signed)
Virtual Visit via Telephone Note  I connected with Chaney Malling on 11/01/18 at 10:50 AM EDT by telephone and verified that I am speaking with the correct person using two identifiers.   I discussed the limitations, risks, security and privacy concerns of performing an evaluation and management service by telephone and the availability of in person appointments. I also discussed with the patient that there may be a patient responsible charge related to this service. The patient expressed understanding and agreed to proceed.   History of Present Illness: Jeffery Graves  is a 63 year old male with a history of Hypertension, history of Adenocarcinoma of the prostate (s/p treatment), Benign prostatic hypertrophy, allergic rhinitis who comes into the clinic for a follow up visit. He denies the presence of lower urinary tract symptoms and is closely followed by [facility].  His last PSA was slightly elevated at  4.5 in 01/2018. He had a bone scan in 07/19/2019 which revealed no scintigraphic evidence of osseous metastatic disease. He has no acute concerns today.  Observations/Objective: AAOX3 Not in acute distress Normal mood  CMP Latest Ref Rng & Units 02/05/2018 09/11/2017 11/22/2016  Glucose 65 - 99 mg/dL 98 74 89  BUN 8 - 27 mg/dL '15 11 14  ' Creatinine 0.76 - 1.27 mg/dL 1.03 0.97 0.92  Sodium 134 - 144 mmol/L 141 140 136  Potassium 3.5 - 5.2 mmol/L 3.7 3.9 3.8  Chloride 96 - 106 mmol/L 99 100 95(L)  CO2 20 - 29 mmol/L 19(L) 23 24  Calcium 8.6 - 10.2 mg/dL 9.2 9.4 9.3  Total Protein 6.0 - 8.5 g/dL 7.4 8.0 7.5  Total Bilirubin 0.0 - 1.2 mg/dL 0.5 0.5 0.7  Alkaline Phos 39 - 117 IU/L 69 74 67  AST 0 - 40 IU/L 28 48(H) 25  ALT 0 - 44 IU/L 27 43 26    Lipid Panel     Component Value Date/Time   CHOL 182 02/05/2018 0847   TRIG 101 02/05/2018 0847   HDL 65 02/05/2018 0847   CHOLHDL 2.8 02/05/2018 0847   CHOLHDL 2.1 11/11/2015 0902   VLDL 7 11/11/2015 0902   LDLCALC 97 02/05/2018 0847     Assessment and Plan: 1. Essential hypertension Unable to assess control at this time due to virtual visit Continue current management Counseled on blood pressure goal of less than 130/80, low-sodium, DASH diet, medication compliance, 150 minutes of moderate intensity exercise per week. Discussed medication compliance, adverse effects. - Lipid panel; Future - CMP14+EGFR; Future - amLODipine (NORVASC) 10 MG tablet; Take 1 tablet (10 mg total) by mouth daily.  Dispense: 90 tablet; Refill: 1 - lisinopril-hydrochlorothiazide (PRINZIDE,ZESTORETIC) 20-25 MG tablet; Take 1 tablet by mouth daily. Must keep upcoming appt for more refills.  Dispense: 90 tablet; Refill: 1  2. Benign prostatic hyperplasia without lower urinary tract symptoms Stable - tamsulosin (FLOMAX) 0.4 MG CAPS capsule; Take 1 capsule (0.4 mg total) by mouth daily after breakfast.  Dispense: 90 capsule; Refill: 1  3. History of prostate cancer Followed by [facility] - PSA, total and free; Future   Follow Up Instructions:    I discussed the assessment and treatment plan with the patient. The patient was provided an opportunity to ask questions and all were answered. The patient agreed with the plan and demonstrated an understanding of the instructions.   The patient was advised to call back or seek an in-person evaluation if the symptoms worsen or if the condition fails to improve as anticipated.  I provided 11 minutes of non-face-to-face time during this encounter.   Charlott Rakes, MD

## 2018-11-05 ENCOUNTER — Other Ambulatory Visit: Payer: Self-pay

## 2018-11-05 ENCOUNTER — Ambulatory Visit: Payer: Self-pay | Attending: Family Medicine

## 2018-11-05 DIAGNOSIS — Z8546 Personal history of malignant neoplasm of prostate: Secondary | ICD-10-CM

## 2018-11-05 DIAGNOSIS — I10 Essential (primary) hypertension: Secondary | ICD-10-CM

## 2018-11-06 LAB — CMP14+EGFR
ALT: 22 IU/L (ref 0–44)
AST: 21 IU/L (ref 0–40)
Albumin/Globulin Ratio: 1.5 (ref 1.2–2.2)
Albumin: 4.3 g/dL (ref 3.8–4.9)
Alkaline Phosphatase: 71 IU/L (ref 39–117)
BUN/Creatinine Ratio: 15 (ref 10–24)
BUN: 14 mg/dL (ref 8–27)
Bilirubin Total: 1 mg/dL (ref 0.0–1.2)
CO2: 22 mmol/L (ref 20–29)
Calcium: 9.1 mg/dL (ref 8.6–10.2)
Chloride: 101 mmol/L (ref 96–106)
Creatinine, Ser: 0.96 mg/dL (ref 0.76–1.27)
GFR calc Af Amer: 99 mL/min/{1.73_m2} (ref >59)
GFR calc non Af Amer: 86 mL/min/{1.73_m2} (ref >59)
Globulin, Total: 2.8 g/dL (ref 1.5–4.5)
Glucose: 99 mg/dL (ref 65–99)
Potassium: 3.7 mmol/L (ref 3.5–5.2)
Sodium: 139 mmol/L (ref 134–144)
Total Protein: 7.1 g/dL (ref 6.0–8.5)

## 2018-11-06 LAB — LIPID PANEL
Chol/HDL Ratio: 3 ratio (ref 0.0–5.0)
Cholesterol, Total: 140 mg/dL (ref 100–199)
HDL: 46 mg/dL (ref >39)
LDL Calculated: 87 mg/dL (ref 0–99)
Triglycerides: 36 mg/dL (ref 0–149)
VLDL Cholesterol Cal: 7 mg/dL (ref 5–40)

## 2018-11-06 LAB — PSA, TOTAL AND FREE
PSA, Free Pct: 5.6 %
PSA, Free: 0.22 ng/mL
Prostate Specific Ag, Serum: 3.9 ng/mL (ref 0.0–4.0)

## 2018-11-08 ENCOUNTER — Telehealth: Payer: Self-pay

## 2018-11-08 MED FILL — ?AMLODIPINE BESYLATE 10 MG: 10 | 90 days supply | Qty: 90 | Fill #0

## 2018-11-08 MED FILL — FLUTICASONE PROP 50 MCG SPR: 50 | 30 days supply | Qty: 16 | Fill #0

## 2018-11-08 MED FILL — TAMSULOSIN HCL 0.4 MG CAP: 0.4 | 30 days supply | Qty: 30 | Fill #1

## 2018-11-08 MED FILL — LISINOPRIL-HCTZ 20-25 MG TA: 20-25 | 90 days supply | Qty: 90 | Fill #0

## 2018-11-08 NOTE — Telephone Encounter (Signed)
-----   Message from Charlott Rakes, MD sent at 11/06/2018  1:32 PM EDT ----- Labs are normal including PSA.  Could you please fax a copy of his labs to [facility]?  Thank you

## 2018-11-08 NOTE — Telephone Encounter (Signed)
Patient name and DOB has been verified Patient was informed of lab results. Patient had no questions.  

## 2018-11-10 NOTE — Telephone Encounter (Signed)
error 

## 2018-12-17 MED FILL — TAMSULOSIN HCL 0.4 MG CAP: 0.4 | 30 days supply | Qty: 30 | Fill #2

## 2019-01-10 MED FILL — TAMSULOSIN HCL 0.4 MG CAP: 0.4 | 30 days supply | Qty: 30 | Fill #3

## 2019-02-13 MED FILL — TAMSULOSIN HCL 0.4 MG CAP: 0.4 | 30 days supply | Qty: 30 | Fill #4

## 2019-02-13 MED FILL — ?AMLODIPINE BESYLATE 10 MG: 10 | 90 days supply | Qty: 90 | Fill #1

## 2019-02-13 MED FILL — LISINOPRIL-HCTZ 20-25 MG TA: 20-25 | 90 days supply | Qty: 90 | Fill #1

## 2019-03-13 ENCOUNTER — Other Ambulatory Visit: Payer: Self-pay | Admitting: Family Medicine

## 2019-03-13 DIAGNOSIS — I10 Essential (primary) hypertension: Secondary | ICD-10-CM

## 2019-03-13 MED FILL — TAMSULOSIN HCL 0.4 MG CAP: 0.4 | 30 days supply | Qty: 30 | Fill #5

## 2019-04-01 ENCOUNTER — Telehealth: Payer: Self-pay | Admitting: Family Medicine

## 2019-04-01 NOTE — Telephone Encounter (Signed)
Patient called back in regards to missed call  

## 2019-04-01 NOTE — Telephone Encounter (Signed)
Patient came in wanting to speak to PCP in regards to some blood work. Please follow up

## 2019-04-01 NOTE — Telephone Encounter (Signed)
Patient was called and a voicemail was left informing patient to return phone call. 

## 2019-04-10 ENCOUNTER — Other Ambulatory Visit: Payer: Self-pay

## 2019-04-10 ENCOUNTER — Ambulatory Visit: Payer: Self-pay | Attending: Family Medicine | Admitting: Family Medicine

## 2019-04-10 ENCOUNTER — Encounter: Payer: Self-pay | Admitting: Family Medicine

## 2019-04-10 VITALS — BP 145/89 | HR 84 | Temp 98.5°F | Ht 66.0 in | Wt 188.6 lb

## 2019-04-10 DIAGNOSIS — Z8546 Personal history of malignant neoplasm of prostate: Secondary | ICD-10-CM

## 2019-04-10 DIAGNOSIS — Z1211 Encounter for screening for malignant neoplasm of colon: Secondary | ICD-10-CM

## 2019-04-10 DIAGNOSIS — N400 Enlarged prostate without lower urinary tract symptoms: Secondary | ICD-10-CM

## 2019-04-10 DIAGNOSIS — I10 Essential (primary) hypertension: Secondary | ICD-10-CM

## 2019-04-10 DIAGNOSIS — N529 Male erectile dysfunction, unspecified: Secondary | ICD-10-CM

## 2019-04-10 MED ORDER — LISINOPRIL-HYDROCHLOROTHIAZIDE 20-25 MG PO TABS: 1.0000 | ORAL_TABLET | Freq: Every day | ORAL | 1 refills | Status: DC

## 2019-04-10 MED ORDER — AMLODIPINE BESYLATE 10 MG PO TABS: 10.0000 mg | ORAL_TABLET | Freq: Every day | ORAL | 1 refills | Status: DC

## 2019-04-10 MED ORDER — TAMSULOSIN HCL 0.4 MG PO CAPS: 0.4000 mg | ORAL_CAPSULE | Freq: Every day | ORAL | 1 refills | Status: DC

## 2019-04-10 MED ORDER — SILDENAFIL CITRATE 50 MG PO TABS: 50.0000 mg | ORAL_TABLET | Freq: Every day | ORAL | 1 refills | Status: DC | PRN

## 2019-04-10 MED FILL — TAMSULOSIN HCL 0.4 MG CAP: 0.4 | 30 days supply | Qty: 30 | Fill #0

## 2019-04-10 MED FILL — ?AMLODIPINE BESYLATE 10 MG: 10 | 30 days supply | Qty: 30 | Fill #0

## 2019-04-10 NOTE — Progress Notes (Signed)
Subjective:  Patient ID: Jeffery Graves, male    DOB: Aug 01, 1956  Age: 63 y.o. MRN: [id number]  CC:  Chief Complaint  Patient presents with  . Hypertension     HPI Jeffery Graves is a 63 year old male with a history of Hypertension, history of Adenocarcinoma of the prostate (s/p treatment), Benign prostatic hypertrophy, allergic rhinitis who comes into the clinic for a follow up visit. He is requesting lab work as this will be faxed to his urologist with [facility]. Compliant with his antihypertensive and his BPH medications; denies urinary symptoms.  Appointment with [facility] comes up next week. Request medication for erectile dysfunction. Denies chest pain, dyspnea or additional concerns. With regards to healthcare maintenance he is due for flu shot and colonoscopy  Past Medical History:  Diagnosis Date  . BPH (benign prostatic hyperplasia)   . Hx of radiation therapy 09/06/10 - 10/29/10   prostate, seminal vesicles  . Hypertension   . Prostate cancer ([place]) 04/26/2010   gleason 7, vol 20.88 cc    History reviewed. No pertinent surgical history.  Family History  Problem Relation Age of Onset  . Stroke Mother     No Known Allergies  Outpatient Medications Prior to Visit  Medication Sig Dispense Refill  . fluticasone (FLONASE) 50 MCG/ACT nasal spray Place 2 sprays into both nostrils daily. 16 g 6  . amLODipine (NORVASC) 10 MG tablet Take 1 tablet (10 mg total) by mouth daily. 90 tablet 1  . lisinopril-hydrochlorothiazide (PRINZIDE,ZESTORETIC) 20-25 MG tablet Take 1 tablet by mouth daily. Must keep upcoming appt for more refills. 90 tablet 1  . tamsulosin (FLOMAX) 0.4 MG CAPS capsule Take 1 capsule (0.4 mg total) by mouth daily after breakfast. 90 capsule 1   No facility-administered medications prior to visit.      ROS Review of Systems  Constitutional: Negative for activity change and appetite change.  HENT: Negative for sinus pressure and sore throat.    Eyes: Negative for visual disturbance.  Respiratory: Negative for cough, chest tightness and shortness of breath.   Cardiovascular: Negative for chest pain and leg swelling.  Gastrointestinal: Negative for abdominal distention, abdominal pain, constipation and diarrhea.  Endocrine: Negative.   Genitourinary: Negative for dysuria.  Musculoskeletal: Negative for joint swelling and myalgias.  Skin: Negative for rash.  Allergic/Immunologic: Negative.   Neurological: Negative for weakness, light-headedness and numbness.  Psychiatric/Behavioral: Negative for dysphoric mood and suicidal ideas.    Objective:  BP (!) 145/89   Pulse 84   Temp 98.5 F (36.9 C) (Oral)   Ht 5\' 6"  (1.676 m)   Wt 188 lb 9.6 oz (85.5 kg)   SpO2 97%   BMI 30.44 kg/m   BP/Weight 04/10/2019 03/27/2018 [id number]  Systolic BP Q000111Q [id number] Q000111Q  Diastolic BP 89 85 84  Wt. (Lbs) 188.6 211 205  BMI 30.44 34.06 33.09      Physical Exam Constitutional:      Appearance: He is well-developed.  Cardiovascular:     Rate and Rhythm: Normal rate.     Heart sounds: Normal heart sounds. No murmur.  Pulmonary:     Effort: Pulmonary effort is normal.     Breath sounds: Normal breath sounds. No wheezing or rales.  Chest:     Chest wall: No tenderness.  Abdominal:     General: Bowel sounds are normal. There is no distension.     Palpations: Abdomen is soft. There is no mass.     Tenderness: There  is no abdominal tenderness.  Musculoskeletal: Normal range of motion.  Neurological:     Mental Status: He is alert and oriented to person, place, and time.     CMP Latest Ref Rng & Units 11/05/2018 02/05/2018 09/11/2017  Glucose 65 - 99 mg/dL 99 98 74  BUN 8 - 27 mg/dL 14 15 11   Creatinine 0.76 - 1.27 mg/dL 0.96 1.03 0.97  Sodium 134 - 144 mmol/L 139 141 140  Potassium 3.5 - 5.2 mmol/L 3.7 3.7 3.9  Chloride 96 - 106 mmol/L 101 99 100  CO2 20 - 29 mmol/L 22 19(L) 23  Calcium 8.6 - 10.2 mg/dL 9.1 9.2 9.4  Total Protein 6.0 - 8.5  g/dL 7.1 7.4 8.0  Total Bilirubin 0.0 - 1.2 mg/dL 1.0 0.5 0.5  Alkaline Phos 39 - 117 IU/L 71 69 74  AST 0 - 40 IU/L 21 28 48(H)  ALT 0 - 44 IU/L 22 27 43    Lipid Panel     Component Value Date/Time   CHOL 140 11/05/2018 0848   TRIG 36 11/05/2018 0848   HDL 46 11/05/2018 0848   CHOLHDL 3.0 11/05/2018 0848   CHOLHDL 2.1 11/11/2015 0902   VLDL 7 11/11/2015 0902   LDLCALC 87 11/05/2018 0848    CBC    Component Value Date/Time   WBC 5.3 02/05/2018 0847   WBC 4.3 05/29/2013 1426   RBC 5.54 02/05/2018 0847   RBC 5.32 05/29/2013 1426   HGB 15.2 02/05/2018 0847   HCT 45.9 02/05/2018 0847   PLT 173 02/05/2018 0847   MCV 83 02/05/2018 0847   MCH 27.4 02/05/2018 0847   MCH 26.5 05/29/2013 1426   MCHC 33.1 02/05/2018 0847   MCHC 34.7 05/29/2013 1426   RDW 14.4 02/05/2018 0847   LYMPHSABS 1.7 02/05/2018 0847   MONOABS 0.6 01/27/2010 2219   EOSABS 0.1 02/05/2018 0847   BASOSABS 0.0 02/05/2018 0847    Lab Results  Component Value Date   HGBA1C 5.5 02/23/2015    Assessment & Plan:   1. Essential hypertension Slightly elevated No regimen change today Counseled on blood pressure goal of less than 130/80, low-sodium, DASH diet, medication compliance, 150 minutes of moderate intensity exercise per week. Discussed medication compliance, adverse effects. - amLODipine (NORVASC) 10 MG tablet; Take 1 tablet (10 mg total) by mouth daily.  Dispense: 90 tablet; Refill: 1 - lisinopril-hydrochlorothiazide (ZESTORETIC) 20-25 MG tablet; Take 1 tablet by mouth daily. Must keep upcoming appt for more refills.  Dispense: 90 tablet; Refill: 1 - Basic Metabolic Panel  2. Benign prostatic hyperplasia without lower urinary tract symptoms Stable - tamsulosin (FLOMAX) 0.4 MG CAPS capsule; Take 1 capsule (0.4 mg total) by mouth daily after breakfast.  Dispense: 90 capsule; Refill: 1  3. History of prostate cancer Stable Has upcoming appointment with [facility] - PSA, total and free  4.  Screening for colon cancer Declined colonoscopy referral - Fecal occult blood, imunochemical([facility])  5. Erectile dysfunction, unspecified erectile dysfunction type - sildenafil (VIAGRA) 50 MG tablet; Take 1 tablet (50 mg total) by mouth daily as needed for erectile dysfunction. At least 24 hours between doses  Dispense: 10 tablet; Refill: 1   Health Care Maintenance: See #4 above Meds ordered this encounter  Medications  . amLODipine (NORVASC) 10 MG tablet    Sig: Take 1 tablet (10 mg total) by mouth daily.    Dispense:  90 tablet    Refill:  1  . lisinopril-hydrochlorothiazide (ZESTORETIC) 20-25 MG tablet  Sig: Take 1 tablet by mouth daily. Must keep upcoming appt for more refills.    Dispense:  90 tablet    Refill:  1  . tamsulosin (FLOMAX) 0.4 MG CAPS capsule    Sig: Take 1 capsule (0.4 mg total) by mouth daily after breakfast.    Dispense:  90 capsule    Refill:  1  . sildenafil (VIAGRA) 50 MG tablet    Sig: Take 1 tablet (50 mg total) by mouth daily as needed for erectile dysfunction. At least 24 hours between doses    Dispense:  10 tablet    Refill:  1    Follow-up: Return in about 6 months (around 10/08/2019) for medical conditions.       Charlott Rakes, MD, FAAFP. [facility] and [facility] [facility], [place]   04/10/2019, 4:46 PM

## 2019-04-11 LAB — BASIC METABOLIC PANEL
BUN/Creatinine Ratio: 16 (ref 10–24)
BUN: 13 mg/dL (ref 8–27)
CO2: 24 mmol/L (ref 20–29)
Calcium: 9.5 mg/dL (ref 8.6–10.2)
Chloride: 95 mmol/L — ABNORMAL LOW (ref 96–106)
Creatinine, Ser: 0.8 mg/dL (ref 0.76–1.27)
GFR calc Af Amer: 111 mL/min/{1.73_m2} (ref >59)
GFR calc non Af Amer: 96 mL/min/{1.73_m2} (ref >59)
Glucose: 90 mg/dL (ref 65–99)
Potassium: 3.6 mmol/L (ref 3.5–5.2)
Sodium: 137 mmol/L (ref 134–144)

## 2019-04-11 LAB — PSA, TOTAL AND FREE
PSA, Free Pct: 4.2 %
PSA, Free: 0.25 ng/mL
Prostate Specific Ag, Serum: 6 ng/mL — ABNORMAL HIGH (ref 0.0–4.0)

## 2019-04-12 ENCOUNTER — Telehealth: Payer: Self-pay

## 2019-04-12 NOTE — Telephone Encounter (Signed)
-----   Message from Charlott Rakes, MD sent at 04/11/2019 12:25 PM EDT ----- Kidney and liver functions are normal but PSA is slightly elevated. Could you please fax this over to [facility]? Thanks

## 2019-04-12 NOTE — Telephone Encounter (Signed)
Patient name and DOB has been verified Patient was informed of lab results. Patient had no questions.  Patient's lab results will be [facility]ed [facility] to [facility]

## 2019-05-13 MED FILL — LISINOPRIL-HCTZ 20-25 MG TA: 20-25 | 30 days supply | Qty: 30 | Fill #0

## 2019-05-13 MED FILL — TAMSULOSIN HCL 0.4 MG CAP: 0.4 | 30 days supply | Qty: 30 | Fill #1

## 2019-05-13 MED FILL — AMLODIPINE BESYLATE 10 MG T: 10 | 30 days supply | Qty: 30 | Fill #0

## 2019-06-11 MED FILL — TAMSULOSIN HCL 0.4 MG CAP: 0.4 | 30 days supply | Qty: 30 | Fill #2

## 2019-06-11 MED FILL — AMLODIPINE BESYLATE 10 MG T: 10 | 30 days supply | Qty: 30 | Fill #0

## 2019-06-11 MED FILL — LISINOPRIL-HCTZ 20-25 MG TA: 20-25 | 30 days supply | Qty: 30 | Fill #1

## 2019-07-12 MED FILL — LISINOPRIL-HCTZ 20-25 MG TA: 20-25 | 30 days supply | Qty: 30 | Fill #2

## 2019-07-12 MED FILL — AMLODIPINE BESYLATE 10 MG T: 10 | 30 days supply | Qty: 30 | Fill #1

## 2019-07-12 MED FILL — TAMSULOSIN HCL 0.4 MG CAP: 0.4 | 30 days supply | Qty: 30 | Fill #3

## 2019-08-03 ENCOUNTER — Other Ambulatory Visit: Payer: Self-pay | Admitting: Family Medicine

## 2019-08-03 DIAGNOSIS — N529 Male erectile dysfunction, unspecified: Secondary | ICD-10-CM

## 2019-08-06 ENCOUNTER — Other Ambulatory Visit: Payer: Self-pay | Admitting: Family Medicine

## 2019-08-06 DIAGNOSIS — N529 Male erectile dysfunction, unspecified: Secondary | ICD-10-CM

## 2019-08-15 MED FILL — TAMSULOSIN HCL 0.4 MG CAP: 0.4 | 30 days supply | Qty: 30 | Fill #4

## 2019-08-15 MED FILL — LISINOPRIL-HCTZ 20-25 MG TA: 20-25 | 30 days supply | Qty: 30 | Fill #3

## 2019-08-15 MED FILL — AMLODIPINE BESYLATE 10 MG T: 10 | 30 days supply | Qty: 30 | Fill #2

## 2019-09-11 MED FILL — TAMSULOSIN HCL 0.4 MG CAP: 0.4 | 30 days supply | Qty: 30 | Fill #5

## 2019-09-11 MED FILL — LISINOPRIL-HCTZ 20-25 MG TA: 20-25 | 30 days supply | Qty: 30 | Fill #4

## 2019-09-11 MED FILL — AMLODIPINE BESYLATE 10 MG T: 10 | 30 days supply | Qty: 30 | Fill #3

## 2019-10-11 MED FILL — TAMSULOSIN HCL 0.4 MG CAP: 0.4 | 30 days supply | Qty: 30 | Fill #0

## 2019-10-11 MED FILL — LISINOPRIL-HCTZ 20-25 MG TA: 20-25 | 30 days supply | Qty: 30 | Fill #5

## 2019-10-11 MED FILL — AMLODIPINE BESYLATE 10 MG T: 10 | 30 days supply | Qty: 30 | Fill #4

## 2019-10-18 ENCOUNTER — Ambulatory Visit: Payer: Self-pay | Attending: Family Medicine

## 2019-10-18 ENCOUNTER — Other Ambulatory Visit: Payer: Self-pay

## 2019-10-18 DIAGNOSIS — Z8546 Personal history of malignant neoplasm of prostate: Secondary | ICD-10-CM

## 2019-10-18 DIAGNOSIS — N400 Enlarged prostate without lower urinary tract symptoms: Secondary | ICD-10-CM

## 2019-10-18 NOTE — Progress Notes (Unsigned)
Pt. Came in today with a lab requisition from his [facility]ist for Prostate.

## 2019-10-19 LAB — PSA, TOTAL AND FREE
PSA, Free Pct: 4.9 %
PSA, Free: 0.22 ng/mL
Prostate Specific Ag, Serum: 4.5 ng/mL — ABNORMAL HIGH (ref 0.0–4.0)

## 2019-10-21 ENCOUNTER — Other Ambulatory Visit: Payer: Self-pay | Admitting: Family Medicine

## 2019-10-21 DIAGNOSIS — I10 Essential (primary) hypertension: Secondary | ICD-10-CM

## 2019-10-24 ENCOUNTER — Telehealth: Payer: Self-pay

## 2019-10-24 NOTE — Telephone Encounter (Signed)
-----   Message from Charlott Rakes, MD sent at 10/21/2019  8:05 AM EDT ----- Please inform him his PSA is 4.5 which is slightly improved from 6.0 previously.  Can you please fax this report to his urologist-[facility]?  Please have the lab add on a basic metabolic panel which I have ordered.  Thank you.

## 2019-10-24 NOTE — Telephone Encounter (Signed)
Patient name and DOB has been verified Patient was informed of lab results. Patient had no questions.  

## 2019-10-25 LAB — SPECIMEN STATUS REPORT

## 2019-11-12 ENCOUNTER — Other Ambulatory Visit: Payer: Self-pay | Admitting: Family Medicine

## 2019-11-12 DIAGNOSIS — I10 Essential (primary) hypertension: Secondary | ICD-10-CM

## 2019-11-12 MED FILL — LISINOPRIL-HCTZ 20-25 MG TA: 20-25 | 30 days supply | Qty: 30 | Fill #0

## 2019-11-12 MED FILL — TAMSULOSIN HCL 0.4 MG CAP: 0.4 | 30 days supply | Qty: 30 | Fill #1

## 2019-11-12 MED FILL — AMLODIPINE BESYLATE 10 MG T: 10 | 30 days supply | Qty: 30 | Fill #5

## 2019-11-18 ENCOUNTER — Encounter: Payer: Self-pay | Admitting: Family Medicine

## 2019-11-18 ENCOUNTER — Ambulatory Visit: Payer: Self-pay | Attending: Family Medicine | Admitting: Family Medicine

## 2019-11-18 ENCOUNTER — Other Ambulatory Visit: Payer: Self-pay | Admitting: Family Medicine

## 2019-11-18 ENCOUNTER — Other Ambulatory Visit: Payer: Self-pay

## 2019-11-18 VITALS — BP 130/76 | HR 85 | Ht 66.0 in | Wt 192.0 lb

## 2019-11-18 DIAGNOSIS — Z1211 Encounter for screening for malignant neoplasm of colon: Secondary | ICD-10-CM

## 2019-11-18 DIAGNOSIS — I10 Essential (primary) hypertension: Secondary | ICD-10-CM

## 2019-11-18 DIAGNOSIS — N400 Enlarged prostate without lower urinary tract symptoms: Secondary | ICD-10-CM

## 2019-11-18 MED ORDER — TAMSULOSIN HCL 0.4 MG PO CAPS: 0.4000 mg | ORAL_CAPSULE | Freq: Every day | ORAL | 1 refills | Status: DC

## 2019-11-18 MED ORDER — AMLODIPINE BESYLATE 10 MG PO TABS: 10.0000 mg | ORAL_TABLET | Freq: Every day | ORAL | 1 refills | Status: DC

## 2019-11-18 MED ORDER — LISINOPRIL-HYDROCHLOROTHIAZIDE 20-25 MG PO TABS: 1.0000 | ORAL_TABLET | Freq: Every day | ORAL | 1 refills | Status: DC

## 2019-11-18 NOTE — Progress Notes (Signed)
Subjective:  Patient ID: Jeffery Graves, male    DOB: [birth year]  Age: 64 y.o. MRN: [id number]  CC: Hypertension   HPI KANARD FORS  is a 64 year old male with a history of Hypertension, history of Adenocarcinoma of the prostate (s/p treatment), Benign prostatic hypertrophy, allergic rhinitis who comes into the clinic for a follow up visit Last PSA was 4.5 in 09/2018 and his last visit with Urology was 2 weeks ago. Currently on Flomax and denies presence of lower urinary tract symptoms.  He is compliant with his antihypertensive and denies adverse effects from it.  Has not needed Flonase lately and chronic rhinitis symptoms are stable. He is requesting referral to GI for colonoscopy. Denies presence of chest pain, dyspnea or paroxysmal nocturnal dyspnea.  Past Medical History:  Diagnosis Date  . BPH (benign prostatic hyperplasia)   . Hx of radiation therapy 09/06/10 - 10/29/10   prostate, seminal vesicles  . Hypertension   . Prostate cancer ([place]) 04/26/2010   gleason 7, vol 20.88 cc    No past surgical history on file.  Family History  Problem Relation Age of Onset  . Stroke Mother     No Known Allergies  Outpatient Medications Prior to Visit  Medication Sig Dispense Refill  . amLODipine (NORVASC) 10 MG tablet Take 1 tablet (10 mg total) by mouth daily. 90 tablet 1  . fluticasone (FLONASE) 50 MCG/ACT nasal spray Place 2 sprays into both nostrils daily. 16 g 6  . lisinopril-hydrochlorothiazide (ZESTORETIC) 20-25 MG tablet TAKE 1 TABLET BY MOUTH DAILY. MUST KEEP UPCOMING APPT FOR MORE REFILLS. 30 tablet 0  . sildenafil (VIAGRA) 50 MG tablet TAKE 1 TABLET BY MOUTH ONCE DAILY AS NEEDED KFOR  ERECTILE  DYSFUNCTION.  AT  LEAST  24  HOURS  BETWEEN  DOSES 10 tablet 0  . tamsulosin (FLOMAX) 0.4 MG CAPS capsule Take 1 capsule (0.4 mg total) by mouth daily after breakfast. 90 capsule 1   No facility-administered medications prior to visit.     ROS Review of Systems  Constitutional:  Negative for activity change and appetite change.  HENT: Negative for sinus pressure and sore throat.   Eyes: Negative for visual disturbance.  Respiratory: Negative for cough, chest tightness and shortness of breath.   Cardiovascular: Negative for chest pain and leg swelling.  Gastrointestinal: Negative for abdominal distention, abdominal pain, constipation and diarrhea.  Endocrine: Negative.   Genitourinary: Negative for dysuria.  Musculoskeletal: Negative for joint swelling and myalgias.  Skin: Negative for rash.  Allergic/Immunologic: Negative.   Neurological: Negative for weakness, light-headedness and numbness.  Psychiatric/Behavioral: Negative for dysphoric mood and suicidal ideas.    Objective:  BP 130/76   Pulse 85   Ht 5\' 6"  (1.676 m)   Wt 192 lb (87.1 kg)   SpO2 99%   BMI 30.99 kg/m   BP/Weight 11/18/2019 04/10/2019 [id number]  Systolic BP [id number] [id number]  Diastolic BP 76 89 85  Wt. (Lbs) 192 188.6 211  BMI 30.99 30.44 34.06      Physical Exam Constitutional:      Appearance: He is well-developed.  Neck:     Vascular: No JVD.  Cardiovascular:     Rate and Rhythm: Normal rate.     Heart sounds: Normal heart sounds. No murmur.  Pulmonary:     Effort: Pulmonary effort is normal.     Breath sounds: Normal breath sounds. No wheezing or rales.  Chest:     Chest wall: No tenderness.  Abdominal:     General: Bowel sounds are normal. There is no distension.     Palpations: Abdomen is soft. There is no mass.     Tenderness: There is no abdominal tenderness.  Musculoskeletal:        General: Normal range of motion.     Right lower leg: No edema.     Left lower leg: No edema.  Neurological:     Mental Status: He is alert and oriented to person, place, and time.  Psychiatric:        Mood and Affect: Mood normal.     CMP Latest Ref Rng & Units 04/10/2019 11/05/2018 02/05/2018  Glucose 65 - 99 mg/dL 90 99 98  BUN 8 - 27 mg/dL 13 14 15   Creatinine 0.76 - 1.27 mg/dL 0.80  0.96 1.03  Sodium 134 - 144 mmol/L 137 139 141  Potassium 3.5 - 5.2 mmol/L 3.6 3.7 3.7  Chloride 96 - 106 mmol/L 95(L) 101 99  CO2 20 - 29 mmol/L 24 22 19(L)  Calcium 8.6 - 10.2 mg/dL 9.5 9.1 9.2  Total Protein 6.0 - 8.5 g/dL - 7.1 7.4  Total Bilirubin 0.0 - 1.2 mg/dL - 1.0 0.5  Alkaline Phos 39 - 117 IU/L - 71 69  AST 0 - 40 IU/L - 21 28  ALT 0 - 44 IU/L - 22 27    Lipid Panel     Component Value Date/Time   CHOL 140 11/05/2018 0848   TRIG 36 11/05/2018 0848   HDL 46 11/05/2018 0848   CHOLHDL 3.0 11/05/2018 0848   CHOLHDL 2.1 11/11/2015 0902   VLDL 7 11/11/2015 0902   LDLCALC 87 11/05/2018 0848    CBC    Component Value Date/Time   WBC 5.3 02/05/2018 0847   WBC 4.3 05/29/2013 1426   RBC 5.54 02/05/2018 0847   RBC 5.32 05/29/2013 1426   HGB 15.2 02/05/2018 0847   HCT 45.9 02/05/2018 0847   PLT 173 02/05/2018 0847   MCV 83 02/05/2018 0847   MCH 27.4 02/05/2018 0847   MCH 26.5 05/29/2013 1426   MCHC 33.1 02/05/2018 0847   MCHC 34.7 05/29/2013 1426   RDW 14.4 02/05/2018 0847   LYMPHSABS 1.7 02/05/2018 0847   MONOABS 0.6 01/27/2010 2219   EOSABS 0.1 02/05/2018 0847   BASOSABS 0.0 02/05/2018 0847    Lab Results  Component Value Date   HGBA1C 5.5 02/23/2015    Assessment & Plan:  1. Essential hypertension Controlled Counseled on blood pressure goal of less than 130/80, low-sodium, DASH diet, medication compliance, 150 minutes of moderate intensity exercise per week. Discussed medication compliance, adverse effects. - lisinopril-hydrochlorothiazide (ZESTORETIC) 20-25 MG tablet; Take 1 tablet by mouth daily.  Dispense: 90 tablet; Refill: 1 - amLODipine (NORVASC) 10 MG tablet; Take 1 tablet (10 mg total) by mouth daily.  Dispense: 90 tablet; Refill: 1 - Basic Metabolic Panel  2. Benign prostatic hyperplasia without lower urinary tract symptoms Controlled Last PSA was close to normal at 4.5 Follow-up with urology - tamsulosin (FLOMAX) 0.4 MG CAPS capsule; Take 1  capsule (0.4 mg total) by mouth daily after breakfast.  Dispense: 90 capsule; Refill: 1  3. Screening for colon cancer He has been advised to apply for the [facility] financal discount to facilitate referral for colonoscopy - Ambulatory referral to Gastroenterology   Return in about 6 months (around 05/19/2020) for chronic diease management.      Charlott Rakes, MD, FAAFP. [place] and [place], [place]  [phone]   11/18/2019, 8:49 AM

## 2019-11-18 NOTE — Progress Notes (Signed)
Referral for colonoscopy °

## 2019-11-19 LAB — BASIC METABOLIC PANEL
BUN/Creatinine Ratio: 14 (ref 10–24)
BUN: 15 mg/dL (ref 8–27)
CO2: 25 mmol/L (ref 20–29)
Calcium: 9.3 mg/dL (ref 8.6–10.2)
Chloride: 100 mmol/L (ref 96–106)
Creatinine, Ser: 1.04 mg/dL (ref 0.76–1.27)
GFR calc Af Amer: 89 mL/min/{1.73_m2} (ref >59)
GFR calc non Af Amer: 77 mL/min/{1.73_m2} (ref >59)
Glucose: 93 mg/dL (ref 65–99)
Potassium: 3.7 mmol/L (ref 3.5–5.2)
Sodium: 138 mmol/L (ref 134–144)

## 2019-11-21 ENCOUNTER — Telehealth: Payer: Self-pay

## 2019-11-21 NOTE — Telephone Encounter (Signed)
Patient name and DOB has been verified Patient was informed of lab results. Patient had no questions.  

## 2019-11-21 NOTE — Telephone Encounter (Signed)
-----   Message from Charlott Rakes, MD sent at 11/19/2019  2:46 PM EDT ----- Please inform the patient that labs are normal. Thank you.

## 2019-12-11 ENCOUNTER — Telehealth: Payer: Self-pay | Admitting: Family Medicine

## 2019-12-11 DIAGNOSIS — N529 Male erectile dysfunction, unspecified: Secondary | ICD-10-CM

## 2019-12-11 NOTE — Telephone Encounter (Signed)
1) Medication(s) Requested (by name): sildenafil (VIAGRA) 50 MG  2) Pharmacy of Choice: - [address]  [address], [place] 3) Special Requests: Please have it in before [birth year] on [birth year]  Approved medications will be sent to the pharmacy, we will reach out if there is an issue.  Requests made after 3pm may not be addressed until the following business day!  If a patient is unsure of the name of the medication(s) please note and ask patient to call back when they are able to provide all info, do not send to responsible party until all information is available!

## 2019-12-11 NOTE — Telephone Encounter (Signed)
Will route to PCP 

## 2019-12-12 MED ORDER — SILDENAFIL CITRATE 50 MG PO TABS: ORAL_TABLET | ORAL | 0 refills | Status: DC

## 2019-12-12 NOTE — Telephone Encounter (Signed)
Done

## 2020-01-23 ENCOUNTER — Other Ambulatory Visit: Payer: Self-pay | Admitting: Family Medicine

## 2020-01-23 DIAGNOSIS — N529 Male erectile dysfunction, unspecified: Secondary | ICD-10-CM

## 2020-01-24 MED FILL — SILDENAFIL CITRATE 50 MG TA: 50 | 30 days supply | Qty: 10 | Fill #0

## 2020-02-12 MED FILL — TAMSULOSIN HCL 0.4 MG CAP: 0.4 | 30 days supply | Qty: 30 | Fill #2

## 2020-02-12 MED FILL — AMLODIPINE BESYLATE 10 MG T: 10 | 30 days supply | Qty: 30 | Fill #2

## 2020-02-12 MED FILL — LISINOPRIL-HYDROCHLOROTHIAZ: 20-25 | 30 days supply | Qty: 30 | Fill #2

## 2020-03-03 MED FILL — SILDENAFIL CITRATE 50 MG TA: 50 | 30 days supply | Qty: 10 | Fill #1

## 2020-03-11 MED FILL — LISINOPRIL-HYDROCHLOROTHIAZ: 20-25 | 30 days supply | Qty: 30 | Fill #3

## 2020-03-11 MED FILL — TAMSULOSIN HCL 0.4 MG CAP: 0.4 | 30 days supply | Qty: 30 | Fill #3

## 2020-03-11 MED FILL — AMLODIPINE BESYLATE 10 MG T: 10 | 30 days supply | Qty: 30 | Fill #3

## 2020-04-09 MED FILL — AMLODIPINE BESYLATE 10 MG T: 10 | 30 days supply | Qty: 30 | Fill #4

## 2020-04-09 MED FILL — TAMSULOSIN HCL 0.4 MG CAP: 0.4 | 30 days supply | Qty: 30 | Fill #4

## 2020-04-09 MED FILL — LISINOPRIL-HYDROCHLOROTHIAZ: 20-25 | 30 days supply | Qty: 30 | Fill #4

## 2020-04-17 ENCOUNTER — Other Ambulatory Visit: Payer: Self-pay

## 2020-04-17 ENCOUNTER — Ambulatory Visit: Payer: Self-pay | Attending: Family Medicine

## 2020-04-17 DIAGNOSIS — Z8546 Personal history of malignant neoplasm of prostate: Secondary | ICD-10-CM

## 2020-04-18 LAB — PSA, TOTAL AND FREE
PSA, Free Pct: 4.6 %
PSA, Free: 0.25 ng/mL
Prostate Specific Ag, Serum: 5.4 ng/mL — ABNORMAL HIGH (ref 0.0–4.0)

## 2020-04-29 MED FILL — SILDENAFIL CITRATE 50 MG TA: 50 | 30 days supply | Qty: 10 | Fill #2

## 2020-05-12 MED FILL — LISINOPRIL-HYDROCHLOROTHIAZ: 20-25 | 30 days supply | Qty: 30 | Fill #5

## 2020-05-12 MED FILL — TAMSULOSIN HCL 0.4 MG CAP: 0.4 | 30 days supply | Qty: 30 | Fill #5

## 2020-05-12 MED FILL — AMLODIPINE BESYLATE 10 MG T: 10 | 30 days supply | Qty: 30 | Fill #5

## 2020-05-18 ENCOUNTER — Ambulatory Visit: Payer: Self-pay | Attending: Family Medicine

## 2020-05-18 ENCOUNTER — Other Ambulatory Visit: Payer: Self-pay

## 2020-06-05 ENCOUNTER — Other Ambulatory Visit: Payer: Self-pay | Admitting: Family Medicine

## 2020-06-05 DIAGNOSIS — I10 Essential (primary) hypertension: Secondary | ICD-10-CM

## 2020-06-05 DIAGNOSIS — N400 Enlarged prostate without lower urinary tract symptoms: Secondary | ICD-10-CM

## 2020-06-05 MED FILL — LISINOPRIL-HYDROCHLOROTHIAZ: 20-25 | 30 days supply | Qty: 30 | Fill #0

## 2020-06-05 MED FILL — AMLODIPINE BESYLATE 10 MG T: 10 | 30 days supply | Qty: 30 | Fill #0

## 2020-06-05 MED FILL — TAMSULOSIN HCL 0.4 MG CAP: 0.4 | 30 days supply | Qty: 30 | Fill #0

## 2020-06-08 ENCOUNTER — Ambulatory Visit: Payer: Self-pay | Attending: Family Medicine | Admitting: Family Medicine

## 2020-06-08 ENCOUNTER — Encounter: Payer: Self-pay | Admitting: Family Medicine

## 2020-06-08 ENCOUNTER — Other Ambulatory Visit: Payer: Self-pay

## 2020-06-08 ENCOUNTER — Other Ambulatory Visit: Payer: Self-pay | Admitting: Family Medicine

## 2020-06-08 VITALS — BP 133/79 | HR 90 | Temp 99.1°F | Ht 66.0 in | Wt 196.0 lb

## 2020-06-08 DIAGNOSIS — Z1211 Encounter for screening for malignant neoplasm of colon: Secondary | ICD-10-CM

## 2020-06-08 DIAGNOSIS — N400 Enlarged prostate without lower urinary tract symptoms: Secondary | ICD-10-CM

## 2020-06-08 DIAGNOSIS — Z8546 Personal history of malignant neoplasm of prostate: Secondary | ICD-10-CM

## 2020-06-08 DIAGNOSIS — I10 Essential (primary) hypertension: Secondary | ICD-10-CM

## 2020-06-08 MED ORDER — TAMSULOSIN HCL 0.4 MG PO CAPS: 0.4000 mg | ORAL_CAPSULE | Freq: Every day | ORAL | 6 refills | Status: DC

## 2020-06-08 MED ORDER — LISINOPRIL-HYDROCHLOROTHIAZIDE 20-25 MG PO TABS: 1.0000 | ORAL_TABLET | Freq: Every day | ORAL | 6 refills | Status: DC

## 2020-06-08 MED ORDER — AMLODIPINE BESYLATE 10 MG PO TABS: 10.0000 mg | ORAL_TABLET | Freq: Every day | ORAL | 6 refills | Status: DC

## 2020-06-08 NOTE — Progress Notes (Signed)
Subjective:  Patient ID: Jeffery Graves, male    DOB: 04/25/1956  Age: 64 y.o. MRN: [id number]  CC: Hypertension   HPI Jeffery Graves is a 64year old male with a history of Hypertension, history of Adenocarcinoma of the prostate (s/p treatment), Benign prostatic hypertrophy, allergic rhinitis who comes into the clinic for a follow up visit Last PSA was 5.4 in 04/2020 and he is followed by [facility] and sees them early next year. Currently on Flomax and denies presence of lower urinary tract symptoms. Compliant with his antihypertensive and denies adverse effects of his medication. He has no additional concerns today.  Past Medical History:  Diagnosis Date  . BPH (benign prostatic hyperplasia)   . Hx of radiation therapy 09/06/10 - 10/29/10   prostate, seminal vesicles  . Hypertension   . Prostate cancer ([place]) 04/26/2010   gleason 7, vol 20.88 cc    No past surgical history on file.  Family History  Problem Relation Age of Onset  . Stroke Mother     No Known Allergies  Outpatient Medications Prior to Visit  Medication Sig Dispense Refill  . fluticasone (FLONASE) 50 MCG/ACT nasal spray Place 2 sprays into both nostrils daily. 16 g 6  . sildenafil (VIAGRA) 50 MG tablet TAKE 1 TABLET BY MOUTH ONCE DAILY AS NEEDED FOR ERECTILE DYSFUNCTION. AT LEAST 24 HOURS BETWEEN DOSES 10 tablet 2  . amLODipine (NORVASC) 10 MG tablet TAKE 1 TABLET (10 MG TOTAL) BY MOUTH DAILY. 90 tablet 0  . lisinopril-hydrochlorothiazide (ZESTORETIC) 20-25 MG tablet TAKE 1 TABLET BY MOUTH DAILY. 90 tablet 0  . tamsulosin (FLOMAX) 0.4 MG CAPS capsule TAKE 1 CAPSULE (0.4 MG TOTAL) BY MOUTH DAILY AFTER BREAKFAST. 90 capsule 0   No facility-administered medications prior to visit.     ROS Review of Systems  Constitutional: Negative for activity change and appetite change.  HENT: Negative for sinus pressure and sore throat.   Eyes: Negative for visual disturbance.  Respiratory: Negative for cough, chest  tightness and shortness of breath.   Cardiovascular: Negative for chest pain and leg swelling.  Gastrointestinal: Negative for abdominal distention, abdominal pain, constipation and diarrhea.  Endocrine: Negative.   Genitourinary: Negative for dysuria.  Musculoskeletal: Negative for joint swelling and myalgias.  Skin: Negative for rash.  Allergic/Immunologic: Negative.   Neurological: Negative for weakness, light-headedness and numbness.  Psychiatric/Behavioral: Negative for dysphoric mood and suicidal ideas.    Objective:  BP 133/79   Pulse 90   Temp 99.1 F (37.3 C) (Oral)   Ht '5\' 6"'  (1.676 m)   Wt 196 lb (88.9 kg)   SpO2 99%   BMI 31.64 kg/m   BP/Weight 06/08/2020 8/67/6720 04/05/7095  Systolic BP 283 662 947  Diastolic BP 79 76 89  Wt. (Lbs) 196 192 188.6  BMI 31.64 30.99 30.44      Physical Exam Constitutional:      Appearance: He is well-developed.  Neck:     Vascular: No JVD.  Cardiovascular:     Rate and Rhythm: Normal rate.     Heart sounds: Normal heart sounds. No murmur heard.   Pulmonary:     Effort: Pulmonary effort is normal.     Breath sounds: Normal breath sounds. No wheezing or rales.  Chest:     Chest wall: No tenderness.  Abdominal:     General: Bowel sounds are normal. There is no distension.     Palpations: Abdomen is soft. There is no mass.  Tenderness: There is no abdominal tenderness.  Musculoskeletal:        General: Normal range of motion.     Right lower leg: No edema.     Left lower leg: No edema.  Neurological:     Mental Status: He is alert and oriented to person, place, and time.  Psychiatric:        Mood and Affect: Mood normal.     CMP Latest Ref Rng & Units 11/18/2019 04/10/2019 11/05/2018  Glucose 65 - 99 mg/dL 93 90 99  BUN 8 - 27 mg/dL '15 13 14  ' Creatinine 0.76 - 1.27 mg/dL 1.04 0.80 0.96  Sodium 134 - 144 mmol/L 138 137 139  Potassium 3.5 - 5.2 mmol/L 3.7 3.6 3.7  Chloride 96 - 106 mmol/L 100 95(L) 101  CO2 20 - 29  mmol/L '25 24 22  ' Calcium 8.6 - 10.2 mg/dL 9.3 9.5 9.1  Total Protein 6.0 - 8.5 g/dL - - 7.1  Total Bilirubin 0.0 - 1.2 mg/dL - - 1.0  Alkaline Phos 39 - 117 IU/L - - 71  AST 0 - 40 IU/L - - 21  ALT 0 - 44 IU/L - - 22    Lipid Panel     Component Value Date/Time   CHOL 140 11/05/2018 0848   TRIG 36 11/05/2018 0848   HDL 46 11/05/2018 0848   CHOLHDL 3.0 11/05/2018 0848   CHOLHDL 2.1 11/11/2015 0902   VLDL 7 11/11/2015 0902   LDLCALC 87 11/05/2018 0848    CBC    Component Value Date/Time   WBC 5.3 02/05/2018 0847   WBC 4.3 05/29/2013 1426   RBC 5.54 02/05/2018 0847   RBC 5.32 05/29/2013 1426   HGB 15.2 02/05/2018 0847   HCT 45.9 02/05/2018 0847   PLT 173 02/05/2018 0847   MCV 83 02/05/2018 0847   MCH 27.4 02/05/2018 0847   MCH 26.5 05/29/2013 1426   MCHC 33.1 02/05/2018 0847   MCHC 34.7 05/29/2013 1426   RDW 14.4 02/05/2018 0847   LYMPHSABS 1.7 02/05/2018 0847   MONOABS 0.6 01/27/2010 2219   EOSABS 0.1 02/05/2018 0847   BASOSABS 0.0 02/05/2018 0847    Lab Results  Component Value Date   HGBA1C 5.5 02/23/2015    Assessment & Plan:  1. Essential hypertension Controlled Counseled on blood pressure goal of less than 130/80, low-sodium, DASH diet, medication compliance, 150 minutes of moderate intensity exercise per week. Discussed medication compliance, adverse effects. - amLODipine (NORVASC) 10 MG tablet; Take 1 tablet (10 mg total) by mouth daily.  Dispense: 30 tablet; Refill: 6 - lisinopril-hydrochlorothiazide (ZESTORETIC) 20-25 MG tablet; Take 1 tablet by mouth daily.  Dispense: 30 tablet; Refill: 6 - CMP14+EGFR; Future - Lipid panel; Future  2. Benign prostatic hyperplasia without lower urinary tract symptoms Stable - tamsulosin (FLOMAX) 0.4 MG CAPS capsule; Take 1 capsule (0.4 mg total) by mouth daily after breakfast.  Dispense: 30 capsule; Refill: 6  3. Screening for colon cancer - Ambulatory referral to Gastroenterology  4. History of prostate  cancer Last PSA was 5.4 in  Repeat PSA today He is currently followed by urology - PSA, total and free; Future   Meds ordered this encounter  Medications  . amLODipine (NORVASC) 10 MG tablet    Sig: Take 1 tablet (10 mg total) by mouth daily.    Dispense:  30 tablet    Refill:  6  . lisinopril-hydrochlorothiazide (ZESTORETIC) 20-25 MG tablet    Sig: Take 1 tablet by mouth daily.  Dispense:  30 tablet    Refill:  6  . tamsulosin (FLOMAX) 0.4 MG CAPS capsule    Sig: Take 1 capsule (0.4 mg total) by mouth daily after breakfast.    Dispense:  30 capsule    Refill:  6    Follow-up: Return in about 6 months (around 12/06/2020) for chronic disease management.       Charlott Rakes, MD, FAAFP. [facility], [place]   06/08/2020, 3:06 PM

## 2020-06-08 NOTE — Progress Notes (Signed)
Needs referral to gastro for colonoscopy.

## 2020-06-08 NOTE — Patient Instructions (Signed)
Colonoscopy, Adult A colonoscopy is a procedure to look at the entire large intestine. This procedure is done using a long, thin, flexible tube that has a camera on the end. You may have a colonoscopy:  As a part of normal colorectal screening.  If you have certain symptoms, such as: ? A low number of red blood cells in your blood (anemia). ? Diarrhea that does not go away. ? Pain in your abdomen. ? Blood in your stool. A colonoscopy can help screen for and diagnose medical problems, including:  Tumors.  Extra tissue that grows where mucus forms (polyps).  Inflammation.  Areas of bleeding. Tell your health care provider about:  Any allergies you have.  All medicines you are taking, including vitamins, herbs, eye drops, creams, and over-the-counter medicines.  Any problems you or family members have had with anesthetic medicines.  Any blood disorders you have.  Any surgeries you have had.  Any medical conditions you have.  Any problems you have had with having bowel movements.  Whether you are pregnant or may be pregnant. What are the risks? Generally, this is a safe procedure. However, problems may occur, including:  Bleeding.  Damage to your intestine.  Allergic reactions to medicines given during the procedure.  Infection. This is rare. What happens before the procedure? Eating and drinking restrictions Follow instructions from your health care provider about eating or drinking restrictions, which may include:  A few days before the procedure: ? Follow a low-fiber diet. ? Avoid nuts, seeds, dried fruit, raw fruits, and vegetables.  1-3 days before the procedure: ? Eat only gelatin dessert or ice pops. ? Drink only clear liquids, such as water, clear juice, clear broth or bouillon, black coffee or tea, or clear soft drinks or sports drinks. ? Avoid liquids that contain red or purple dye.  The day of the procedure: ? Do not eat solid foods. You may  continue to drink clear liquids until up to 2 hours before the procedure. ? Do not eat or drink anything starting 2 hours before the procedure, or within the time period that your health care provider recommends. Bowel prep If you were prescribed a bowel prep to take by mouth (orally) to clean out your colon:  Take it as told by your health care provider. Starting the day before your procedure, you will need to drink a large amount of liquid medicine. The liquid will cause you to have many bowel movements of loose stool until your stool becomes almost clear or light green.  If your skin or the opening between the buttocks (anus) gets irritated from diarrhea, you may relieve the irritation using: ? Wipes with medicine in them, such as adult wet wipes with aloe and vitamin E. ? A product to soothe skin, such as petroleum jelly.  If you vomit while drinking the bowel prep: ? Take a break for up to 60 minutes. ? Begin the bowel prep again. ? Call your health care provider if you keep vomiting or you cannot take the bowel prep without vomiting.  To clean out your colon, you may also be given: ? Laxative medicines. These help you have a bowel movement. ? Instructions for enema use. An enema is liquid medicine injected into your rectum. Medicines Ask your health care provider about:  Changing or stopping your regular medicines or supplements. This is especially important if you are taking iron supplements, diabetes medicines, or blood thinners.  Taking medicines such as aspirin and ibuprofen. These medicines  can thin your blood. Do not take these medicines unless your health care provider tells you to take them.  Taking over-the-counter medicines, vitamins, herbs, and supplements. General instructions  Ask your health care provider what steps will be taken to help prevent infection. These may include washing skin with a germ-killing soap.  Plan to have someone take you home from the hospital  or clinic. What happens during the procedure?   An IV will be inserted into one of your veins.  You may be given one or more of the following: ? A medicine to help you relax (sedative). ? A medicine to numb the area (local anesthetic). ? A medicine to make you fall asleep (general anesthetic). This is rarely needed.  You will lie on your side with your knees bent.  The tube will: ? Have oil or gel put on it (be lubricated). ? Be inserted into your anus. ? Be gently eased through all parts of your large intestine.  Air will be sent into your colon to keep it open. This may cause some pressure or cramping.  Images will be taken with the camera and will appear on a screen.  A small tissue sample may be removed to be looked at under a microscope (biopsy). The tissue may be sent to a lab for testing if any signs of problems are found.  If small polyps are found, they may be removed and checked for cancer cells.  When the procedure is finished, the tube will be removed. The procedure may vary among health care providers and hospitals. What happens after the procedure?  Your blood pressure, heart rate, breathing rate, and blood oxygen level will be monitored until you leave the hospital or clinic.  You may have a small amount of blood in your stool.  You may pass gas and have mild cramping or bloating in your abdomen. This is caused by the air that was used to open your colon during the exam.  Do not drive for 24 hours after the procedure.  It is up to you to get the results of your procedure. Ask your health care provider, or the department that is doing the procedure, when your results will be ready. Summary  A colonoscopy is a procedure to look at the entire large intestine.  Follow instructions from your health care provider about eating and drinking before the procedure.  If you were prescribed an oral bowel prep to clean out your colon, take it as told by your health care  provider.  During the colonoscopy, a flexible tube with a camera on its end is inserted into the anus and then passed into the other parts of the large intestine. This information is not intended to replace advice given to you by your health care provider. Make sure you discuss any questions you have with your health care provider. Document Revised: 02/08/2019 Document Reviewed: 02/08/2019 Elsevier Patient Education  2020 [employer].  

## 2020-07-08 MED FILL — TAMSULOSIN HCL 0.4 MG CAP: 0.4 | 30 days supply | Qty: 30 | Fill #1

## 2020-07-08 MED FILL — LISINOPRIL-HYDROCHLOROTHIAZ: 20-25 | 30 days supply | Qty: 30 | Fill #1

## 2020-07-08 MED FILL — AMLODIPINE BESYLATE 10 MG T: 10 | 30 days supply | Qty: 30 | Fill #1

## 2020-08-07 MED FILL — TAMSULOSIN HCL 0.4 MG CAP: 0.4 | 30 days supply | Qty: 30 | Fill #2

## 2020-08-07 MED FILL — LISINOPRIL-HYDROCHLOROTHIAZ: 20-25 | 30 days supply | Qty: 30 | Fill #2

## 2020-08-07 MED FILL — AMLODIPINE BESYLATE 10 MG T: 10 | 30 days supply | Qty: 30 | Fill #2

## 2020-08-18 ENCOUNTER — Other Ambulatory Visit: Payer: Self-pay | Admitting: Family Medicine

## 2020-08-18 DIAGNOSIS — N529 Male erectile dysfunction, unspecified: Secondary | ICD-10-CM

## 2020-08-18 MED FILL — SILDENAFIL CITRATE 50 MG TA: 50 | 30 days supply | Qty: 10 | Fill #0

## 2020-09-07 ENCOUNTER — Other Ambulatory Visit: Payer: Self-pay | Admitting: Internal Medicine

## 2020-09-07 ENCOUNTER — Other Ambulatory Visit: Payer: Self-pay

## 2020-09-07 ENCOUNTER — Ambulatory Visit (AMBULATORY_SURGERY_CENTER): Payer: Self-pay

## 2020-09-07 VITALS — Ht 66.0 in | Wt 194.0 lb

## 2020-09-07 DIAGNOSIS — Z1211 Encounter for screening for malignant neoplasm of colon: Secondary | ICD-10-CM

## 2020-09-07 MED ORDER — NA SULFATE-K SULFATE-MG SULF 17.5-3.13-1.6 GM/177ML PO SOLN: 1.0000 | Freq: Once | ORAL | 0 refills | Status: DC

## 2020-09-07 MED FILL — SUPREP BOWEL PREP KIT: 17.5-3.13-1 | 30 days supply | Qty: 354 | Fill #0

## 2020-09-07 NOTE — Progress Notes (Signed)
No egg or soy allergy known to patient  No issues with past sedation with any surgeries or procedures No intubation problems in the past  No FH of Malignant Hyperthermia No diet pills per patient No home 02 use per patient  No blood thinners per patient  Pt denies issues with constipation  No A fib or A flutter  COVID 19 guidelines implemented in [facility] today with Pt and RN  Coupon given to pt in [facility] today , Code to Pharmacy and  NO PA's for preps discussed with pt in [facility] today  Due to the COVID-19 pandemic we are asking patients to follow certain guidelines.  Pt aware of COVID protocols and [facility] guidelines  COVID vaccines completed x 2 per pt;

## 2020-09-10 MED FILL — TAMSULOSIN HCL 0.4 MG CAP: 0.4 | 30 days supply | Qty: 30 | Fill #0

## 2020-09-10 MED FILL — LISINOPRIL-HYDROCHLOROTHIAZ: 20-25 | 30 days supply | Qty: 30 | Fill #0

## 2020-09-10 MED FILL — AMLODIPINE BESYLATE 10 MG T: 10 | 30 days supply | Qty: 30 | Fill #0

## 2020-09-21 ENCOUNTER — Encounter: Payer: Self-pay | Admitting: Internal Medicine

## 2020-09-21 ENCOUNTER — Other Ambulatory Visit: Payer: Self-pay

## 2020-09-21 ENCOUNTER — Ambulatory Visit (AMBULATORY_SURGERY_CENTER): Payer: Self-pay | Admitting: Internal Medicine

## 2020-09-21 VITALS — BP 127/73 | HR 67 | Temp 97.1°F | Resp 12 | Ht 66.0 in | Wt 194.0 lb

## 2020-09-21 DIAGNOSIS — Z1211 Encounter for screening for malignant neoplasm of colon: Secondary | ICD-10-CM

## 2020-09-21 MED ORDER — SODIUM CHLORIDE 0.9 % IV SOLN: 500.0000 mL | Freq: Once | INTRAVENOUS | Status: DC

## 2020-09-21 NOTE — Progress Notes (Signed)
Pt's states no medical or surgical changes since previsit or office visit.  VS SB

## 2020-09-21 NOTE — Progress Notes (Signed)
To PACU, VSS. Report to [email or web address] 

## 2020-09-21 NOTE — Patient Instructions (Signed)
Handout on hemorrhoids and diverticulosis provided.   YOU HAD AN ENDOSCOPIC PROCEDURE TODAY AT [facility]:   Refer to the procedure report that was given to you for any specific questions about what was found during the examination.  If the procedure report does not answer your questions, please call your gastroenterologist to clarify.  If you requested that your care partner not be given the details of your procedure findings, then the procedure report has been included in a sealed envelope for you to review at your convenience later.  YOU SHOULD EXPECT: Some feelings of bloating in the abdomen. Passage of more gas than usual.  Walking can help get rid of the air that was put into your GI tract during the procedure and reduce the bloating. If you had a lower endoscopy (such as a colonoscopy or flexible sigmoidoscopy) you may notice spotting of blood in your stool or on the toilet paper. If you underwent a bowel prep for your procedure, you may not have a normal bowel movement for a few days.  Please Note:  You might notice some irritation and congestion in your nose or some drainage.  This is from the oxygen used during your procedure.  There is no need for concern and it should clear up in a day or so.  SYMPTOMS TO REPORT IMMEDIATELY:   Following lower endoscopy (colonoscopy or flexible sigmoidoscopy):  Excessive amounts of blood in the stool  Significant tenderness or worsening of abdominal pains  Swelling of the abdomen that is new, acute  Fever of 100F or higher   For urgent or emergent issues, a gastroenterologist can be reached at any hour by calling [phone]. Do not use MyChart messaging for urgent concerns.    DIET:  We do recommend a small meal at first, but then you may proceed to your regular diet.  Drink plenty of fluids but you should avoid alcoholic beverages for 24 hours.  ACTIVITY:  You should plan to take it easy for the rest of today and you should  NOT DRIVE or use heavy machinery until tomorrow (because of the sedation medicines used during the test).    FOLLOW UP: Our staff will call the number listed on your records 48-72 hours following your procedure to check on you and address any questions or concerns that you may have regarding the information given to you following your procedure. If we do not reach you, we will leave a message.  We will attempt to reach you two times.  During this call, we will ask if you have developed any symptoms of COVID 19. If you develop any symptoms (ie: fever, flu-like symptoms, shortness of breath, cough etc.) before then, please call [phone].  If you test positive for Covid 19 in the 2 weeks post procedure, please call and report this information to [place].    If any biopsies were taken you will be contacted by phone or by letter within the next 1-3 weeks.  Please call us at [phone] if you have not heard about the biopsies in 3 weeks.    SIGNATURES/CONFIDENTIALITY: You and/or your care partner have signed paperwork which will be entered into your electronic medical record.  These signatures attest to the fact that that the information above on your After Visit Summary has been reviewed and is understood.  Full responsibility of the confidentiality of this discharge information lies with you and/or your care-partner.

## 2020-09-21 NOTE — Op Note (Signed)
[place] Patient Name: Jeffery Graves Procedure Date: 09/21/2020 10:53 AM MRN: [id number] Endoscopist: Docia Chuck. Henrene Pastor , MD Age: 65 Referring MD:  Date of Birth: 05/09/1956 Gender: Male Account #: [id number] Procedure:                Colonoscopy Indications:              Screening for colorectal malignant neoplasm Medicines:                Monitored Anesthesia Care Procedure:                Pre-Anesthesia Assessment:                           - Prior to the procedure, a History and Physical                            was performed, and patient medications and                            allergies were reviewed. The patient's tolerance of                            previous anesthesia was also reviewed. The risks                            and benefits of the procedure and the sedation                            options and risks were discussed with the patient.                            All questions were answered, and informed consent                            was obtained. Prior Anticoagulants: The patient has                            taken no previous anticoagulant or antiplatelet                            agents. ASA Grade Assessment: II - A patient with                            mild systemic disease. After reviewing the risks                            and benefits, the patient was deemed in                            satisfactory condition to undergo the procedure.                           After obtaining informed consent, the colonoscope  was passed under direct vision. Throughout the                            procedure, the patient's blood pressure, pulse, and                            oxygen saturations were monitored continuously. The                            Olympus CF-HQ190L ([id number]) Colonoscope was                            introduced through the anus and advanced to the the                            cecum, identified by  appendiceal orifice and                            ileocecal valve. The ileocecal valve, appendiceal                            orifice, and rectum were photographed. The quality                            of the bowel preparation was excellent. The                            colonoscopy was performed without difficulty. The                            patient tolerated the procedure well. The bowel                            preparation used was SUPREP via split dose                            instruction. Scope In: 10:59:35 AM Scope Out: 11:12:53 AM Scope Withdrawal Time: 0 hours 10 minutes 29 seconds  Total Procedure Duration: 0 hours 13 minutes 18 seconds  Findings:                 Many small and large-mouthed diverticula were found                            in the entire colon.                           Internal hemorrhoids were found during retroflexion.                           The exam was otherwise without abnormality on                            direct and retroflexion views. Complications:            No immediate complications.  Estimated blood loss:                            None. Estimated Blood Loss:     Estimated blood loss: none. Impression:               - Diverticulosis in the entire examined colon.                           - Internal hemorrhoids.                           - The examination was otherwise normal on direct                            and retroflexion views.                           - No specimens collected. Recommendation:           - Repeat colonoscopy in 10 years for screening                            purposes.                           - Patient has a contact number available for                            emergencies. The signs and symptoms of potential                            delayed complications were discussed with the                            patient. Return to normal activities tomorrow.                            Written discharge  instructions were provided to the                            patient.                           - Resume previous diet.                           - Continue present medications. Docia Chuck. Henrene Pastor, MD 09/21/2020 11:15:44 AM This report has been signed electronically.

## 2020-09-23 ENCOUNTER — Telehealth: Payer: Self-pay | Admitting: *Deleted

## 2020-09-23 NOTE — Telephone Encounter (Signed)
Patient returned your follow-up call after his procedure. He stated to be feeling fine.  

## 2020-09-23 NOTE — Telephone Encounter (Signed)
  Follow up Call-  Call back number 09/21/2020  Post procedure Call Back phone  # [phone]  Permission to leave phone message Yes  Some recent data might be hidden     Patient questions:  Patient hung up.

## 2020-09-23 NOTE — Telephone Encounter (Signed)
  Follow up Call-  Call back number 09/21/2020  Post procedure Call Back phone  # [phone]  Permission to leave phone message Yes  Some recent data might be hidden     Patient questions:  Do you have a fever, pain , or abdominal swelling? No. Pain Score  0 *  Have you tolerated food without any problems? Yes.    Have you been able to return to your normal activities? Yes.    Do you have any questions about your discharge instructions: Diet   No. Medications  No. Follow up visit  No.  Do you have questions or concerns about your Care? No.  Actions: * If pain score is 4 or above: No action needed, pain <4.  1. Have you developed a fever since your procedure? no  2.   Have you had an respiratory symptoms (SOB or cough) since your procedure? no  3.   Have you tested positive for COVID 19 since your procedure no  4.   Have you had any family members/close contacts diagnosed with the COVID 19 since your procedure?  no   If yes to any of these questions please route to Joylene John, RN and Joella Prince, RN

## 2020-09-24 ENCOUNTER — Telehealth: Payer: Self-pay | Admitting: Family Medicine

## 2020-09-24 ENCOUNTER — Ambulatory Visit: Payer: Self-pay | Attending: Family Medicine

## 2020-09-24 ENCOUNTER — Other Ambulatory Visit: Payer: Self-pay

## 2020-09-24 DIAGNOSIS — I10 Essential (primary) hypertension: Secondary | ICD-10-CM

## 2020-09-24 DIAGNOSIS — Z8546 Personal history of malignant neoplasm of prostate: Secondary | ICD-10-CM

## 2020-09-24 NOTE — Telephone Encounter (Signed)
Pt needs new referral to urology.

## 2020-09-24 NOTE — Telephone Encounter (Signed)
Patient came in for lab work today and requested a referral for Urology. Patient requested referral to be sent to [facility] and states he has the orange card. Please follow up.

## 2020-09-25 ENCOUNTER — Telehealth: Payer: Self-pay

## 2020-09-25 LAB — CMP14+EGFR
ALT: 22 IU/L (ref 0–44)
AST: 21 IU/L (ref 0–40)
Albumin/Globulin Ratio: 1.6 (ref 1.2–2.2)
Albumin: 4.4 g/dL (ref 3.8–4.8)
Alkaline Phosphatase: 68 IU/L (ref 44–121)
BUN/Creatinine Ratio: 7 — ABNORMAL LOW (ref 10–24)
BUN: 8 mg/dL (ref 8–27)
Bilirubin Total: 0.8 mg/dL (ref 0.0–1.2)
CO2: 25 mmol/L (ref 20–29)
Calcium: 9.6 mg/dL (ref 8.6–10.2)
Chloride: 98 mmol/L (ref 96–106)
Creatinine, Ser: 1.09 mg/dL (ref 0.76–1.27)
GFR calc Af Amer: 84 mL/min/{1.73_m2} (ref >59)
GFR calc non Af Amer: 72 mL/min/{1.73_m2} (ref >59)
Globulin, Total: 2.8 g/dL (ref 1.5–4.5)
Glucose: 93 mg/dL (ref 65–99)
Potassium: 3.5 mmol/L (ref 3.5–5.2)
Sodium: 138 mmol/L (ref 134–144)
Total Protein: 7.2 g/dL (ref 6.0–8.5)

## 2020-09-25 LAB — PSA, TOTAL AND FREE
PSA, Free Pct: 4.8 %
PSA, Free: 0.29 ng/mL
Prostate Specific Ag, Serum: 6 ng/mL — ABNORMAL HIGH (ref 0.0–4.0)

## 2020-09-25 NOTE — Telephone Encounter (Signed)
Patient name and DOB has been verified Patient was informed of lab results. Patient had no questions.  

## 2020-09-25 NOTE — Telephone Encounter (Signed)
Pt was called and informed of referral being placed. ?

## 2020-09-25 NOTE — Telephone Encounter (Signed)
-----   Message from Charlott Rakes, MD sent at 09/25/2020  1:55 PM EST ----- PSA is elevated compared to last result. I will need him to keep is appointment with Urology. All other labs are normal.

## 2020-10-08 MED FILL — TAMSULOSIN HCL 0.4 MG CAP: 0.4 | 30 days supply | Qty: 30 | Fill #1

## 2020-10-08 MED FILL — AMLODIPINE BESYLATE 10 MG T: 10 | 30 days supply | Qty: 30 | Fill #1

## 2020-10-08 MED FILL — LISINOPRIL-HYDROCHLOROTHIAZ: 20-25 | 30 days supply | Qty: 30 | Fill #1

## 2020-10-16 ENCOUNTER — Ambulatory Visit: Payer: Self-pay | Attending: Family Medicine

## 2020-10-16 ENCOUNTER — Other Ambulatory Visit: Payer: Self-pay

## 2020-10-16 DIAGNOSIS — N400 Enlarged prostate without lower urinary tract symptoms: Secondary | ICD-10-CM

## 2020-10-16 DIAGNOSIS — I10 Essential (primary) hypertension: Secondary | ICD-10-CM

## 2020-10-17 LAB — PSA, TOTAL AND FREE
PSA, Free Pct: 4.4 %
PSA, Free: 0.28 ng/mL
Prostate Specific Ag, Serum: 6.3 ng/mL — ABNORMAL HIGH (ref 0.0–4.0)

## 2020-10-19 MED FILL — SILDENAFIL CITRATE 50 MG TA: 50 | 30 days supply | Qty: 10 | Fill #1

## 2020-10-22 ENCOUNTER — Telehealth: Payer: Self-pay

## 2020-10-22 NOTE — Telephone Encounter (Signed)
Pt was called and informed to contact office for lab results. Results has been faxed to [facility].

## 2020-10-22 NOTE — Telephone Encounter (Signed)
-----   Message from Charlott Rakes, MD sent at 10/21/2020  5:09 PM EDT ----- PSA is elevated at 6.3.  Please fax results to [facility] and also informed the patient.  Thank you

## 2020-10-31 ENCOUNTER — Other Ambulatory Visit: Payer: Self-pay

## 2020-11-06 ENCOUNTER — Other Ambulatory Visit: Payer: Self-pay

## 2020-11-06 MED FILL — Amlodipine Besylate Tab 10 MG (Base Equivalent): ORAL | 30 days supply | Qty: 30 | Fill #0 | Status: AC

## 2020-11-06 MED FILL — Lisinopril & Hydrochlorothiazide Tab 20-25 MG: ORAL | 30 days supply | Qty: 30 | Fill #0 | Status: AC

## 2020-11-06 MED FILL — Tamsulosin HCl Cap 0.4 MG: ORAL | 30 days supply | Qty: 30 | Fill #0 | Status: AC

## 2020-11-09 ENCOUNTER — Other Ambulatory Visit: Payer: Self-pay

## 2020-11-17 ENCOUNTER — Ambulatory Visit: Payer: Self-pay

## 2020-11-23 ENCOUNTER — Other Ambulatory Visit: Payer: Self-pay

## 2020-11-23 ENCOUNTER — Ambulatory Visit: Payer: Self-pay | Attending: Family Medicine

## 2020-11-24 ENCOUNTER — Telehealth: Payer: Self-pay

## 2020-11-24 DIAGNOSIS — R9721 Rising PSA following treatment for malignant neoplasm of prostate: Secondary | ICD-10-CM

## 2020-11-24 NOTE — Telephone Encounter (Signed)
Copied from [place] [id number]. Topic: Appointment Scheduling - Scheduling Inquiry for Clinic >> Nov 17, 2020  4:32 PM Oneta Rack wrote: Osvaldo Human name: Sharee Pimple  Call back number: [phone] fax [phone]     Dr. Remi Haggard, MD office requesting assistance with scheduling a bone density and outside CT scan,

## 2020-11-24 NOTE — Telephone Encounter (Signed)
requesting CT of Abdomen and pelvis with contrast and bone density scan. DX R97.21  I will inform patient once ordered.

## 2020-11-24 NOTE — Telephone Encounter (Signed)
I will need the indication for both tests and diagnosis.

## 2020-11-24 NOTE — Telephone Encounter (Signed)
Call placed to [facility] and a VM was left informing her to return phone call with information regarding CT scan.

## 2020-11-27 ENCOUNTER — Other Ambulatory Visit: Payer: Self-pay | Admitting: Family Medicine

## 2020-11-27 DIAGNOSIS — R9721 Rising PSA following treatment for malignant neoplasm of prostate: Secondary | ICD-10-CM

## 2020-11-27 NOTE — Addendum Note (Signed)
Addended by: Charlott Rakes on: 11/27/2020 11:33 AM   Modules accepted: Orders

## 2020-11-27 NOTE — Telephone Encounter (Signed)
Done

## 2020-11-27 NOTE — Telephone Encounter (Signed)
Ordered

## 2020-11-27 NOTE — Telephone Encounter (Signed)
Orders need to changed to your name.

## 2020-11-27 NOTE — Telephone Encounter (Signed)
The DX code is R97.21 his PSA levels were elevated.

## 2020-11-27 NOTE — Telephone Encounter (Signed)
CT has been scheduled and patient has been left a VM informing hi to call and get appointment details.

## 2020-12-04 ENCOUNTER — Ambulatory Visit (HOSPITAL_COMMUNITY): Payer: Self-pay

## 2020-12-08 ENCOUNTER — Ambulatory Visit (HOSPITAL_COMMUNITY)
Admission: RE | Admit: 2020-12-08 | Discharge: 2020-12-08 | Disposition: A | Discharge status: Home / Self Care | Payer: Self-pay | Source: Ambulatory Visit | Attending: Family Medicine | Admitting: Family Medicine

## 2020-12-08 ENCOUNTER — Encounter: Payer: Self-pay | Admitting: Family Medicine

## 2020-12-08 ENCOUNTER — Ambulatory Visit: Payer: Self-pay | Attending: Family Medicine | Admitting: Family Medicine

## 2020-12-08 ENCOUNTER — Other Ambulatory Visit: Payer: Self-pay

## 2020-12-08 ENCOUNTER — Encounter (HOSPITAL_COMMUNITY): Payer: Self-pay

## 2020-12-08 VITALS — BP 131/74 | HR 77 | Ht 66.0 in | Wt 192.4 lb

## 2020-12-08 DIAGNOSIS — R9721 Rising PSA following treatment for malignant neoplasm of prostate: Secondary | ICD-10-CM | POA: Insufficient documentation

## 2020-12-08 DIAGNOSIS — I10 Essential (primary) hypertension: Secondary | ICD-10-CM

## 2020-12-08 DIAGNOSIS — N400 Enlarged prostate without lower urinary tract symptoms: Secondary | ICD-10-CM

## 2020-12-08 LAB — POCT I-STAT CREATININE: Creatinine, Ser: 0.9 mg/dL (ref 0.61–1.24)

## 2020-12-08 MED ORDER — SODIUM CHLORIDE (PF) 0.9 % IJ SOLN
INTRAMUSCULAR | Status: AC
  Filled 2020-12-08: qty 50

## 2020-12-08 MED ORDER — IOHEXOL 300 MG/ML  SOLN
100.0000 mL | Freq: Once | INTRAMUSCULAR | Status: AC | PRN
  Administered 2020-12-08: 100 mL via INTRAVENOUS

## 2020-12-08 MED FILL — Lisinopril & Hydrochlorothiazide Tab 20-25 MG: ORAL | 30 days supply | Qty: 30 | Fill #1 | Status: AC

## 2020-12-08 MED FILL — Tamsulosin HCl Cap 0.4 MG: ORAL | 30 days supply | Qty: 30 | Fill #1 | Status: AC

## 2020-12-08 MED FILL — Amlodipine Besylate Tab 10 MG (Base Equivalent): ORAL | 30 days supply | Qty: 30 | Fill #1 | Status: AC

## 2020-12-08 NOTE — Patient Instructions (Signed)
The Journal of Orthopaedic and Sports Physical Therapy, 44(10), 748. https://doi.org/10.2519/jospt.2014.0506">  How to Increase Your Level of Physical Activity Getting regular physical activity is important for your overall health and well-being. Most people do not get enough exercise. There are easy ways to increase your level of physical activity, even if you have not been very active in the past or if you are just starting out. How can increasing my physical activity affect me? Physical activity has many short-term and long-term benefits. Being active on a regular basis can improve your physical and mental health as well as provide other benefits. Physical health benefits  Helping you lose weight or maintain a healthy weight.  Strengthening your muscles and bones.  Reducing your risk of certain long-term (chronic) diseases, including heart disease, cancer, and diabetes.  Being able to move around more easily and for longer periods of time without getting tired (increased stamina).  Improving your ability to fight off illness (enhanced immunity).  Being able to sleep better.  Helping you stay healthy as you get older, including: ? Helping you stay mobile, or capable of walking and moving around. ? Preventing accidents, such as falls. ? Increasing life expectancy. Mental health benefits  Boosting your mood and improving your self-esteem.  Lowering your chance of having mental health problems, such as depression or anxiety.  Helping you feel good about your body. Other benefits  Finding new sources of fun and enjoyment.  Meeting new people who share a common interest. What steps can I take to be more physically active? Getting started  If you have a chronic illness or have not been active for a while, check with your health care provider about how to get started. Ask your health care provider what activities are safe for you.  Start out slowly. Walking or doing some simple  chair exercises is a good place to start, especially if you have not been active before or for a long time.  Set goals that you can work toward. Ask your health care provider how much exercise is best for you. In general, most adults should: ? Do moderate-intensity exercise for at least 150 minutes each week (30 minutes on most days of the week) or vigorous exercise for at least 75 minutes each week, or a combination of these.  Moderate-intensity exercise can include walking at a quick pace, biking, yoga, water aerobics, or gardening.  Vigorous exercise involves activities that take more effort, such as jogging or running, playing sports, swimming laps, or jumping rope. ? Do strength exercises on at least 2 days each week. This can include weight lifting, body weight exercises, and resistance-band exercises.  Consider using a fitness tracker, such as a mobile phone app or a device worn like a watch, that will count the number of steps you take each day. Many people strive to reach 10,000 steps a day. Choosing activities  Try to find activities that you enjoy. You are more likely to commit to an exercise routine if it does not feel like a chore.  If you have bone or joint problems, choose low-impact exercises, like walking or swimming.  Use these tips for being successful with an exercise plan: ? Find a workout partner for accountability. ? Join a group or class, such as an aerobics class, cycling class, or sports team. ? Make family time active. Go for a walk, bike, or swim. ? Include a variety of exercises each week. Being active in your daily routines Besides your formal exercise   plans, you can find ways to do physical activity during your daily routines, such as:  Walking or biking to work or to the store.  Taking the stairs instead of the elevator.  Parking farther away from the door at work or at the store.  Planning walking meetings.  Walking around while you are on the phone.    Where to find more information  Centers for Disease Control and Prevention: www.cdc.gov/physicalactivity  President's Council on Fitness, Sports & Nutrition: www.fitness.gov  ChooseMyPlate: www.choosemyplate.gov Contact a health care provider if:  You have headaches, muscle aches, or joint pain.  You feel dizzy or light-headed while exercising.  You faint.  You have chest pain while exercising. Summary  Exercise benefits your mind and body at any age, even if you are just starting out.  If you have a chronic illness or have not been active for a while, check with your health care provider before increasing your physical activity.  Choose activities that are safe and enjoyable for you. Ask your health care provider what activities are safe for you.  Start slowly. Tell your health care provider if you have problems as you start to increase your activity level. This information is not intended to replace advice given to you by your health care provider. Make sure you discuss any questions you have with your health care provider. Document Revised: 04/22/2019 Document Reviewed: 02/11/2019 Elsevier Patient Education  2021 Elsevier Inc.  

## 2020-12-08 NOTE — Progress Notes (Signed)
Subjective:  Patient ID: Jeffery Graves, male    DOB: 1955-09-21  Age: 65 y.o. MRN: [id number]  CC: Hypertension   HPI Jeffery Graves is a 65year old male with a history of Hypertension, history of Adenocarcinoma of the prostate (s/p treatment), Benign prostatic hypertrophy, allergic rhinitis who comes into the clinic for a follow up visit  He does work out and lift weights 5 days a week and is compliant with his antihypertensive. His PSA has been elevated of recent with last PSA at 6.3 I had received a message from his Urologist requesting an order for Ct abdomen and Pelvis (which he had today) and a bone density scan which he is yet to undergo.  He has no complaints today. Past Medical History:  Diagnosis Date  . BPH (benign prostatic hyperplasia)   . Hx of radiation therapy 09/06/10 - 10/29/10   prostate, seminal vesicles  . Hypertension    on meds  . Prostate cancer ([place]) 04/26/2010   gleason 7, vol 20.88 cc    Past Surgical History:  Procedure Laterality Date  . BIOPSY PROSTATE      Family History  Problem Relation Age of Onset  . Stroke Mother   . Colon cancer Neg Hx   . Colon polyps Neg Hx   . Esophageal cancer Neg Hx   . Stomach cancer Neg Hx   . Rectal cancer Neg Hx     No Known Allergies  Outpatient Medications Prior to Visit  Medication Sig Dispense Refill  . amLODipine (NORVASC) 10 MG tablet TAKE 1 TABLET (10 MG TOTAL) BY MOUTH DAILY. 30 tablet 6  . lisinopril-hydrochlorothiazide (ZESTORETIC) 20-25 MG tablet TAKE 1 TABLET BY MOUTH DAILY. 30 tablet 6  . sildenafil (VIAGRA) 50 MG tablet TAKE 1 TABLET BY MOUTH ONCE DAILY AS NEEDED FOR ERECTILE DYSFUNCTION. AT LEAST 24 HOURS BETWEEN DOSES 10 tablet 2  . tamsulosin (FLOMAX) 0.4 MG CAPS capsule TAKE 1 CAPSULE (0.4 MG TOTAL) BY MOUTH DAILY AFTER BREAKFAST. 30 capsule 6   Facility-Administered Medications Prior to Visit  Medication Dose Route Frequency Provider Last Rate Last Admin  . sodium chloride (PF) 0.9 %  injection              ROS Review of Systems  Constitutional: Negative for activity change and appetite change.  HENT: Negative for sinus pressure and sore throat.   Eyes: Negative for visual disturbance.  Respiratory: Negative for cough, chest tightness and shortness of breath.   Cardiovascular: Negative for chest pain and leg swelling.  Gastrointestinal: Negative for abdominal distention, abdominal pain, constipation and diarrhea.  Endocrine: Negative.   Genitourinary: Negative for dysuria.  Musculoskeletal: Negative for joint swelling and myalgias.  Skin: Negative for rash.  Allergic/Immunologic: Negative.   Neurological: Negative for weakness, light-headedness and numbness.  Psychiatric/Behavioral: Negative for dysphoric mood and suicidal ideas.    Objective:  BP 131/74   Pulse 77   Ht 5\' 6"  (1.676 m)   Wt 192 lb 6.4 oz (87.3 kg)   SpO2 100%   BMI 31.05 kg/m   BP/Weight 12/08/2020 02/01/5008 10/07/1827  Systolic BP 937 169 -  Diastolic BP 74 73 -  Wt. (Lbs) 192.4 194 194  BMI 31.05 31.31 31.31      Physical Exam Constitutional:      Appearance: He is well-developed.  Neck:     Vascular: No JVD.  Cardiovascular:     Rate and Rhythm: Normal rate.     Heart sounds: Normal heart sounds.  No murmur heard.   Pulmonary:     Effort: Pulmonary effort is normal.     Breath sounds: Normal breath sounds. No wheezing or rales.  Chest:     Chest wall: No tenderness.  Abdominal:     General: Bowel sounds are normal. There is no distension.     Palpations: Abdomen is soft. There is no mass.     Tenderness: There is no abdominal tenderness.  Musculoskeletal:        General: Normal range of motion.     Right lower leg: No edema.     Left lower leg: No edema.  Neurological:     Mental Status: He is alert and oriented to person, place, and time.  Psychiatric:        Mood and Affect: Mood normal.     CMP Latest Ref Rng & Units 12/08/2020 09/24/2020 11/18/2019  Glucose 65  - 99 mg/dL - 93 93  BUN 8 - 27 mg/dL - 8 15  Creatinine 0.61 - 1.24 mg/dL 0.90 1.09 1.04  Sodium 134 - 144 mmol/L - 138 138  Potassium 3.5 - 5.2 mmol/L - 3.5 3.7  Chloride 96 - 106 mmol/L - 98 100  CO2 20 - 29 mmol/L - 25 25  Calcium 8.6 - 10.2 mg/dL - 9.6 9.3  Total Protein 6.0 - 8.5 g/dL - 7.2 -  Total Bilirubin 0.0 - 1.2 mg/dL - 0.8 -  Alkaline Phos 44 - 121 IU/L - 68 -  AST 0 - 40 IU/L - 21 -  ALT 0 - 44 IU/L - 22 -    Lipid Panel     Component Value Date/Time   CHOL 140 11/05/2018 0848   TRIG 36 11/05/2018 0848   HDL 46 11/05/2018 0848   CHOLHDL 3.0 11/05/2018 0848   CHOLHDL 2.1 11/11/2015 0902   VLDL 7 11/11/2015 0902   LDLCALC 87 11/05/2018 0848    CBC    Component Value Date/Time   WBC 5.3 02/05/2018 0847   WBC 4.3 05/29/2013 1426   RBC 5.54 02/05/2018 0847   RBC 5.32 05/29/2013 1426   HGB 15.2 02/05/2018 0847   HCT 45.9 02/05/2018 0847   PLT 173 02/05/2018 0847   MCV 83 02/05/2018 0847   MCH 27.4 02/05/2018 0847   MCH 26.5 05/29/2013 1426   MCHC 33.1 02/05/2018 0847   MCHC 34.7 05/29/2013 1426   RDW 14.4 02/05/2018 0847   LYMPHSABS 1.7 02/05/2018 0847   MONOABS 0.6 01/27/2010 2219   EOSABS 0.1 02/05/2018 0847   BASOSABS 0.0 02/05/2018 0847    Lab Results  Component Value Date   HGBA1C 5.5 02/23/2015    Assessment & Plan:  1. Essential hypertension Controlled Continue Lisinopril/HCTZ, Amlodipine Counseled on blood pressure goal of less than 130/80, low-sodium, DASH diet, medication compliance, 150 minutes of moderate intensity exercise per week. Discussed medication compliance, adverse effects.  2. Increasing PSA level after treatment for prostate cancer CT abdomen and Pelvis result pending Yet to undergo Bone Density Followed by Urology  3. Benign prostatic hyperplasia without lower urinary tract symptoms Continue Flomax    No orders of the defined types were placed in this encounter.   Follow-up: Return in about 6 months (around  06/10/2021) for Chronic disease management.       Charlott Rakes, MD, FAAFP. [facility] [place], [place]   12/08/2020, 11:53 AM

## 2020-12-10 ENCOUNTER — Telehealth: Payer: Self-pay

## 2020-12-10 NOTE — Telephone Encounter (Signed)
-----   Message from Charlott Rakes, MD sent at 12/09/2020  5:06 PM EDT ----- Please inform him that his CT scan does not reveal abdominal or pelvic lesion suspicious for prostate cancer.  He does have cholesterol plaques in his arteries and low-cholesterol diet will be beneficial.  Also has diverticulosis but no evidence of infection.  Please advise him to follow-up with his urologist regarding this.

## 2020-12-10 NOTE — Telephone Encounter (Signed)
Patient name and DOB has been verified Patient was informed of lab results. Patient had no questions.  

## 2021-01-07 ENCOUNTER — Other Ambulatory Visit: Payer: Self-pay

## 2021-01-07 MED FILL — Lisinopril & Hydrochlorothiazide Tab 20-25 MG: ORAL | 30 days supply | Qty: 30 | Fill #2 | Status: AC

## 2021-01-07 MED FILL — Tamsulosin HCl Cap 0.4 MG: ORAL | 30 days supply | Qty: 30 | Fill #2 | Status: AC

## 2021-01-07 MED FILL — Amlodipine Besylate Tab 10 MG (Base Equivalent): ORAL | 30 days supply | Qty: 30 | Fill #2 | Status: AC

## 2021-01-11 ENCOUNTER — Other Ambulatory Visit: Payer: Self-pay

## 2021-02-09 ENCOUNTER — Other Ambulatory Visit: Payer: Self-pay

## 2021-02-09 MED FILL — Lisinopril & Hydrochlorothiazide Tab 20-25 MG: ORAL | 30 days supply | Qty: 30 | Fill #3 | Status: AC

## 2021-02-09 MED FILL — Tamsulosin HCl Cap 0.4 MG: ORAL | 30 days supply | Qty: 30 | Fill #3 | Status: AC

## 2021-02-09 MED FILL — Amlodipine Besylate Tab 10 MG (Base Equivalent): ORAL | 30 days supply | Qty: 30 | Fill #3 | Status: AC

## 2021-02-15 ENCOUNTER — Other Ambulatory Visit: Payer: Self-pay

## 2021-02-15 MED FILL — Sildenafil Citrate Tab 50 MG: ORAL | 30 days supply | Qty: 10 | Fill #0 | Status: AC

## 2021-02-17 ENCOUNTER — Other Ambulatory Visit: Payer: Self-pay

## 2021-03-10 ENCOUNTER — Other Ambulatory Visit: Payer: Self-pay

## 2021-03-10 MED FILL — Lisinopril & Hydrochlorothiazide Tab 20-25 MG: ORAL | 30 days supply | Qty: 30 | Fill #4 | Status: AC

## 2021-03-10 MED FILL — Tamsulosin HCl Cap 0.4 MG: ORAL | 30 days supply | Qty: 30 | Fill #4 | Status: AC

## 2021-03-10 MED FILL — Amlodipine Besylate Tab 10 MG (Base Equivalent): ORAL | 30 days supply | Qty: 30 | Fill #4 | Status: AC

## 2021-03-12 ENCOUNTER — Other Ambulatory Visit: Payer: Self-pay

## 2021-04-13 ENCOUNTER — Other Ambulatory Visit: Payer: Self-pay

## 2021-04-13 ENCOUNTER — Other Ambulatory Visit: Payer: Self-pay | Admitting: Family Medicine

## 2021-04-13 DIAGNOSIS — I10 Essential (primary) hypertension: Secondary | ICD-10-CM

## 2021-04-13 DIAGNOSIS — N400 Enlarged prostate without lower urinary tract symptoms: Secondary | ICD-10-CM

## 2021-04-13 MED ORDER — LISINOPRIL-HYDROCHLOROTHIAZIDE 20-25 MG PO TABS
1.0000 | ORAL_TABLET | Freq: Every day | ORAL | 6 refills | Status: DC
  Filled 2021-04-13: qty 30, 30d supply, fill #0
  Filled 2021-05-13: qty 30, 30d supply, fill #1
  Filled 2021-06-18: qty 30, 30d supply, fill #2
  Filled 2021-07-15: qty 30, 30d supply, fill #3
  Filled 2021-08-17: qty 30, 30d supply, fill #0
  Filled 2021-09-15: qty 30, 30d supply, fill #1
  Filled 2021-10-14: qty 30, 30d supply, fill #2

## 2021-04-13 MED ORDER — AMLODIPINE BESYLATE 10 MG PO TABS
ORAL_TABLET | Freq: Every day | ORAL | 6 refills | Status: DC
  Filled 2021-04-13: qty 30, 30d supply, fill #0
  Filled 2021-05-13: qty 30, 30d supply, fill #1
  Filled 2021-06-18: qty 90, 90d supply, fill #2
  Filled 2021-09-15: qty 30, 30d supply, fill #0
  Filled 2021-10-14: qty 30, 30d supply, fill #1

## 2021-04-13 MED ORDER — TAMSULOSIN HCL 0.4 MG PO CAPS
ORAL_CAPSULE | ORAL | 6 refills | Status: DC
  Filled 2021-04-13: qty 30, 30d supply, fill #0
  Filled 2021-05-13 – 2021-05-14 (×3): qty 30, 30d supply, fill #1
  Filled 2021-06-18: qty 30, 30d supply, fill #2
  Filled 2021-07-15: qty 30, 30d supply, fill #3
  Filled 2021-08-17: qty 30, 30d supply, fill #0
  Filled 2021-09-15: qty 30, 30d supply, fill #1
  Filled 2021-10-14: qty 30, 30d supply, fill #2

## 2021-04-15 ENCOUNTER — Other Ambulatory Visit: Payer: Self-pay

## 2021-05-13 ENCOUNTER — Other Ambulatory Visit: Payer: Self-pay

## 2021-05-14 ENCOUNTER — Other Ambulatory Visit: Payer: Self-pay

## 2021-05-26 ENCOUNTER — Ambulatory Visit: Payer: Self-pay | Attending: Family Medicine

## 2021-05-26 ENCOUNTER — Other Ambulatory Visit: Payer: Self-pay

## 2021-06-07 ENCOUNTER — Ambulatory Visit: Payer: Self-pay | Admitting: Family Medicine

## 2021-06-18 ENCOUNTER — Other Ambulatory Visit: Payer: Self-pay

## 2021-06-22 ENCOUNTER — Encounter: Payer: Self-pay | Admitting: Family Medicine

## 2021-06-22 ENCOUNTER — Other Ambulatory Visit: Payer: Self-pay

## 2021-06-22 ENCOUNTER — Ambulatory Visit: Payer: Self-pay | Attending: Family Medicine | Admitting: Family Medicine

## 2021-06-22 ENCOUNTER — Other Ambulatory Visit: Payer: Self-pay | Admitting: Family Medicine

## 2021-06-22 VITALS — BP 117/76 | HR 85 | Ht 66.0 in | Wt 190.6 lb

## 2021-06-22 DIAGNOSIS — N529 Male erectile dysfunction, unspecified: Secondary | ICD-10-CM

## 2021-06-22 DIAGNOSIS — I10 Essential (primary) hypertension: Secondary | ICD-10-CM

## 2021-06-22 DIAGNOSIS — N400 Enlarged prostate without lower urinary tract symptoms: Secondary | ICD-10-CM

## 2021-06-22 DIAGNOSIS — R9721 Rising PSA following treatment for malignant neoplasm of prostate: Secondary | ICD-10-CM

## 2021-06-22 MED ORDER — SILDENAFIL CITRATE 50 MG PO TABS
ORAL_TABLET | ORAL | 2 refills | Status: DC
Start: 2021-06-22 — End: 2023-03-15
  Filled 2021-06-22: qty 10, 30d supply, fill #0

## 2021-06-22 NOTE — Patient Instructions (Signed)
Exercising to Stay Healthy °To become healthy and stay healthy, it is recommended that you do moderate-intensity and vigorous-intensity exercise. You can tell that you are exercising at a moderate intensity if your heart starts beating faster and you start breathing faster but can still hold a conversation. You can tell that you are exercising at a vigorous intensity if you are breathing much harder and faster and cannot hold a conversation while exercising. °How can exercise benefit me? °Exercising regularly is important. It has many health benefits, such as: °Improving overall fitness, flexibility, and endurance. °Increasing bone density. °Helping with weight control. °Decreasing body fat. °Increasing muscle strength and endurance. °Reducing stress and tension, anxiety, depression, or anger. °Improving overall health. °What guidelines should I follow while exercising? °Before you start a new exercise program, talk with your health care provider. °Do not exercise so much that you hurt yourself, feel dizzy, or get very short of breath. °Wear comfortable clothes and wear shoes with good support. °Drink plenty of water while you exercise to prevent dehydration or heat stroke. °Work out until your breathing and your heartbeat get faster (moderate intensity). °How often should I exercise? °Choose an activity that you enjoy, and set realistic goals. Your health care provider can help you make an activity plan that is individually designed and works best for you. °Exercise regularly as told by your health care provider. This may include: °Doing strength training two times a week, such as: °Lifting weights. °Using resistance bands. °Push-ups. °Sit-ups. °Yoga. °Doing a certain intensity of exercise for a given amount of time. Choose from these options: °A total of 150 minutes of moderate-intensity exercise every week. °A total of 75 minutes of vigorous-intensity exercise every week. °A mix of moderate-intensity and  vigorous-intensity exercise every week. °Children, pregnant women, people who have not exercised regularly, people who are overweight, and older adults may need to talk with a health care provider about what activities are safe to perform. If you have a medical condition, be sure to talk with your health care provider before you start a new exercise program. °What are some exercise ideas? °Moderate-intensity exercise ideas include: °Walking 1 mile (1.6 km) in about 15 minutes. °Biking. °Hiking. °Golfing. °Dancing. °Water aerobics. °Vigorous-intensity exercise ideas include: °Walking 4.5 miles (7.2 km) or more in about 1 hour. °Jogging or running 5 miles (8 km) in about 1 hour. °Biking 10 miles (16.1 km) or more in about 1 hour. °Lap swimming. °Roller-skating or in-line skating. °Cross-country skiing. °Vigorous competitive sports, such as football, basketball, and soccer. °Jumping rope. °Aerobic dancing. °What are some everyday activities that can help me get exercise? °Yard work, such as: °Pushing a lawn mower. °Raking and bagging leaves. °Washing your car. °Pushing a stroller. °Shoveling snow. °Gardening. °Washing windows or floors. °How can I be more active in my day-to-day activities? °Use stairs instead of an elevator. °Take a walk during your lunch break. °If you drive, park your car farther away from your work or school. °If you take public transportation, get off one stop early and walk the rest of the way. °Stand up or walk around during all of your indoor phone calls. °Get up, stretch, and walk around every 30 minutes throughout the day. °Enjoy exercise with a friend. Support to continue exercising will help you keep a regular routine of activity. °Where to find more information °You can find more information about exercising to stay healthy from: °U.S. Department of Health and Human Services: www.hhs.gov °Centers for Disease Control and Prevention (  CDC): www.cdc.gov °Summary °Exercising regularly is  important. It will improve your overall fitness, flexibility, and endurance. °Regular exercise will also improve your overall health. It can help you control your weight, reduce stress, and improve your bone density. °Do not exercise so much that you hurt yourself, feel dizzy, or get very short of breath. °Before you start a new exercise program, talk with your health care provider. °This information is not intended to replace advice given to you by your health care provider. Make sure you discuss any questions you have with your health care provider. °Document Revised: 11/13/2020 Document Reviewed: 11/13/2020 °Elsevier Patient Education © 2022 [employer] Inc. ° °

## 2021-06-22 NOTE — Progress Notes (Signed)
Subjective:  Patient ID: Jeffery Graves, male    DOB: March 20, 1956  Age: 65 y.o. MRN: [id number]  CC: Hypertension   HPI Jeffery Graves is a 65 y.o. year old male with a history of Adenocarcinoma of the prostate (s/p treatment), Benign prostatic hypertrophy, hypertension, allergic rhinitis who comes into the clinic for a follow up visit  Interval History: He remains on Flomax for his lower urinary tract symptoms and is followed by urology due to previous history of prostate cancer. Last PSA was elevated at 6.3 in 09/2020.  He is asymptomatic.  Request refill of Viagra for erectile dysfunction. CT abdomen and pelvis from 11/2020 revealed: IMPRESSION: 1. No acute intra-abdominal or pelvic pathology. No adenopathy or new bone lesions. 2. Colonic diverticulosis. No bowel obstruction. Normal appendix. 3. Aortic Atherosclerosis (ICD10-I70.0).  Compliant with his antihypertensive and denies adverse effects from his medications.  Denies presence of chest pain or dyspnea.  Exercises regularly. He is not ready to receive the flu shot today but would like to come back for this. Past Medical History:  Diagnosis Date   BPH (benign prostatic hyperplasia)    Hx of radiation therapy 09/06/10 - 10/29/10   prostate, seminal vesicles   Hypertension    on meds   Prostate cancer ([place]) 04/26/2010   gleason 7, vol 20.88 cc    Past Surgical History:  Procedure Laterality Date   BIOPSY PROSTATE      Family History  Problem Relation Age of Onset   Stroke Mother    Colon cancer Neg Hx    Colon polyps Neg Hx    Esophageal cancer Neg Hx    Stomach cancer Neg Hx    Rectal cancer Neg Hx     No Known Allergies  Outpatient Medications Prior to Visit  Medication Sig Dispense Refill   amLODipine (NORVASC) 10 MG tablet TAKE 1 TABLET (10 MG TOTAL) BY MOUTH DAILY. 30 tablet 6   lisinopril-hydrochlorothiazide (ZESTORETIC) 20-25 MG tablet TAKE 1 TABLET BY MOUTH DAILY. 30 tablet 6   sildenafil (VIAGRA) 50 MG  tablet TAKE 1 TABLET BY MOUTH ONCE DAILY AS NEEDED FOR ERECTILE DYSFUNCTION. AT LEAST 24 HOURS BETWEEN DOSES 10 tablet 2   tamsulosin (FLOMAX) 0.4 MG CAPS capsule TAKE 1 CAPSULE (0.4 MG TOTAL) BY MOUTH DAILY AFTER BREAKFAST. 30 capsule 6   No facility-administered medications prior to visit.     ROS Review of Systems  Constitutional:  Negative for activity change and appetite change.  HENT:  Negative for sinus pressure and sore throat.   Eyes:  Negative for visual disturbance.  Respiratory:  Negative for cough, chest tightness and shortness of breath.   Cardiovascular:  Negative for chest pain and leg swelling.  Gastrointestinal:  Negative for abdominal distention, abdominal pain, constipation and diarrhea.  Endocrine: Negative.   Genitourinary:  Negative for dysuria.  Musculoskeletal:  Negative for joint swelling and myalgias.  Skin:  Negative for rash.  Allergic/Immunologic: Negative.   Neurological:  Negative for weakness, light-headedness and numbness.  Psychiatric/Behavioral:  Negative for dysphoric mood and suicidal ideas.    Objective:  BP 117/76   Pulse 85   Ht 5\' 6"  (1.676 m)   Wt 190 lb 9.6 oz (86.5 kg)   SpO2 99%   BMI 30.76 kg/m   BP/Weight 06/22/2021 12/08/2020 3/53/6144  Systolic BP 315 400 867  Diastolic BP 76 74 73  Wt. (Lbs) 190.6 192.4 194  BMI 30.76 31.05 31.31      Physical Exam Constitutional:  Appearance: He is well-developed.  Cardiovascular:     Rate and Rhythm: Normal rate.     Heart sounds: Normal heart sounds. No murmur heard. Pulmonary:     Effort: Pulmonary effort is normal.     Breath sounds: Normal breath sounds. No wheezing or rales.  Chest:     Chest wall: No tenderness.  Abdominal:     General: Bowel sounds are normal. There is no distension.     Palpations: Abdomen is soft. There is no mass.     Tenderness: There is no abdominal tenderness.  Musculoskeletal:        General: Normal range of motion.     Right lower leg: No  edema.     Left lower leg: No edema.  Neurological:     Mental Status: He is alert and oriented to person, place, and time.  Psychiatric:        Mood and Affect: Mood normal.    CMP Latest Ref Rng & Units 12/08/2020 09/24/2020 11/18/2019  Glucose 65 - 99 mg/dL - 93 93  BUN 8 - 27 mg/dL - 8 15  Creatinine 0.61 - 1.24 mg/dL 0.90 1.09 1.04  Sodium 134 - 144 mmol/L - 138 138  Potassium 3.5 - 5.2 mmol/L - 3.5 3.7  Chloride 96 - 106 mmol/L - 98 100  CO2 20 - 29 mmol/L - 25 25  Calcium 8.6 - 10.2 mg/dL - 9.6 9.3  Total Protein 6.0 - 8.5 g/dL - 7.2 -  Total Bilirubin 0.0 - 1.2 mg/dL - 0.8 -  Alkaline Phos 44 - 121 IU/L - 68 -  AST 0 - 40 IU/L - 21 -  ALT 0 - 44 IU/L - 22 -    Lipid Panel     Component Value Date/Time   CHOL 140 11/05/2018 0848   TRIG 36 11/05/2018 0848   HDL 46 11/05/2018 0848   CHOLHDL 3.0 11/05/2018 0848   CHOLHDL 2.1 11/11/2015 0902   VLDL 7 11/11/2015 0902   LDLCALC 87 11/05/2018 0848    CBC    Component Value Date/Time   WBC 5.3 02/05/2018 0847   WBC 4.3 05/29/2013 1426   RBC 5.54 02/05/2018 0847   RBC 5.32 05/29/2013 1426   HGB 15.2 02/05/2018 0847   HCT 45.9 02/05/2018 0847   PLT 173 02/05/2018 0847   MCV 83 02/05/2018 0847   MCH 27.4 02/05/2018 0847   MCH 26.5 05/29/2013 1426   MCHC 33.1 02/05/2018 0847   MCHC 34.7 05/29/2013 1426   RDW 14.4 02/05/2018 0847   LYMPHSABS 1.7 02/05/2018 0847   MONOABS 0.6 01/27/2010 2219   EOSABS 0.1 02/05/2018 0847   BASOSABS 0.0 02/05/2018 0847    Lab Results  Component Value Date   HGBA1C 5.5 02/23/2015   Assessment & Plan:  1. Essential hypertension Controlled Continue current regimen Counseled on blood pressure goal of less than 130/80, low-sodium, DASH diet, medication compliance, 150 minutes of moderate intensity exercise per week. Discussed medication compliance, adverse effects. - Basic Metabolic Panel  2. Increasing PSA level after treatment for prostate cancer Last PSA was elevated at  6.3 Last CT scan of the abdomen and pelvis from 11/2020 revealed no acute intra-abdominal or pelvic pathology or new bone lesions. He is due for repeat PSA but would like to see his urologist at [facility] urology after which labs will be ordered  3. Benign prostatic hyperplasia without lower urinary tract symptoms Currently on Flomax  4. Erectile dysfunction, unspecified erectile dysfunction type Stable -  sildenafil (VIAGRA) 50 MG tablet; TAKE 1 TABLET BY MOUTH ONCE DAILY AS NEEDED FOR ERECTILE DYSFUNCTION. AT LEAST 24 HOURS BETWEEN DOSES  Dispense: 10 tablet; Refill: 2   Health Care Maintenance: He is not ready to receive the flu shot today. No orders of the defined types were placed in this encounter.   Follow-up: Return in about 6 months (around 12/20/2021) for Chronic medical conditions.       Charlott Rakes, MD, FAAFP. [facility] [place], [place]   06/22/2021, 9:30 AM

## 2021-06-23 ENCOUNTER — Other Ambulatory Visit: Payer: Self-pay

## 2021-06-23 ENCOUNTER — Telehealth: Payer: Self-pay

## 2021-06-23 LAB — BASIC METABOLIC PANEL
BUN/Creatinine Ratio: 12 (ref 10–24)
BUN: 12 mg/dL (ref 8–27)
CO2: 23 mmol/L (ref 20–29)
Calcium: 9.6 mg/dL (ref 8.6–10.2)
Chloride: 95 mmol/L — ABNORMAL LOW (ref 96–106)
Creatinine, Ser: 0.98 mg/dL (ref 0.76–1.27)
Glucose: 85 mg/dL (ref 70–99)
Potassium: 3.5 mmol/L (ref 3.5–5.2)
Sodium: 135 mmol/L (ref 134–144)
eGFR: 87 mL/min/{1.73_m2} (ref >59)

## 2021-06-23 NOTE — Telephone Encounter (Signed)
-----   Message from Charlott Rakes, MD sent at 06/23/2021 12:15 PM EST ----- Please inform the patient that labs are normal. Thank you.

## 2021-06-23 NOTE — Telephone Encounter (Signed)
Patient name and DOB has been verified Patient was informed of lab results. Patient had no questions.  

## 2021-07-12 ENCOUNTER — Other Ambulatory Visit: Payer: Self-pay

## 2021-07-12 ENCOUNTER — Ambulatory Visit: Payer: Self-pay | Attending: Family Medicine

## 2021-07-12 DIAGNOSIS — N400 Enlarged prostate without lower urinary tract symptoms: Secondary | ICD-10-CM

## 2021-07-13 LAB — PSA, TOTAL AND FREE
PSA, Free Pct: 4.2 %
PSA, Free: 0.41 ng/mL
Prostate Specific Ag, Serum: 9.8 ng/mL — ABNORMAL HIGH (ref 0.0–4.0)

## 2021-07-15 ENCOUNTER — Other Ambulatory Visit: Payer: Self-pay

## 2021-07-16 ENCOUNTER — Telehealth: Payer: Self-pay

## 2021-07-16 NOTE — Telephone Encounter (Signed)
-----   Message from Charlott Rakes, MD sent at 07/13/2021  9:46 AM EST ----- Please inform him PSA is 9.8 up from 6.3 previously and he needs to follow-up with urology.  Please fax results to [facility].  Thank you

## 2021-07-16 NOTE — Telephone Encounter (Signed)
Patient name and DOB has been verified Patient was informed of lab results. Patient had no questions.   Results has ben faxed to [facility].

## 2021-07-30 ENCOUNTER — Ambulatory Visit: Payer: Self-pay | Admitting: Family Medicine

## 2021-08-17 ENCOUNTER — Other Ambulatory Visit: Payer: Self-pay

## 2021-09-15 ENCOUNTER — Other Ambulatory Visit: Payer: Self-pay

## 2021-10-14 ENCOUNTER — Other Ambulatory Visit: Payer: Self-pay

## 2021-10-21 ENCOUNTER — Ambulatory Visit: Payer: Self-pay | Admitting: Family Medicine

## 2021-11-10 ENCOUNTER — Other Ambulatory Visit: Payer: Self-pay

## 2021-11-10 ENCOUNTER — Other Ambulatory Visit: Payer: Self-pay | Admitting: Family Medicine

## 2021-11-10 DIAGNOSIS — N400 Enlarged prostate without lower urinary tract symptoms: Secondary | ICD-10-CM

## 2021-11-10 DIAGNOSIS — I10 Essential (primary) hypertension: Secondary | ICD-10-CM

## 2021-11-10 MED ORDER — LISINOPRIL-HYDROCHLOROTHIAZIDE 20-25 MG PO TABS
1.0000 | ORAL_TABLET | Freq: Every day | ORAL | 0 refills | Status: DC
  Filled 2021-11-10: qty 30, 30d supply, fill #0

## 2021-11-10 MED ORDER — TAMSULOSIN HCL 0.4 MG PO CAPS
ORAL_CAPSULE | ORAL | 0 refills | Status: DC
  Filled 2021-11-10: qty 30, 30d supply, fill #0

## 2021-11-10 MED ORDER — AMLODIPINE BESYLATE 10 MG PO TABS
ORAL_TABLET | Freq: Every day | ORAL | 0 refills | Status: DC
  Filled 2021-11-10: qty 30, 30d supply, fill #0

## 2021-11-11 ENCOUNTER — Other Ambulatory Visit: Payer: Self-pay

## 2021-11-12 ENCOUNTER — Other Ambulatory Visit: Payer: Self-pay

## 2021-12-13 ENCOUNTER — Other Ambulatory Visit: Payer: Self-pay | Admitting: Family Medicine

## 2021-12-13 ENCOUNTER — Other Ambulatory Visit: Payer: Self-pay

## 2021-12-13 ENCOUNTER — Ambulatory Visit: Payer: Self-pay | Attending: Family Medicine

## 2021-12-13 DIAGNOSIS — N400 Enlarged prostate without lower urinary tract symptoms: Secondary | ICD-10-CM

## 2021-12-13 DIAGNOSIS — I10 Essential (primary) hypertension: Secondary | ICD-10-CM

## 2021-12-13 MED ORDER — TAMSULOSIN HCL 0.4 MG PO CAPS
ORAL_CAPSULE | ORAL | 0 refills | Status: DC
  Filled 2021-12-13: qty 30, 30d supply, fill #0

## 2021-12-13 MED ORDER — AMLODIPINE BESYLATE 10 MG PO TABS
ORAL_TABLET | Freq: Every day | ORAL | 0 refills | Status: DC
  Filled 2021-12-13: qty 30, 30d supply, fill #0

## 2021-12-13 MED ORDER — LISINOPRIL-HYDROCHLOROTHIAZIDE 20-25 MG PO TABS
1.0000 | ORAL_TABLET | Freq: Every day | ORAL | 0 refills | Status: DC
  Filled 2021-12-13: qty 30, 30d supply, fill #0

## 2021-12-14 LAB — PSA, TOTAL AND FREE
PSA, Free Pct: 6.7 %
PSA, Free: 0.06 ng/mL
Prostate Specific Ag, Serum: 0.9 ng/mL (ref 0.0–4.0)

## 2021-12-15 ENCOUNTER — Other Ambulatory Visit: Payer: Self-pay

## 2021-12-15 ENCOUNTER — Ambulatory Visit: Payer: Self-pay | Admitting: *Deleted

## 2021-12-15 ENCOUNTER — Encounter: Payer: Self-pay | Admitting: Family Medicine

## 2021-12-15 ENCOUNTER — Ambulatory Visit: Payer: Self-pay | Attending: Family Medicine | Admitting: Family Medicine

## 2021-12-15 VITALS — BP 135/70 | HR 72 | Ht 66.0 in | Wt 187.8 lb

## 2021-12-15 DIAGNOSIS — N400 Enlarged prostate without lower urinary tract symptoms: Secondary | ICD-10-CM

## 2021-12-15 DIAGNOSIS — Z8546 Personal history of malignant neoplasm of prostate: Secondary | ICD-10-CM

## 2021-12-15 DIAGNOSIS — I10 Essential (primary) hypertension: Secondary | ICD-10-CM

## 2021-12-15 MED ORDER — TAMSULOSIN HCL 0.4 MG PO CAPS
ORAL_CAPSULE | Freq: Every day | ORAL | 6 refills | Status: DC
  Filled 2021-12-15: qty 30, fill #0
  Filled 2022-01-13: qty 30, 30d supply, fill #0
  Filled 2022-02-09: qty 30, 30d supply, fill #1
  Filled 2022-03-11: qty 30, 30d supply, fill #2
  Filled 2022-04-12: qty 30, 30d supply, fill #3
  Filled 2022-05-12: qty 30, 30d supply, fill #4
  Filled 2022-06-08: qty 30, 30d supply, fill #5
  Filled 2022-07-08: qty 30, 30d supply, fill #6

## 2021-12-15 MED ORDER — LISINOPRIL-HYDROCHLOROTHIAZIDE 20-25 MG PO TABS
1.0000 | ORAL_TABLET | Freq: Every day | ORAL | 6 refills | Status: DC
  Filled 2021-12-15 – 2022-01-13 (×2): qty 30, 30d supply, fill #0
  Filled 2022-02-09: qty 30, 30d supply, fill #1
  Filled 2022-03-11: qty 30, 30d supply, fill #2
  Filled 2022-04-12: qty 30, 30d supply, fill #3
  Filled 2022-05-12: qty 30, 30d supply, fill #4
  Filled 2022-06-08: qty 30, 30d supply, fill #5
  Filled 2022-07-08: qty 30, 30d supply, fill #6

## 2021-12-15 MED ORDER — AMLODIPINE BESYLATE 10 MG PO TABS
ORAL_TABLET | Freq: Every day | ORAL | 6 refills | Status: DC
  Filled 2021-12-15: qty 30, fill #0
  Filled 2022-01-13: qty 30, 30d supply, fill #0
  Filled 2022-02-09: qty 30, 30d supply, fill #1
  Filled 2022-03-11: qty 30, 30d supply, fill #2
  Filled 2022-04-12: qty 30, 30d supply, fill #3
  Filled 2022-05-12: qty 30, 30d supply, fill #4
  Filled 2022-06-08: qty 30, 30d supply, fill #5
  Filled 2022-07-08: qty 30, 30d supply, fill #6

## 2021-12-15 NOTE — Telephone Encounter (Signed)
Pt given lab results per notes of Dr. Margarita Rana  on 12/15/21. Pt verbalized understanding.  ?

## 2021-12-15 NOTE — Progress Notes (Signed)
? ?Subjective:  ?Patient ID: Jeffery Graves, male    DOB: 11-Aug-1955  Age: 66 y.o. MRN: [id number] ? ?CC: Hypertension ? ? ?HPI ?Jeffery Graves is a 66 y.o. year old male with a history of Adenocarcinoma of the prostate (s/p treatment), Benign prostatic hypertrophy, hypertension, allergic rhinitis who comes into the clinic for a follow up visit ? ?Interval History: ?His PCPs has been trending up and notes reviewed from [facility] from 07/2021 revealed concern for probable micrometastatic disease and hormonal therapy was initiated.  Per patient he has been receiving his shots from [facility] and next appointment is in about 2 to 3 months. ?He denies presence of urinary symptoms. ?Most recent PSA from 2 days ago was 0.9 down from 9.8, five months prior. ? ? ?He has had a dry cough for the last few days along with sneezing and has been using cough drops with some improvement. ? ?Doing well on his antihypertensive. ?Past Medical History:  ?Diagnosis Date  ? BPH (benign prostatic hyperplasia)   ? Hx of radiation therapy 09/06/10 - 10/29/10  ? prostate, seminal vesicles  ? Hypertension   ? on meds  ? Prostate cancer ([place]) 04/26/2010  ? gleason 7, vol 20.88 cc  ? ? ?Past Surgical History:  ?Procedure Laterality Date  ? BIOPSY PROSTATE    ? ? ?Family History  ?Problem Relation Age of Onset  ? Stroke Mother   ? Colon cancer Neg Hx   ? Colon polyps Neg Hx   ? Esophageal cancer Neg Hx   ? Stomach cancer Neg Hx   ? Rectal cancer Neg Hx   ? ? ?Social History  ? ?Socioeconomic History  ? Marital status: Divorced  ?  Spouse name: Not on file  ? Number of children: Not on file  ? Years of education: Not on file  ? Highest education level: Not on file  ?Occupational History  ? Not on file  ?Tobacco Use  ? Smoking status: Former  ? Smokeless tobacco: Never  ?Vaping Use  ? Vaping Use: Never used  ?Substance and Sexual Activity  ? Alcohol use: Yes  ?  Alcohol/week: 7.0 standard drinks  ?  Types: 7 Standard drinks or equivalent per  week  ? Drug use: No  ? Sexual activity: Not on file  ?Other Topics Concern  ? Not on file  ?Social History Narrative  ? Not on file  ? ?Social Determinants of Health  ? ?Financial Resource Strain: Not on file  ?Food Insecurity: Not on file  ?Transportation Needs: Not on file  ?Physical Activity: Not on file  ?Stress: Not on file  ?Social Connections: Not on file  ? ? ?No Known Allergies ? ?Outpatient Medications Prior to Visit  ?Medication Sig Dispense Refill  ? sildenafil (VIAGRA) 50 MG tablet TAKE 1 TABLET BY MOUTH ONCE DAILY AS NEEDED FOR ERECTILE DYSFUNCTION. AT LEAST 24 HOURS BETWEEN DOSES 10 tablet 2  ? amLODipine (NORVASC) 10 MG tablet TAKE 1 TABLET (10 MG TOTAL) BY MOUTH DAILY.Must have office visit for refills 30 tablet 0  ? lisinopril-hydrochlorothiazide (ZESTORETIC) 20-25 MG tablet TAKE 1 TABLET BY MOUTH DAILY.Must have office visit for refills 30 tablet 0  ? tamsulosin (FLOMAX) 0.4 MG CAPS capsule TAKE 1 CAPSULE (0.4 MG TOTAL) BY MOUTH DAILY AFTER BREAKFAST.Must have office visit for refills 30 capsule 0  ? ?No facility-administered medications prior to visit.  ? ? ? ?ROS ?Review of Systems  ?Constitutional:  Negative for activity change and appetite change.  ?  HENT:  Positive for sneezing. Negative for sinus pressure and sore throat.   ?Eyes:  Negative for visual disturbance.  ?Respiratory:  Positive for cough. Negative for chest tightness and shortness of breath.   ?Cardiovascular:  Negative for chest pain and leg swelling.  ?Gastrointestinal:  Negative for abdominal distention, abdominal pain, constipation and diarrhea.  ?Endocrine: Negative.   ?Genitourinary:  Negative for dysuria.  ?Musculoskeletal:  Negative for joint swelling and myalgias.  ?Skin:  Negative for rash.  ?Allergic/Immunologic: Negative.   ?Neurological:  Negative for weakness, light-headedness and numbness.  ?Psychiatric/Behavioral:  Negative for dysphoric mood and suicidal ideas.   ? ?Objective:  ?BP 135/70   Pulse 72   Ht '5\' 6"'$   (6.301 m)   Wt 187 lb 12.8 oz (85.2 kg)   SpO2 99%   BMI 30.31 kg/m?  ? ? ?  12/15/2021  ?  3:27 PM 12/15/2021  ?  3:04 PM 06/22/2021  ?  9:02 AM  ?BP/Weight  ?Systolic BP 601 093 235  ?Diastolic BP 70 81 76  ?Wt. (Lbs)  187.8 190.6  ?BMI  30.31 kg/m2 30.76 kg/m2  ? ? ? ? ?Physical Exam ?Constitutional:   ?   Appearance: He is well-developed.  ?HENT:  ?   Right Ear: Tympanic membrane normal.  ?   Left Ear: Tympanic membrane normal.  ?   Mouth/Throat:  ?   Mouth: Mucous membranes are moist.  ?Cardiovascular:  ?   Rate and Rhythm: Normal rate.  ?   Heart sounds: Normal heart sounds. No murmur heard. ?Pulmonary:  ?   Effort: Pulmonary effort is normal.  ?   Breath sounds: Normal breath sounds. No wheezing or rales.  ?Chest:  ?   Chest wall: No tenderness.  ?Abdominal:  ?   General: Bowel sounds are normal. There is no distension.  ?   Palpations: Abdomen is soft. There is no mass.  ?   Tenderness: There is no abdominal tenderness.  ?Musculoskeletal:     ?   General: Normal range of motion.  ?   Right lower leg: No edema.  ?   Left lower leg: No edema.  ?Neurological:  ?   Mental Status: He is alert and oriented to person, place, and time.  ?Psychiatric:     ?   Mood and Affect: Mood normal.  ? ? ? ?  Latest Ref Rng & Units 06/22/2021  ?  9:40 AM 12/08/2020  ?  8:33 AM 09/24/2020  ? 10:00 AM  ?CMP  ?Glucose 70 - 99 mg/dL 85    93    ?BUN 8 - 27 mg/dL 12    8    ?Creatinine 0.76 - 1.27 mg/dL 0.98   0.90   1.09    ?Sodium 134 - 144 mmol/L 135    138    ?Potassium 3.5 - 5.2 mmol/L 3.5    3.5    ?Chloride 96 - 106 mmol/L 95    98    ?CO2 20 - 29 mmol/L 23    25    ?Calcium 8.6 - 10.2 mg/dL 9.6    9.6    ?Total Protein 6.0 - 8.5 g/dL   7.2    ?Total Bilirubin 0.0 - 1.2 mg/dL   0.8    ?Alkaline Phos 44 - 121 IU/L   68    ?AST 0 - 40 IU/L   21    ?ALT 0 - 44 IU/L   22    ? ? ?Lipid Panel  ?   ?  Component Value Date/Time  ? CHOL 140 11/05/2018 0848  ? TRIG 36 11/05/2018 0848  ? HDL 46 11/05/2018 0848  ? CHOLHDL 3.0 11/05/2018  0848  ? CHOLHDL 2.1 11/11/2015 0902  ? VLDL 7 11/11/2015 0902  ? B and E 87 11/05/2018 0848  ? ? ?CBC ?   ?Component Value Date/Time  ? WBC 5.3 02/05/2018 0847  ? WBC 4.3 05/29/2013 1426  ? RBC 5.54 02/05/2018 0847  ? RBC 5.32 05/29/2013 1426  ? HGB 15.2 02/05/2018 0847  ? HCT 45.9 02/05/2018 0847  ? PLT 173 02/05/2018 0847  ? MCV 83 02/05/2018 0847  ? MCH 27.4 02/05/2018 0847  ? MCH 26.5 05/29/2013 1426  ? MCHC 33.1 02/05/2018 0847  ? MCHC 34.7 05/29/2013 1426  ? RDW 14.4 02/05/2018 0847  ? LYMPHSABS 1.7 02/05/2018 0847  ? MONOABS 0.6 01/27/2010 2219  ? EOSABS 0.1 02/05/2018 0847  ? BASOSABS 0.0 02/05/2018 0847  ? ? ?Lab Results  ?Component Value Date  ? HGBA1C 5.5 02/23/2015  ? ? ?Assessment & Plan:  ?1. Essential hypertension ?Controlled ?Counseled on blood pressure goal of less than 130/80, low-sodium, DASH diet, medication compliance, 150 minutes of moderate intensity exercise per week. ?Discussed medication compliance, adverse effects. ?- amLODipine (NORVASC) 10 MG tablet; TAKE 1 TABLET (10 MG TOTAL) BY MOUTH DAILY.Must have office visit for refills  Dispense: 30 tablet; Refill: 6 ?- lisinopril-hydrochlorothiazide (ZESTORETIC) 20-25 MG tablet; TAKE 1 TABLET BY MOUTH DAILY.Must have office visit for refills  Dispense: 30 tablet; Refill: 6 ?- Basic Metabolic Panel ? ?2. Benign prostatic hyperplasia without lower urinary tract symptoms ?Symptoms are controlled ?- tamsulosin (FLOMAX) 0.4 MG CAPS capsule; TAKE 1 CAPSULE (0.4 MG TOTAL) BY MOUTH DAILY AFTER BREAKFAST.Must have office visit for refills  Dispense: 30 capsule; Refill: 6 ? ?3. History of prostate cancer ?Currently on hormonal treatment ?Last PSA was normal ?Follow[facility]up with [facility] ? ? ? ?Meds ordered this encounter  ?Medications  ? amLODipine (NORVASC) 10 MG tablet  ?  Sig: TAKE 1 TABLET (10 MG TOTAL) BY MOUTH DAILY.Must have office visit for refills  ?  Dispense:  30 tablet  ?  Refill:  6  ?  Must have office visit for refills  ?  lisinopril-hydrochlorothiazide (ZESTORETIC) 20-25 MG tablet  ?  Sig: TAKE 1 TABLET BY MOUTH DAILY.Must have office visit for refills  ?  Dispense:  30 tablet  ?  Refill:  6  ? tamsulosin (FLOMAX) 0.4 MG CAPS capsule  ?  Sig

## 2021-12-15 NOTE — Patient Instructions (Signed)

## 2021-12-16 ENCOUNTER — Other Ambulatory Visit: Payer: Self-pay

## 2021-12-17 LAB — BASIC METABOLIC PANEL
BUN/Creatinine Ratio: 19 (ref 10–24)
BUN: 18 mg/dL (ref 8–27)
CO2: 22 mmol/L (ref 20–29)
Calcium: 9.3 mg/dL (ref 8.6–10.2)
Chloride: 99 mmol/L (ref 96–106)
Creatinine, Ser: 0.94 mg/dL (ref 0.76–1.27)
Glucose: 85 mg/dL (ref 70–99)
Potassium: 3.5 mmol/L (ref 3.5–5.2)
Sodium: 139 mmol/L (ref 134–144)
eGFR: 91 mL/min/{1.73_m2} (ref >59)

## 2021-12-17 LAB — SPECIMEN STATUS REPORT

## 2021-12-21 ENCOUNTER — Ambulatory Visit: Payer: Self-pay | Admitting: Family Medicine

## 2022-01-13 ENCOUNTER — Other Ambulatory Visit: Payer: Self-pay

## 2022-01-14 ENCOUNTER — Other Ambulatory Visit: Payer: Self-pay

## 2022-02-09 ENCOUNTER — Other Ambulatory Visit: Payer: Self-pay

## 2022-03-11 ENCOUNTER — Other Ambulatory Visit: Payer: Self-pay

## 2022-03-14 ENCOUNTER — Other Ambulatory Visit: Payer: Self-pay

## 2022-03-15 ENCOUNTER — Ambulatory Visit: Payer: Self-pay | Attending: Family Medicine

## 2022-03-15 DIAGNOSIS — N400 Enlarged prostate without lower urinary tract symptoms: Secondary | ICD-10-CM

## 2022-03-16 LAB — PSA, TOTAL AND FREE
PSA, Free Pct: 5.8 %
PSA, Free: 0.07 ng/mL
Prostate Specific Ag, Serum: 1.2 ng/mL (ref 0.0–4.0)

## 2022-04-12 ENCOUNTER — Other Ambulatory Visit: Payer: Self-pay

## 2022-04-13 ENCOUNTER — Other Ambulatory Visit: Payer: Self-pay

## 2022-05-12 ENCOUNTER — Other Ambulatory Visit: Payer: Self-pay

## 2022-05-13 ENCOUNTER — Other Ambulatory Visit: Payer: Self-pay

## 2022-06-08 ENCOUNTER — Other Ambulatory Visit: Payer: Self-pay

## 2022-06-10 ENCOUNTER — Other Ambulatory Visit: Payer: Self-pay

## 2022-06-21 ENCOUNTER — Ambulatory Visit: Payer: Self-pay | Attending: Family Medicine | Admitting: Family Medicine

## 2022-06-21 ENCOUNTER — Encounter: Payer: Self-pay | Admitting: Family Medicine

## 2022-06-21 VITALS — BP 155/82 | HR 80 | Temp 98.3°F | Ht 66.0 in | Wt 197.8 lb

## 2022-06-21 DIAGNOSIS — I10 Essential (primary) hypertension: Secondary | ICD-10-CM

## 2022-06-21 DIAGNOSIS — Z8546 Personal history of malignant neoplasm of prostate: Secondary | ICD-10-CM

## 2022-06-21 DIAGNOSIS — Z131 Encounter for screening for diabetes mellitus: Secondary | ICD-10-CM

## 2022-06-21 DIAGNOSIS — N400 Enlarged prostate without lower urinary tract symptoms: Secondary | ICD-10-CM

## 2022-06-21 NOTE — Patient Instructions (Signed)
Exercising to Stay Healthy To become healthy and stay healthy, it is recommended that you do moderate-intensity and vigorous-intensity exercise. You can tell that you are exercising at a moderate intensity if your heart starts beating faster and you start breathing faster but can still hold a conversation. You can tell that you are exercising at a vigorous intensity if you are breathing much harder and faster and cannot hold a conversation while exercising. How can exercise benefit me? Exercising regularly is important. It has many health benefits, such as: Improving overall fitness, flexibility, and endurance. Increasing bone density. Helping with weight control. Decreasing body fat. Increasing muscle strength and endurance. Reducing stress and tension, anxiety, depression, or anger. Improving overall health. What guidelines should I follow while exercising? Before you start a new exercise program, talk with your health care provider. Do not exercise so much that you hurt yourself, feel dizzy, or get very short of breath. Wear comfortable clothes and wear shoes with good support. Drink plenty of water while you exercise to prevent dehydration or heat stroke. Work out until your breathing and your heartbeat get faster (moderate intensity). How often should I exercise? Choose an activity that you enjoy, and set realistic goals. Your health care provider can help you make an activity plan that is individually designed and works best for you. Exercise regularly as told by your health care provider. This may include: Doing strength training two times a week, such as: Lifting weights. Using resistance bands. Push-ups. Sit-ups. Yoga. Doing a certain intensity of exercise for a given amount of time. Choose from these options: A total of 150 minutes of moderate-intensity exercise every week. A total of 75 minutes of vigorous-intensity exercise every week. A mix of moderate-intensity and  vigorous-intensity exercise every week. Children, pregnant women, people who have not exercised regularly, people who are overweight, and older adults may need to talk with a health care provider about what activities are safe to perform. If you have a medical condition, be sure to talk with your health care provider before you start a new exercise program. What are some exercise ideas? Moderate-intensity exercise ideas include: Walking 1 mile (1.6 km) in about 15 minutes. Biking. Hiking. Golfing. Dancing. Water aerobics. Vigorous-intensity exercise ideas include: Walking 4.5 miles (7.2 km) or more in about 1 hour. Jogging or running 5 miles (8 km) in about 1 hour. Biking 10 miles (16.1 km) or more in about 1 hour. Lap swimming. Roller-skating or in-line skating. Cross-country skiing. Vigorous competitive sports, such as football, basketball, and soccer. Jumping rope. Aerobic dancing. What are some everyday activities that can help me get exercise? Yard work, such as: Pushing a lawn mower. Raking and bagging leaves. Washing your car. Pushing a stroller. Shoveling snow. Gardening. Washing windows or floors. How can I be more active in my day-to-day activities? Use stairs instead of an elevator. Take a walk during your lunch break. If you drive, park your car farther away from your work or school. If you take public transportation, get off one stop early and walk the rest of the way. Stand up or walk around during all of your indoor phone calls. Get up, stretch, and walk around every 30 minutes throughout the day. Enjoy exercise with a friend. Support to continue exercising will help you keep a regular routine of activity. Where to find more information You can find more information about exercising to stay healthy from: U.S. Department of Health and Human Services: www.hhs.gov Centers for Disease Control and Prevention (  CDC): www.cdc.gov Summary Exercising regularly is  important. It will improve your overall fitness, flexibility, and endurance. Regular exercise will also improve your overall health. It can help you control your weight, reduce stress, and improve your bone density. Do not exercise so much that you hurt yourself, feel dizzy, or get very short of breath. Before you start a new exercise program, talk with your health care provider. This information is not intended to replace advice given to you by your health care provider. Make sure you discuss any questions you have with your health care provider. Document Revised: 11/13/2020 Document Reviewed: 11/13/2020 Elsevier Patient Education  2023 [employer]  

## 2022-06-21 NOTE — Progress Notes (Signed)
Subjective:  Patient ID: Jeffery Graves, male    DOB: 1955/12/27  Age: 66 y.o. MRN: [id number]  CC: Hypertension   HPI Jeffery Graves is a 66 y.o. year old male with a history of Adenocarcinoma of the prostate (s/p treatment), Benign prostatic hypertrophy, hypertension, allergic rhinitis who comes into the clinic for a follow up visit   Interval History:  His appointment with neurology comes up in 09/2022 for follow-up with BPH and history of prostate Ca. last PSA from 03/2022 was 1.2. His lower urinary tract symptoms are controlled on Flomax He endorses adherence with his antihypertensive and exercises regularly by going to the gym. Denies additional concerns today.  Past Medical History:  Diagnosis Date   BPH (benign prostatic hyperplasia)    Hx of radiation therapy 09/06/10 - 10/29/10   prostate, seminal vesicles   Hypertension    on meds   Prostate cancer ([facility]) 04/26/2010   gleason 7, vol 20.88 cc    Past Surgical History:  Procedure Laterality Date   BIOPSY PROSTATE      Family History  Problem Relation Age of Onset   Stroke Mother    Colon cancer Neg Hx    Colon polyps Neg Hx    Esophageal cancer Neg Hx    Stomach cancer Neg Hx    Rectal cancer Neg Hx     Social History   Socioeconomic History   Marital status: Divorced    Spouse name: Not on file   Number of children: Not on file   Years of education: Not on file   Highest education level: Not on file  Occupational History   Not on file  Tobacco Use   Smoking status: Former   Smokeless tobacco: Never  Vaping Use   Vaping Use: Never used  Substance and Sexual Activity   Alcohol use: Yes    Alcohol/week: 7.0 standard drinks of alcohol    Types: 7 Standard drinks or equivalent per week   Drug use: No   Sexual activity: Not on file  Other Topics Concern   Not on file  Social History Narrative   Not on file   Social Determinants of Health   Financial Resource Strain: Not on file  Food Insecurity: Not  on file  Transportation Needs: Not on file  Physical Activity: Not on file  Stress: Not on file  Social Connections: Not on file    No Known Allergies  Outpatient Medications Prior to Visit  Medication Sig Dispense Refill   amLODipine (NORVASC) 10 MG tablet TAKE 1 TABLET (10 MG TOTAL) BY MOUTH ONCE DAILY. (Must have office visit for refills) 30 tablet 6   lisinopril-hydrochlorothiazide (ZESTORETIC) 20-25 MG tablet TAKE 1 TABLET BY MOUTH ONCE DAILY. (Must have office visit for refills) 30 tablet 6   sildenafil (VIAGRA) 50 MG tablet TAKE 1 TABLET BY MOUTH ONCE DAILY AS NEEDED FOR ERECTILE DYSFUNCTION. AT LEAST 24 HOURS BETWEEN DOSES 10 tablet 2   tamsulosin (FLOMAX) 0.4 MG CAPS capsule TAKE 1 CAPSULE (0.4 MG TOTAL) BY MOUTH DAILY AFTER BREAKFAST. (Must have office visit for refills) 30 capsule 6   No facility-administered medications prior to visit.     ROS Review of Systems  Constitutional:  Negative for activity change and appetite change.  HENT:  Negative for sinus pressure and sore throat.   Respiratory:  Negative for chest tightness, shortness of breath and wheezing.   Cardiovascular:  Negative for chest pain and palpitations.  Gastrointestinal:  Negative for abdominal  distention, abdominal pain and constipation.  Genitourinary: Negative.   Musculoskeletal: Negative.   Psychiatric/Behavioral:  Negative for behavioral problems and dysphoric mood.     Objective:  BP (!) 155/82   Pulse 80   Temp 98.3 F (36.8 C) (Oral)   Ht _0  (1.676 m)   Wt 197 lb 12.8 oz (89.7 kg)   SpO2 99%   BMI 31.93 kg/m      06/21/2022    8:43 AM 12/15/2021    3:27 PM 12/15/2021    3:04 PM  BP/Weight  Systolic BP 989 211 941  Diastolic BP 82 70 81  Wt. (Lbs) 197.8  187.8  BMI 31.93 kg/m2  30.31 kg/m2      Physical Exam Constitutional:      Appearance: He is well-developed.  Cardiovascular:     Rate and Rhythm: Normal rate.     Heart sounds: Normal heart sounds. No murmur  heard. Pulmonary:     Effort: Pulmonary effort is normal.     Breath sounds: Normal breath sounds. No wheezing or rales.  Chest:     Chest wall: No tenderness.  Abdominal:     General: Bowel sounds are normal. There is no distension.     Palpations: Abdomen is soft. There is no mass.     Tenderness: There is no abdominal tenderness.  Musculoskeletal:        General: Normal range of motion.     Right lower leg: No edema.     Left lower leg: No edema.  Neurological:     Mental Status: He is alert and oriented to person, place, and time.  Psychiatric:        Mood and Affect: Mood normal.        Latest Ref Rng & Units 12/13/2021    8:42 AM 06/22/2021    9:40 AM 12/08/2020    8:33 AM  CMP  Glucose 70 - 99 mg/dL 85  85    BUN 8 - 27 mg/dL 18  12    Creatinine 0.76 - 1.27 mg/dL 0.94  0.98  0.90   Sodium 134 - 144 mmol/L 139  135    Potassium 3.5 - 5.2 mmol/L 3.5  3.5    Chloride 96 - 106 mmol/L 99  95    CO2 20 - 29 mmol/L 22  23    Calcium 8.6 - 10.2 mg/dL 9.3  9.6      Lipid Panel     Component Value Date/Time   CHOL 140 11/05/2018 0848   TRIG 36 11/05/2018 0848   HDL 46 11/05/2018 0848   CHOLHDL 3.0 11/05/2018 0848   CHOLHDL 2.1 11/11/2015 0902   VLDL 7 11/11/2015 0902   LDLCALC 87 11/05/2018 0848    CBC    Component Value Date/Time   WBC 5.3 02/05/2018 0847   WBC 4.3 05/29/2013 1426   RBC 5.54 02/05/2018 0847   RBC 5.32 05/29/2013 1426   HGB 15.2 02/05/2018 0847   HCT 45.9 02/05/2018 0847   PLT 173 02/05/2018 0847   MCV 83 02/05/2018 0847   MCH 27.4 02/05/2018 0847   MCH 26.5 05/29/2013 1426   MCHC 33.1 02/05/2018 0847   MCHC 34.7 05/29/2013 1426   RDW 14.4 02/05/2018 0847   LYMPHSABS 1.7 02/05/2018 0847   MONOABS 0.6 01/27/2010 2219   EOSABS 0.1 02/05/2018 0847   BASOSABS 0.0 02/05/2018 0847    Lab Results  Component Value Date   HGBA1C 5.5 02/23/2015    Assessment &  Plan:  1. Essential hypertension Controlled Continue amlodipine,  lisinopril/HCTZ Counseled on blood pressure goal of less than 130/80, low-sodium, DASH diet, medication compliance, 150 minutes of moderate intensity exercise per week. Discussed medication compliance, adverse effects. - CMP14+EGFR  2. History of prostate cancer In remission We will send of PSA Continue to follow-up with [facility] - CBC with Differential/Platelet - PSA, total and free  3. Benign prostatic hyperplasia without lower urinary tract symptoms Symptoms are controlled on Flomax  4. Screening for diabetes mellitus - Hemoglobin A1c    No orders of the defined types were placed in this encounter.   Follow-up: Return in about 6 months (around 12/20/2022) for Chronic medical conditions.       Charlott Rakes, MD, FAAFP. [facility] [place] [place], [place]   06/21/2022, 9:18 AM

## 2022-06-22 LAB — CMP14+EGFR
ALT: 21 IU/L (ref 0–44)
AST: 24 IU/L (ref 0–40)
Albumin/Globulin Ratio: 1.5 (ref 1.2–2.2)
Albumin: 4.5 g/dL (ref 3.9–4.9)
Alkaline Phosphatase: 89 IU/L (ref 44–121)
BUN/Creatinine Ratio: 13 (ref 10–24)
BUN: 13 mg/dL (ref 8–27)
Bilirubin Total: 0.8 mg/dL (ref 0.0–1.2)
CO2: 26 mmol/L (ref 20–29)
Calcium: 9.6 mg/dL (ref 8.6–10.2)
Chloride: 98 mmol/L (ref 96–106)
Creatinine, Ser: 1.04 mg/dL (ref 0.76–1.27)
Globulin, Total: 3 g/dL (ref 1.5–4.5)
Glucose: 101 mg/dL — ABNORMAL HIGH (ref 70–99)
Potassium: 3.7 mmol/L (ref 3.5–5.2)
Sodium: 138 mmol/L (ref 134–144)
Total Protein: 7.5 g/dL (ref 6.0–8.5)
eGFR: 80 mL/min/{1.73_m2} (ref >59)

## 2022-06-22 LAB — PSA, TOTAL AND FREE
PSA, Free Pct: 5 %
PSA, Free: 0.23 ng/mL
Prostate Specific Ag, Serum: 4.6 ng/mL — ABNORMAL HIGH (ref 0.0–4.0)

## 2022-06-22 LAB — CBC WITH DIFFERENTIAL/PLATELET
Basophils Absolute: 0 10*3/uL (ref 0.0–0.2)
Basos: 0 %
EOS (ABSOLUTE): 0.1 10*3/uL (ref 0.0–0.4)
Eos: 2 %
Hematocrit: 42.8 % (ref 37.5–51.0)
Hemoglobin: 14.2 g/dL (ref 13.0–17.7)
Immature Grans (Abs): 0 10*3/uL (ref 0.0–0.1)
Immature Granulocytes: 0 %
Lymphocytes Absolute: 1.3 10*3/uL (ref 0.7–3.1)
Lymphs: 27 %
MCH: 26.9 pg (ref 26.6–33.0)
MCHC: 33.2 g/dL (ref 31.5–35.7)
MCV: 81 fL (ref 79–97)
Monocytes Absolute: 0.7 10*3/uL (ref 0.1–0.9)
Monocytes: 14 %
Neutrophils Absolute: 2.8 10*3/uL (ref 1.4–7.0)
Neutrophils: 57 %
Platelets: 192 10*3/uL (ref 150–450)
RBC: 5.28 x10E6/uL (ref 4.14–5.80)
RDW: 12.5 % (ref 11.6–15.4)
WBC: 5 10*3/uL (ref 3.4–10.8)

## 2022-06-22 LAB — HEMOGLOBIN A1C
Est. average glucose Bld gHb Est-mCnc: 100 mg/dL
Hgb A1c MFr Bld: 5.1 % (ref 4.8–5.6)

## 2022-07-08 ENCOUNTER — Other Ambulatory Visit: Payer: Self-pay

## 2022-07-08 IMAGING — CT CT ABD-PELV W/ CM
2 of 5 series · 15 of 46 positions shown, 17 images · IV contrast (OMNIPAQUE)
Comparison: CT abdomen pelvis dated 09/18/2018.

CLINICAL DATA: 64-year-old male with prostate cancer. Evaluate for
treatment response.

EXAM:
CT ABDOMEN AND PELVIS WITH CONTRAST
TECHNIQUE: Multidetector CT imaging of the abdomen and pelvis was performed
using the standard protocol following bolus administration of
intravenous contrast.
CONTRAST:  100mL OMNIPAQUE IOHEXOL 300 MG/ML  SOLN

[Series 2: axial st · axial · 0.73mm/px · z∈[-425,-95]mm · 12 of 79 slices shown, 14 images]
[im 7/79  soft-tissue]
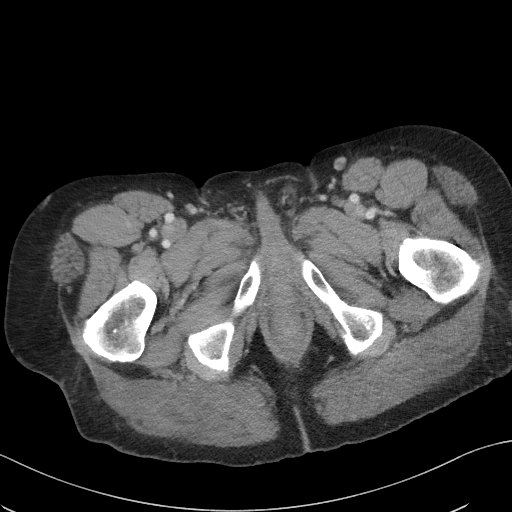
[im 7/79  bone]
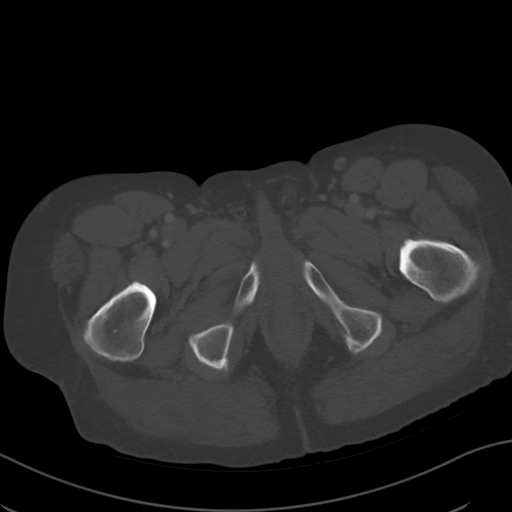
[im 13/79  soft-tissue]
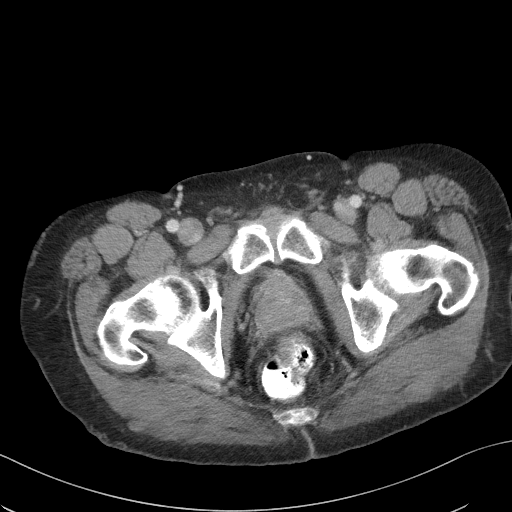
[im 19/79  soft-tissue]
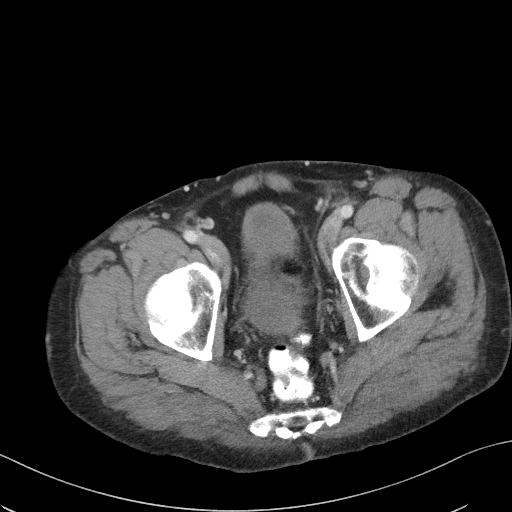
[im 25/79  soft-tissue]
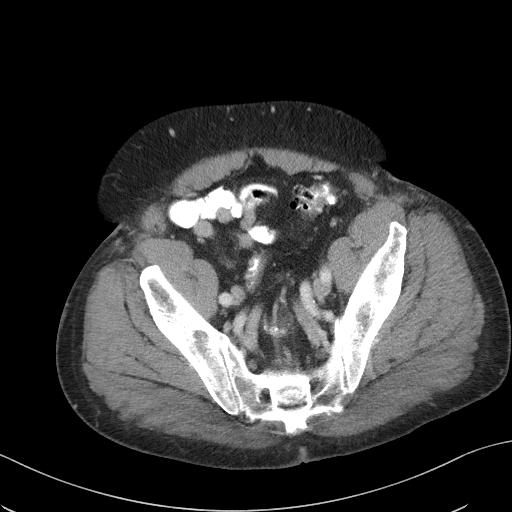
[im 31/79  soft-tissue]
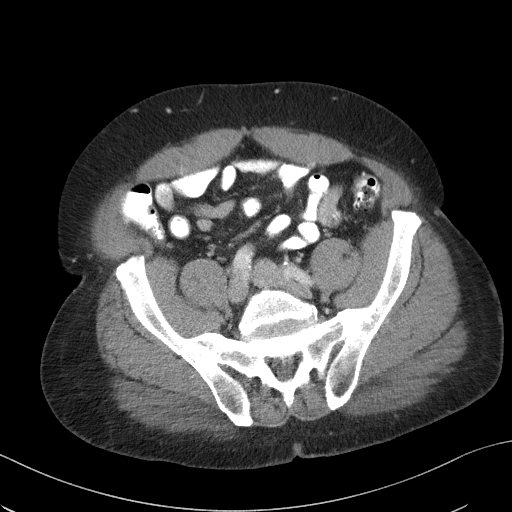
[im 37/79  soft-tissue]
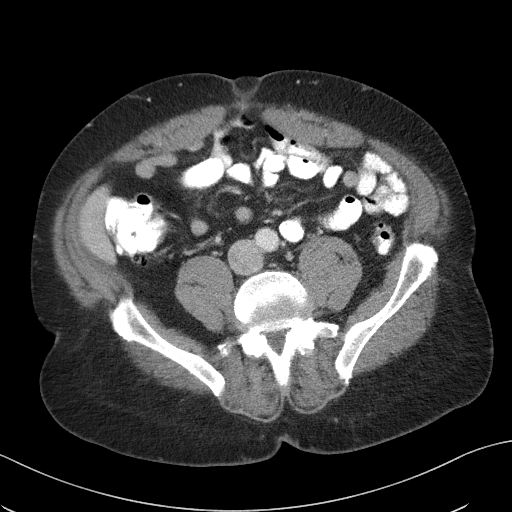
[im 43/79  soft-tissue]
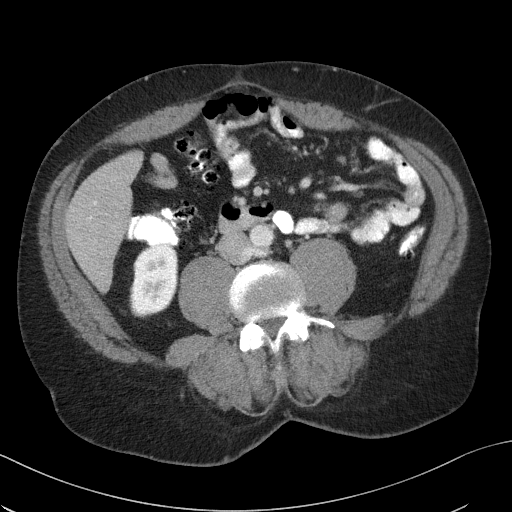
[im 49/79  soft-tissue]
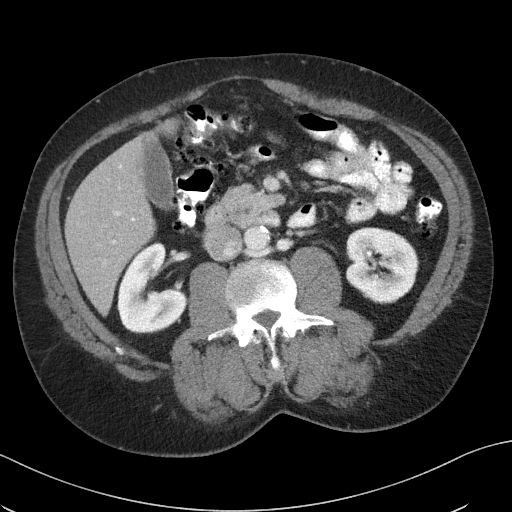
[im 55/79  soft-tissue]
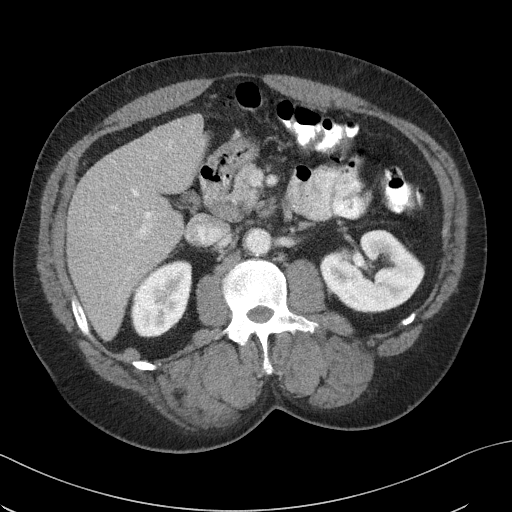
[im 55/79  bone]
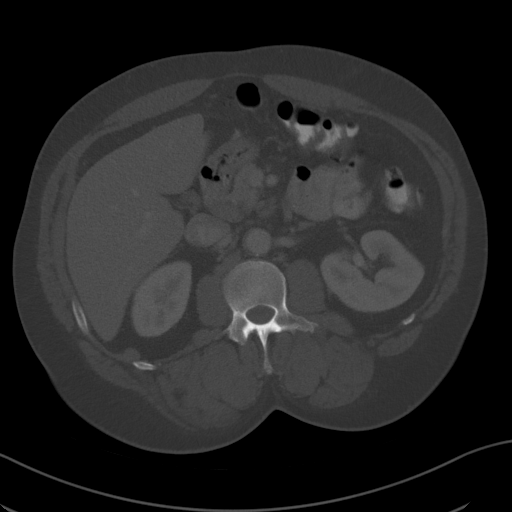
[im 61/79  soft-tissue]
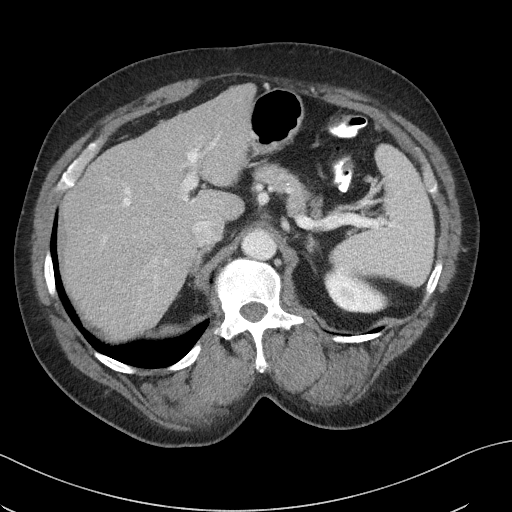
[im 67/79  soft-tissue]
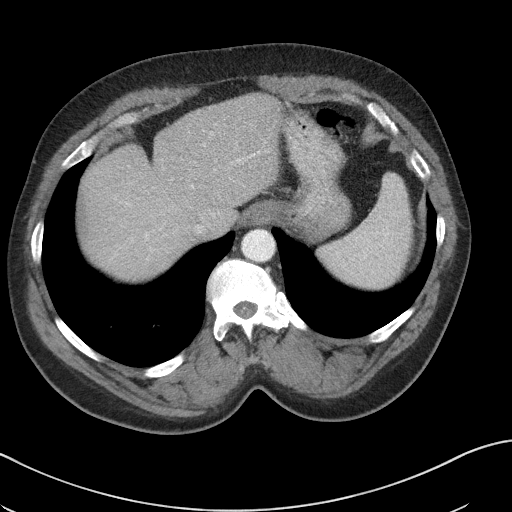
[im 73/79  soft-tissue]
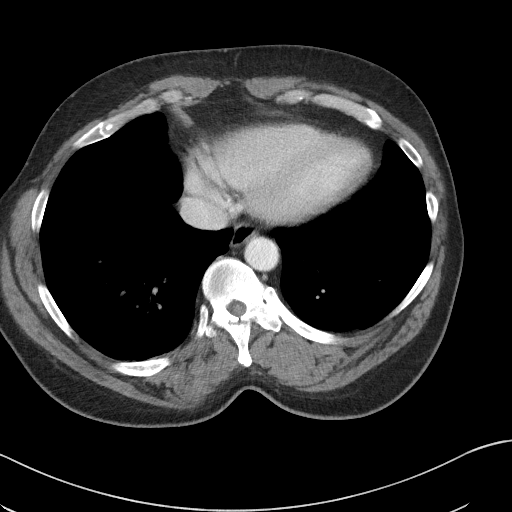

[Series 4: coronal st · coronal · 0.68mm/px · 3 of 100 slices shown]
[im 34/100  soft-tissue]
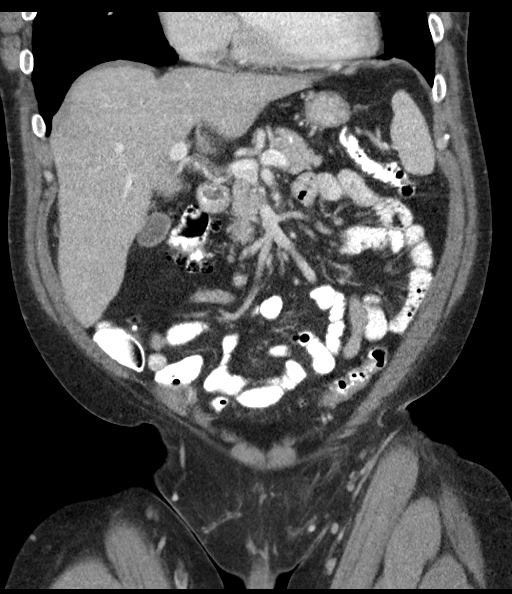
[im 45/100  soft-tissue]
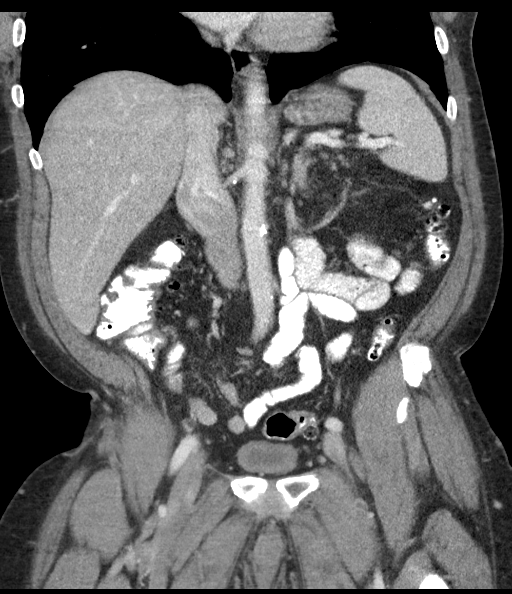
[im 56/100  soft-tissue]
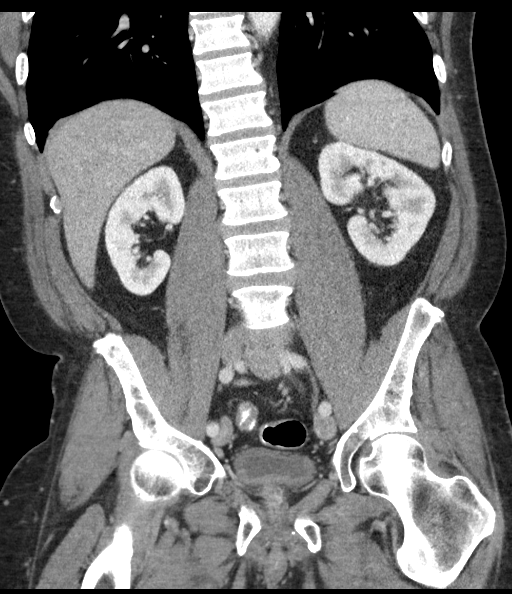

[15 of 46 positions shown; findings below may reference images not displayed]

FINDINGS: Lower chest: The visualized lung bases are clear.

No intra-abdominal free air or free fluid.

Hepatobiliary: No focal liver abnormality is seen. No gallstones,
gallbladder wall thickening, or biliary dilatation.

Pancreas: Unremarkable. No pancreatic ductal dilatation or
surrounding inflammatory changes.

Spleen: Normal in size without focal abnormality.

Adrenals/Urinary Tract: The adrenal glands unremarkable. The
kidneys, visualized ureters appear unremarkable. There is apparent
mild diffuse thickening of the bladder wall which may be partly
related to underdistention. Cystitis is not excluded. Correlation
with urinalysis recommended.

Stomach/Bowel: There is sigmoid diverticulosis and diffuse colonic
diverticula without active inflammatory changes. Duodenal
diverticula measure up to 2.3 cm. There is no bowel obstruction or
active inflammation. The appendix is normal.

Vascular/Lymphatic: There is mild aortoiliac atherosclerotic
disease. The IVC is unremarkable. There is duplication of the left
renal vein with a retroaortic anatomy. No portal venous gas. There
is no adenopathy.

Reproductive: The prostate is grossly unremarkable. Fiducial markers
noted along the posterior margin of the prostate gland.

Other: Small fat containing umbilical hernia.

Musculoskeletal: Small sclerotic focus in the left iliac bone medial
to the left acetabulum, similar to prior CT. Several additional
punctate sclerotic foci involving the L4 and L5 and appears similar
to prior CT. No acute osseous pathology. No new bone lesions.
IMPRESSION: 1. No acute intra-abdominal or pelvic pathology. No adenopathy or
new bone lesions.
2. Colonic diverticulosis. No bowel obstruction. Normal appendix.
3. Aortic Atherosclerosis (T2Z7D-HTC.C).

## 2022-08-08 ENCOUNTER — Other Ambulatory Visit: Payer: Self-pay | Admitting: Family Medicine

## 2022-08-08 ENCOUNTER — Other Ambulatory Visit: Payer: Self-pay

## 2022-08-08 DIAGNOSIS — I10 Essential (primary) hypertension: Secondary | ICD-10-CM

## 2022-08-08 DIAGNOSIS — N400 Enlarged prostate without lower urinary tract symptoms: Secondary | ICD-10-CM

## 2022-08-08 MED ORDER — TAMSULOSIN HCL 0.4 MG PO CAPS
0.4000 mg | ORAL_CAPSULE | Freq: Every day | ORAL | 3 refills | Status: DC
  Filled 2022-08-08: qty 30, 30d supply, fill #0
  Filled 2022-09-08: qty 30, 30d supply, fill #1
  Filled 2022-10-10: qty 30, 30d supply, fill #2
  Filled 2022-11-07: qty 30, 30d supply, fill #3

## 2022-08-08 MED ORDER — LISINOPRIL-HYDROCHLOROTHIAZIDE 20-25 MG PO TABS
1.0000 | ORAL_TABLET | Freq: Every day | ORAL | 3 refills | Status: DC
  Filled 2022-08-08: qty 30, 30d supply, fill #0
  Filled 2022-09-08: qty 30, 30d supply, fill #1
  Filled 2022-10-10: qty 30, 30d supply, fill #2
  Filled 2022-11-07: qty 30, 30d supply, fill #3

## 2022-08-08 MED ORDER — AMLODIPINE BESYLATE 10 MG PO TABS
10.0000 mg | ORAL_TABLET | Freq: Every day | ORAL | 3 refills | Status: DC
  Filled 2022-08-08: qty 30, 30d supply, fill #0
  Filled 2022-09-08: qty 30, 30d supply, fill #1
  Filled 2022-10-10: qty 30, 30d supply, fill #2
  Filled 2022-11-07: qty 30, 30d supply, fill #3

## 2022-08-09 ENCOUNTER — Other Ambulatory Visit: Payer: Self-pay

## 2022-08-11 ENCOUNTER — Other Ambulatory Visit: Payer: Self-pay

## 2022-09-08 ENCOUNTER — Other Ambulatory Visit: Payer: Self-pay

## 2022-09-09 ENCOUNTER — Other Ambulatory Visit: Payer: Self-pay

## 2022-09-14 ENCOUNTER — Ambulatory Visit: Payer: Medicaid Other | Attending: Family Medicine

## 2022-09-14 DIAGNOSIS — N400 Enlarged prostate without lower urinary tract symptoms: Secondary | ICD-10-CM

## 2022-09-16 ENCOUNTER — Other Ambulatory Visit: Payer: Self-pay | Admitting: Family Medicine

## 2022-09-17 LAB — PSA, TOTAL AND FREE
PSA, Free Pct: 4.6 %
PSA, Free: 0.23 ng/mL
Prostate Specific Ag, Serum: 5 ng/mL — ABNORMAL HIGH (ref 0.0–4.0)

## 2022-10-10 ENCOUNTER — Other Ambulatory Visit: Payer: Self-pay

## 2022-10-12 ENCOUNTER — Other Ambulatory Visit (HOSPITAL_COMMUNITY): Payer: Self-pay | Admitting: Urology

## 2022-10-12 DIAGNOSIS — R9721 Rising PSA following treatment for malignant neoplasm of prostate: Secondary | ICD-10-CM

## 2022-10-31 ENCOUNTER — Ambulatory Visit (HOSPITAL_COMMUNITY): Payer: Medicaid Other

## 2022-11-07 ENCOUNTER — Other Ambulatory Visit: Payer: Self-pay

## 2022-11-08 ENCOUNTER — Other Ambulatory Visit: Payer: Self-pay

## 2022-11-15 ENCOUNTER — Telehealth: Payer: Self-pay | Admitting: Pharmacist

## 2022-11-15 NOTE — Progress Notes (Signed)
Patient attempted to be outreached by Michiel Cowboy, PharmD Candidate on 11/15/2022 to discuss hypertension. Left voicemail for patient to return our call at their convenience at [phone].   Michiel Cowboy, PharmD Candidate   Catie Eppie Gibson, PharmD, BCACP, CPP [facility] [phone]

## 2022-11-15 NOTE — Progress Notes (Signed)
Error

## 2022-12-05 ENCOUNTER — Ambulatory Visit (HOSPITAL_COMMUNITY)
Admission: RE | Admit: 2022-12-05 | Discharge: 2022-12-05 | Disposition: A | Discharge status: Home / Self Care | Payer: No Typology Code available for payment source | Source: Ambulatory Visit | Attending: Urology | Admitting: Urology

## 2022-12-05 DIAGNOSIS — R9721 Rising PSA following treatment for malignant neoplasm of prostate: Secondary | ICD-10-CM | POA: Insufficient documentation

## 2022-12-05 MED ORDER — PIFLIFOLASTAT F 18 (PYLARIFY) INJECTION
9.0000 | Freq: Once | INTRAVENOUS | Status: AC
  Administered 2022-12-05: 10 via INTRAVENOUS

## 2022-12-06 ENCOUNTER — Other Ambulatory Visit: Payer: Self-pay | Admitting: Family Medicine

## 2022-12-06 ENCOUNTER — Other Ambulatory Visit: Payer: Self-pay

## 2022-12-06 DIAGNOSIS — I10 Essential (primary) hypertension: Secondary | ICD-10-CM

## 2022-12-06 DIAGNOSIS — N400 Enlarged prostate without lower urinary tract symptoms: Secondary | ICD-10-CM

## 2022-12-06 MED ORDER — TAMSULOSIN HCL 0.4 MG PO CAPS
0.4000 mg | ORAL_CAPSULE | Freq: Every day | ORAL | 0 refills | Status: DC
Start: 2022-12-06 — End: 2022-12-27
  Filled 2022-12-06: qty 30, 30d supply, fill #0

## 2022-12-06 MED ORDER — AMLODIPINE BESYLATE 10 MG PO TABS
10.0000 mg | ORAL_TABLET | Freq: Every day | ORAL | 0 refills | Status: DC
  Filled 2022-12-06: qty 30, 30d supply, fill #0

## 2022-12-06 MED ORDER — LISINOPRIL-HYDROCHLOROTHIAZIDE 20-25 MG PO TABS
1.0000 | ORAL_TABLET | Freq: Every day | ORAL | 0 refills | Status: DC
Start: 2022-12-06 — End: 2022-12-27
  Filled 2022-12-06: qty 30, 30d supply, fill #0

## 2022-12-27 ENCOUNTER — Other Ambulatory Visit: Payer: Self-pay

## 2022-12-27 ENCOUNTER — Ambulatory Visit: Payer: No Typology Code available for payment source | Attending: Family Medicine | Admitting: Family Medicine

## 2022-12-27 ENCOUNTER — Encounter: Payer: Self-pay | Admitting: Family Medicine

## 2022-12-27 VITALS — BP 132/72 | HR 65 | Temp 98.5°F | Ht 66.0 in | Wt 195.4 lb

## 2022-12-27 DIAGNOSIS — Z8546 Personal history of malignant neoplasm of prostate: Secondary | ICD-10-CM

## 2022-12-27 DIAGNOSIS — N400 Enlarged prostate without lower urinary tract symptoms: Secondary | ICD-10-CM

## 2022-12-27 DIAGNOSIS — H11001 Unspecified pterygium of right eye: Secondary | ICD-10-CM | POA: Diagnosis not present

## 2022-12-27 DIAGNOSIS — I10 Essential (primary) hypertension: Secondary | ICD-10-CM | POA: Diagnosis not present

## 2022-12-27 MED ORDER — LISINOPRIL-HYDROCHLOROTHIAZIDE 20-25 MG PO TABS
1.0000 | ORAL_TABLET | Freq: Every day | ORAL | 1 refills | Status: DC
Start: 2022-12-27 — End: 2023-06-21
  Filled 2022-12-27 – 2023-01-04 (×3): qty 90, 90d supply, fill #0
  Filled 2023-04-04: qty 90, 90d supply, fill #1

## 2022-12-27 MED ORDER — TAMSULOSIN HCL 0.4 MG PO CAPS
0.4000 mg | ORAL_CAPSULE | Freq: Every day | ORAL | 1 refills | Status: DC
Start: 2022-12-27 — End: 2023-05-26
  Filled 2022-12-27 – 2023-01-04 (×3): qty 90, 90d supply, fill #0
  Filled 2023-04-04: qty 90, 90d supply, fill #1

## 2022-12-27 MED ORDER — AMLODIPINE BESYLATE 10 MG PO TABS
10.0000 mg | ORAL_TABLET | Freq: Every day | ORAL | 1 refills | Status: DC
Start: 2022-12-27 — End: 2023-06-28
  Filled 2022-12-27 – 2023-01-04 (×3): qty 90, 90d supply, fill #0
  Filled 2023-04-04: qty 90, 90d supply, fill #1

## 2022-12-27 NOTE — Progress Notes (Signed)
Subjective:  Patient ID: Jeffery DHALIWAL, male    DOB: 18-Feb-1956  Age: 67 y.o. MRN: [id number]  CC: Hypertension   HPI Jeffery Graves is a 67 y.o. year old male with a history of Adenocarcinoma of the prostate (s/p treatment), Benign prostatic hypertrophy, hypertension, allergic rhinitis who comes into the clinic for a follow up visit   Interval History:  He has redness in his right eye which has been present for several months.  He denies presence of pain or visual health.  He continues to follow-up with his neurologist due to history of prostate cancer.  Lower urinary tract symptoms are controlled on Flomax  PET CT from 11/2022: IMPRESSION: 1. No clear evidence local prostate cancer recurrence in the prostate gland. 2. No evidence metastatic adenopathy in the pelvis or periaortic retroperitoneum. 3. No evidence of visceral metastasis or skeletal metastasis.   Endorses adherence with his antihypertensives. Past Medical History:  Diagnosis Date   BPH (benign prostatic hyperplasia)    Hx of radiation therapy 09/06/10 - 10/29/10   prostate, seminal vesicles   Hypertension    on meds   Prostate cancer ([facility]) 04/26/2010   gleason 7, vol 20.88 cc    Past Surgical History:  Procedure Laterality Date   BIOPSY PROSTATE      Family History  Problem Relation Age of Onset   Stroke Mother    Colon cancer Neg Hx    Colon polyps Neg Hx    Esophageal cancer Neg Hx    Stomach cancer Neg Hx    Rectal cancer Neg Hx     Social History   Socioeconomic History   Marital status: Divorced    Spouse name: Not on file   Number of children: Not on file   Years of education: Not on file   Highest education level: Not on file  Occupational History   Not on file  Tobacco Use   Smoking status: Former   Smokeless tobacco: Never  Vaping Use   Vaping Use: Never used  Substance and Sexual Activity   Alcohol use: Yes    Alcohol/week: 7.0 standard drinks of alcohol    Types: 7 Standard  drinks or equivalent per week   Drug use: No   Sexual activity: Not on file  Other Topics Concern   Not on file  Social History Narrative   Not on file   Social Determinants of Health   Financial Resource Strain: Not on file  Food Insecurity: Not on file  Transportation Needs: Not on file  Physical Activity: Not on file  Stress: Not on file  Social Connections: Not on file    No Known Allergies  Outpatient Medications Prior to Visit  Medication Sig Dispense Refill   amLODipine (NORVASC) 10 MG tablet Take 1 tablet (10 mg total) by mouth daily. 30 tablet 0   lisinopril-hydrochlorothiazide (ZESTORETIC) 20-25 MG tablet TAKE 1 TABLET BY MOUTH ONCE DAILY. (Must have office visit for refills) 30 tablet 0   tamsulosin (FLOMAX) 0.4 MG CAPS capsule Take 1 capsule (0.4 mg total) by mouth daily. 30 capsule 0   sildenafil (VIAGRA) 50 MG tablet TAKE 1 TABLET BY MOUTH ONCE DAILY AS NEEDED FOR ERECTILE DYSFUNCTION. AT LEAST 24 HOURS BETWEEN DOSES 10 tablet 2   No facility-administered medications prior to visit.     ROS Review of Systems  Constitutional:  Negative for activity change and appetite change.  HENT:  Negative for sinus pressure and sore throat.   Respiratory:  Negative for chest tightness, shortness of breath and wheezing.   Cardiovascular:  Negative for chest pain and palpitations.  Gastrointestinal:  Negative for abdominal distention, abdominal pain and constipation.  Genitourinary: Negative.   Musculoskeletal: Negative.   Psychiatric/Behavioral:  Negative for behavioral problems and dysphoric mood.     Objective:  BP 132/72   Pulse 65   Temp 98.5 F (36.9 C) (Oral)   Ht 5\' 6"  (1.676 m)   Wt 195 lb 6.4 oz (88.6 kg)   SpO2 99%   BMI 31.54 kg/m      12/27/2022    9:02 AM 12/27/2022    8:42 AM 06/21/2022    8:43 AM  BP/Weight  Systolic BP 132 149 155  Diastolic BP 72 72 82  Wt. (Lbs)  195.4 197.8  BMI  31.54 kg/m2 31.93 kg/m2      Physical  Exam Constitutional:      Appearance: He is well-developed.  Eyes:     General:        Right eye: No discharge.        Left eye: No discharge.     Comments: Erythematous growth on medial and lateral canthus encroaching on pupil.  Cardiovascular:     Rate and Rhythm: Normal rate.     Heart sounds: Normal heart sounds. No murmur heard. Pulmonary:     Effort: Pulmonary effort is normal.     Breath sounds: Normal breath sounds. No wheezing or rales.  Chest:     Chest wall: No tenderness.  Abdominal:     General: Bowel sounds are normal. There is no distension.     Palpations: Abdomen is soft. There is no mass.     Tenderness: There is no abdominal tenderness.  Musculoskeletal:        General: Normal range of motion.     Right lower leg: No edema.     Left lower leg: No edema.  Neurological:     Mental Status: He is alert and oriented to person, place, and time.  Psychiatric:        Mood and Affect: Mood normal.        Latest Ref Rng & Units 06/21/2022    9:23 AM 12/13/2021    8:42 AM 06/22/2021    9:40 AM  CMP  Glucose 70 - 99 mg/dL 102  85  85   BUN 8 - 27 mg/dL 13  18  12    Creatinine 0.76 - 1.27 mg/dL 7.25  3.66  4.40   Sodium 134 - 144 mmol/L 138  139  135   Potassium 3.5 - 5.2 mmol/L 3.7  3.5  3.5   Chloride 96 - 106 mmol/L 98  99  95   CO2 20 - 29 mmol/L 26  22  23    Calcium 8.6 - 10.2 mg/dL 9.6  9.3  9.6   Total Protein 6.0 - 8.5 g/dL 7.5     Total Bilirubin 0.0 - 1.2 mg/dL 0.8     Alkaline Phos 44 - 121 IU/L 89     AST 0 - 40 IU/L 24     ALT 0 - 44 IU/L 21       Lipid Panel     Component Value Date/Time   CHOL 140 11/05/2018 0848   TRIG 36 11/05/2018 0848   HDL 46 11/05/2018 0848   CHOLHDL 3.0 11/05/2018 0848   CHOLHDL 2.1 11/11/2015 0902   VLDL 7 11/11/2015 0902   LDLCALC 87 11/05/2018 0848  CBC    Component Value Date/Time   WBC 5.0 06/21/2022 0923   WBC 4.3 05/29/2013 1426   RBC 5.28 06/21/2022 0923   RBC 5.32 05/29/2013 1426   HGB 14.2  06/21/2022 0923   HCT 42.8 06/21/2022 0923   PLT 192 06/21/2022 0923   MCV 81 06/21/2022 0923   MCH 26.9 06/21/2022 0923   MCH 26.5 05/29/2013 1426   MCHC 33.2 06/21/2022 0923   MCHC 34.7 05/29/2013 1426   RDW 12.5 06/21/2022 0923   LYMPHSABS 1.3 06/21/2022 0923   MONOABS 0.6 01/27/2010 2219   EOSABS 0.1 06/21/2022 0923   BASOSABS 0.0 06/21/2022 5784    Lab Results  Component Value Date   HGBA1C 5.1 06/21/2022    Assessment & Plan:  1. Essential hypertension Controlled. Continue current regimen Counseled on blood pressure goal of less than 130/80, low-sodium, DASH diet, medication compliance, 150 minutes of moderate intensity exercise per week. Discussed medication compliance, adverse effects. - LP+Non-HDL Cholesterol - CMP14+EGFR - amLODipine (NORVASC) 10 MG tablet; Take 1 tablet (10 mg total) by mouth daily.  Dispense: 90 tablet; Refill: 1 - lisinopril-hydrochlorothiazide (ZESTORETIC) 20-25 MG tablet; Take 1 tablet by mouth daily.  Dispense: 90 tablet; Refill: 1  2. Benign prostatic hyperplasia without lower urinary tract symptoms Controlled - tamsulosin (FLOMAX) 0.4 MG CAPS capsule; Take 1 capsule (0.4 mg total) by mouth daily.  Dispense: 90 capsule; Refill: 1  3. Pterygium of right eye - Ambulatory referral to Ophthalmology  4. History of prostate cancer - PSA, total and free - CBC with Differential/Platelet    Meds ordered this encounter  Medications   amLODipine (NORVASC) 10 MG tablet    Sig: Take 1 tablet (10 mg total) by mouth daily.    Dispense:  90 tablet    Refill:  1   lisinopril-hydrochlorothiazide (ZESTORETIC) 20-25 MG tablet    Sig: Take 1 tablet by mouth daily.    Dispense:  90 tablet    Refill:  1   tamsulosin (FLOMAX) 0.4 MG CAPS capsule    Sig: Take 1 capsule (0.4 mg total) by mouth daily.    Dispense:  90 capsule    Refill:  1    Follow-up: Return in about 6 months (around 06/29/2023) for Chronic medical conditions.       Hoy Register, MD, FAAFP. [facility] [place], [place] [phone]   12/27/2022, 9:04 AM

## 2022-12-27 NOTE — Patient Instructions (Signed)
Pterygium Excision  Pterygium excision is surgery to remove a pterygium, which is a noncancerous (benign) fleshy growth on the front surface of the eye. A pterygium starts on the clear outer tissue of the eye (conjunctiva) and expands onto the clear tissue (cornea) that covers the colored part of the eye (iris). In severe cases, the pterygium may get large enough that it covers the black center of the eye (pupil). You may need this surgery if you have a pterygium that causes discomfort or affects your vision, and other treatments are not working. You may also choose to have this surgery to improve your ability to wear contact lenses or improve the appearance of your eye (cosmetic surgery). Tell a health care provider about: Any allergies you have. All medicines you are taking, including vitamins, herbs, eye drops, creams, and over-the-counter medicines. Any problems you or family members have had with anesthetic medicines. Any bleeding problems you have. Any surgeries you have had, including eye surgery such as photorefractive keratectomy (PRK) or laser-assisted in situ keratomileusis (LASIK). Any medical conditions you have. Whether you are pregnant or may be pregnant. What are the risks? Generally, this is a safe procedure. However, problems may occur, including: The pterygium coming back after surgery, and in rare cases, worse than the original. Eye pain. This is common after surgery but usually goes away after a few weeks. Vision changes, such as blurry vision due to changes in the shape of the eye (astigmatism). A feeling like there is something in your eye. Scarring on the eye (granuloma). What happens before the procedure? When to stop eating and drinking Follow instructions from your health care provider about what you may eat and drink before your procedure. These may include: 8 hours before your procedure Stop eating most foods. Do not eat meat, fried foods, or fatty foods. Eat only  light foods, such as toast or crackers. All liquids are okay except energy drinks and alcohol. 6 hours before your procedure Stop eating. Drink only clear liquids, such as water, clear fruit juice, black coffee, plain tea, and sports drinks. Do not drink energy drinks or alcohol. 2 hours before your procedure Stop drinking all liquids. You may be allowed to take medicines with small sips of water. If you do not follow your health care provider's instructions, your procedure may be delayed or canceled. Medicines Ask your health care provider about: Changing or stopping your regular medicines. This is especially important if you are taking diabetes medicines or blood thinners. Taking medicines such as aspirin and ibuprofen. These medicines can thin your blood. Do not take these medicines unless your health care provider tells you to take them. Taking over-the-counter medicines, vitamins, herbs, and supplements. General instructions If you will be going home right after the procedure, plan to have a responsible adult: Take you home from the hospital or clinic. You will not be allowed to drive. Care for you for the time you are told. What happens during the procedure? An IV may be inserted into one of your veins. You will be given a medicine to numb your eye (local anesthetic). You may also be given a medicine to help you relax (sedative). A device (eyelid speculum) will be placed to hold your eyelids open. The pterygium will be lifted away and removed from your eye. A piece of eye tissue (graft) may be attached to the surface of your eye where the pterygium was removed. This graft may be taken from the conjunctiva in a different area  of your eyeball or amniotic membrane tissue may be used. The graft may be held in place with small stitches that will dissolve over time (absorbable sutures) or with a type of glue, or both. Your eye will be closed and covered with an eye shield. The procedure  may vary among health care providers and hospitals. What happens after the procedure? Your blood pressure, heart rate, breathing rate, and blood oxygen level will be monitored until you leave the hospital or clinic. You will be given pain medicine as needed. You will need to wear your eye shield as told by your health care provider. Do not drive or use machinery until your health care provider approves. Summary A pterygium is a noncancerous (benign) fleshy growth on the front surface of the eye. You may need this surgery if you have a pterygium that causes discomfort or affects your vision. After surgery, your eye will be covered with an eye shield. Wear it as told by your health care provider. This information is not intended to replace advice given to you by your health care provider. Make sure you discuss any questions you have with your health care provider. Document Revised: 03/11/2021 Document Reviewed: 03/11/2021 Elsevier Patient Education  2024 [employer].

## 2022-12-28 LAB — CMP14+EGFR
ALT: 24 IU/L (ref 0–44)
AST: 28 IU/L (ref 0–40)
Albumin/Globulin Ratio: 1.5 (ref 1.2–2.2)
Albumin: 4.5 g/dL (ref 3.9–4.9)
Alkaline Phosphatase: 93 IU/L (ref 44–121)
BUN/Creatinine Ratio: 12 (ref 10–24)
BUN: 13 mg/dL (ref 8–27)
Bilirubin Total: 0.9 mg/dL (ref 0.0–1.2)
CO2: 24 mmol/L (ref 20–29)
Calcium: 9.7 mg/dL (ref 8.6–10.2)
Chloride: 96 mmol/L (ref 96–106)
Creatinine, Ser: 1.08 mg/dL (ref 0.76–1.27)
Globulin, Total: 3.1 g/dL (ref 1.5–4.5)
Glucose: 103 mg/dL — ABNORMAL HIGH (ref 70–99)
Potassium: 3.7 mmol/L (ref 3.5–5.2)
Sodium: 136 mmol/L (ref 134–144)
Total Protein: 7.6 g/dL (ref 6.0–8.5)
eGFR: 76 mL/min/{1.73_m2} (ref >59)

## 2022-12-28 LAB — CBC WITH DIFFERENTIAL/PLATELET
Basophils Absolute: 0 10*3/uL (ref 0.0–0.2)
Basos: 1 %
EOS (ABSOLUTE): 0.1 10*3/uL (ref 0.0–0.4)
Eos: 2 %
Hematocrit: 46.9 % (ref 37.5–51.0)
Hemoglobin: 15.9 g/dL (ref 13.0–17.7)
Immature Grans (Abs): 0 10*3/uL (ref 0.0–0.1)
Immature Granulocytes: 0 %
Lymphocytes Absolute: 1.4 10*3/uL (ref 0.7–3.1)
Lymphs: 28 %
MCH: 27.6 pg (ref 26.6–33.0)
MCHC: 33.9 g/dL (ref 31.5–35.7)
MCV: 81 fL (ref 79–97)
Monocytes Absolute: 0.6 10*3/uL (ref 0.1–0.9)
Monocytes: 13 %
Neutrophils Absolute: 2.8 10*3/uL (ref 1.4–7.0)
Neutrophils: 56 %
Platelets: 201 10*3/uL (ref 150–450)
RBC: 5.77 x10E6/uL (ref 4.14–5.80)
RDW: 14 % (ref 11.6–15.4)
WBC: 4.9 10*3/uL (ref 3.4–10.8)

## 2022-12-28 LAB — PSA, TOTAL AND FREE
PSA, Free Pct: 4.2 %
PSA, Free: 0.42 ng/mL
Prostate Specific Ag, Serum: 10 ng/mL — ABNORMAL HIGH (ref 0.0–4.0)

## 2022-12-28 LAB — LP+NON-HDL CHOLESTEROL
Cholesterol, Total: 199 mg/dL (ref 100–199)
HDL: 87 mg/dL (ref >39)
LDL Chol Calc (NIH): 102 mg/dL — ABNORMAL HIGH (ref 0–99)
Total Non-HDL-Chol (LDL+VLDL): 112 mg/dL (ref 0–129)
Triglycerides: 56 mg/dL (ref 0–149)
VLDL Cholesterol Cal: 10 mg/dL (ref 5–40)

## 2023-01-02 ENCOUNTER — Other Ambulatory Visit: Payer: Self-pay

## 2023-01-04 ENCOUNTER — Other Ambulatory Visit: Payer: Self-pay

## 2023-01-05 ENCOUNTER — Other Ambulatory Visit: Payer: Self-pay

## 2023-01-09 DIAGNOSIS — C61 Malignant neoplasm of prostate: Secondary | ICD-10-CM | POA: Diagnosis not present

## 2023-03-15 ENCOUNTER — Other Ambulatory Visit: Payer: Self-pay | Admitting: Family Medicine

## 2023-03-15 ENCOUNTER — Other Ambulatory Visit: Payer: Self-pay

## 2023-03-15 DIAGNOSIS — N529 Male erectile dysfunction, unspecified: Secondary | ICD-10-CM

## 2023-03-15 MED ORDER — SILDENAFIL CITRATE 50 MG PO TABS
50.0000 mg | ORAL_TABLET | Freq: Every day | ORAL | 0 refills | Status: DC
Start: 2023-03-15 — End: 2023-06-28
  Filled 2023-03-15: qty 10, 10d supply, fill #0

## 2023-03-16 ENCOUNTER — Other Ambulatory Visit: Payer: Self-pay

## 2023-03-17 ENCOUNTER — Other Ambulatory Visit: Payer: Self-pay

## 2023-04-04 ENCOUNTER — Other Ambulatory Visit: Payer: Self-pay

## 2023-04-05 ENCOUNTER — Other Ambulatory Visit: Payer: Self-pay

## 2023-05-25 ENCOUNTER — Encounter (HOSPITAL_COMMUNITY): Payer: Self-pay | Admitting: Emergency Medicine

## 2023-05-25 ENCOUNTER — Ambulatory Visit (HOSPITAL_COMMUNITY)
Admission: EM | Admit: 2023-05-25 | Discharge: 2023-05-25 | Disposition: A | Discharge status: Home / Self Care | Payer: 59 | Attending: Family Medicine | Admitting: Family Medicine

## 2023-05-25 DIAGNOSIS — N400 Enlarged prostate without lower urinary tract symptoms: Secondary | ICD-10-CM | POA: Insufficient documentation

## 2023-05-25 DIAGNOSIS — R300 Dysuria: Secondary | ICD-10-CM | POA: Insufficient documentation

## 2023-05-25 LAB — POCT URINALYSIS DIP (MANUAL ENTRY)
Bilirubin, UA: NEGATIVE
Glucose, UA: NEGATIVE mg/dL
Ketones, POC UA: NEGATIVE mg/dL
Nitrite, UA: NEGATIVE
Protein Ur, POC: NEGATIVE mg/dL
Spec Grav, UA: 1.02 (ref 1.010–1.025)
Urobilinogen, UA: 1 U/dL
pH, UA: 7 (ref 5.0–8.0)

## 2023-05-25 LAB — HIV ANTIBODY (ROUTINE TESTING W REFLEX): HIV Screen 4th Generation wRfx: NONREACTIVE

## 2023-05-25 LAB — GC/CHLAMYDIA PROBE AMP (~~LOC~~) NOT AT ARMC
Chlamydia: NEGATIVE
Comment: NEGATIVE
Comment: NORMAL
Neisseria Gonorrhea: NEGATIVE

## 2023-05-25 NOTE — ED Triage Notes (Signed)
Pt c/o difficulty urinating and burning when urinating that started 1 week ago

## 2023-05-25 NOTE — ED Provider Notes (Signed)
[facility]    CSN: [id number] Arrival date & time: 05/25/23  5188      History   Chief Complaint Chief Complaint  Patient presents with   Urinary Retention    HPI Jeffery Graves is a 67 y.o. male.   The patient is a 67 year old male presenting with 1 week of burning when pees and difficulty initiating urination.  He has not had any urethral discharge, blood in the urine, or purulence.  He has not had any flank pain or difficulty with his stream once urination is initiated.  He does have a new sexual partner which he has not used barrier protection with consistently.  He denies any rashes,, joint pain, chest pain, palpitations, difficulty breathing, fever, chills, neck stiffness, testicular swelling, or pain in the testicles.  He denies any external lesions or bumps, or swelling of lymph nodes.  He does note having gonorrhea in the past and this feels the same.     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 Authorizing Provider  amLODipine (NORVASC) 10 MG tablet Take 1 tablet (10 mg total) by mouth daily. 12/27/22   Hoy Register, MD  lisinopril-hydrochlorothiazide (ZESTORETIC) 20-25 MG tablet Take 1 tablet by mouth daily. 12/27/22   Hoy Register, MD  sildenafil (VIAGRA) 50 MG tablet TAKE 1 TABLET BY MOUTH ONCE DAILY AS  NEEDED FOR ERECTILE DYSFUNCTION. AT LEAST 24 HOURS BETWEEN DOSES 03/15/23   Hoy Register, MD  tamsulosin (FLOMAX) 0.4 MG CAPS capsule Take 1 capsule (0.4 mg total) by mouth daily. 12/27/22   Hoy Register, MD    Family History Family History  Problem Relation Age of Onset   Stroke Mother    Colon cancer Neg Hx    Colon polyps Neg Hx    Esophageal cancer Neg Hx    Stomach cancer Neg Hx    Rectal cancer Neg Hx     Social History Social History   Tobacco Use   Smoking status: Former   Smokeless tobacco: Never  Vaping Use   Vaping status: Never Used  Substance Use Topics   Alcohol use: Yes    Alcohol/week: 7.0 standard drinks of alcohol    Types: 7 Standard drinks or equivalent per week   Drug use: No     Allergies   Patient has no known allergies.   Review of Systems Review of Systems  Constitutional:  Negative for chills and fever.  HENT:  Negative for congestion.   Respiratory:  Negative for cough and shortness of breath.   Cardiovascular:  Negative for chest pain and palpitations.  Gastrointestinal:  Negative for abdominal pain, blood in stool, constipation, diarrhea, nausea and vomiting.  Endocrine: Negative for polyuria.  Genitourinary:  Positive for difficulty urinating (With initiation) and dysuria. Negative for  decreased urine volume, enuresis, flank pain, frequency, genital sores, hematuria, penile discharge, penile pain, penile swelling, scrotal swelling, testicular pain and urgency.  Musculoskeletal:  Negative for arthralgias, joint swelling and myalgias.  Skin:  Negative for rash.  Allergic/Immunologic: Negative for immunocompromised state.     Physical Exam Triage Vital Signs ED Triage Vitals  Encounter Vitals Group     BP 05/25/23 0913 (!) 143/88     Systolic BP Percentile --      Diastolic BP Percentile --      Pulse Rate 05/25/23 0913 100     Resp 05/25/23 0913 16     Temp 05/25/23 0913 98.2 F (36.8 C)     Temp Source 05/25/23 0913 Oral      SpO2 05/25/23 0913 98 %     Weight --      Height --      Head Circumference --      Peak Flow --      Pain Score 05/25/23 0911 10     Pain Loc --      Pain Education --      Exclude from Growth Chart --    No data found.  Updated Vital Signs BP (!) 143/88 (BP Location: Right Arm)   Pulse 100   Temp 98.2 F (36.8 C) (Oral)   Resp 16   SpO2 98%   Visual Acuity Right Eye Distance:   Left Eye Distance:   Bilateral Distance:    Right Eye Near:   Left Eye Near:    Bilateral Near:     Physical Exam Vitals reviewed.  Constitutional:      General: He is not in acute distress.    Appearance: Normal appearance. He is normal weight. He is not ill-appearing or toxic-appearing.  HENT:     Head: Normocephalic and atraumatic.     Nose: Nose normal.  Eyes:     Extraocular Movements: Extraocular movements intact.     Pupils: Pupils are equal, round, and reactive to light.  Cardiovascular:     Rate and Rhythm: Normal rate and regular rhythm.  Pulmonary:     Effort: Pulmonary effort is normal.     Breath sounds: Normal breath sounds.  Abdominal:     General: Abdomen is flat. Bowel sounds are normal.     Tenderness: There is no abdominal tenderness. There is no right CVA tenderness, left CVA tenderness or guarding.  Genitourinary:    Penis: Normal.      Testes: Normal.     Comments: Epididimus non tender to palpation with no nodularity.  Inguinal lymphnodes palpable on the R but non tender No external lesions or discharge Skin:    General: Skin is warm.     Capillary Refill: Capillary refill takes 2 to 3 seconds.     Coloration: Skin is not jaundiced.     Findings: No lesion or rash.  Neurological:     General: No focal deficit present.     Mental Status: He is alert. Mental status is at baseline.  Psychiatric:        Mood and Affect: Mood normal.        Behavior: Behavior normal.      UC Treatments / Results  Labs (all labs ordered are listed, but only abnormal  results are displayed) Labs Reviewed  POCT URINALYSIS DIP (MANUAL ENTRY) - Abnormal; Notable for the following components:      Result Value   Blood, UA trace-intact (*)    Leukocytes,  UA Trace (*)    All other components within normal limits  HIV ANTIBODY (ROUTINE TESTING W REFLEX)  RPR  GC/CHLAMYDIA PROBE AMP ([place]) NOT AT [facility]    EKG   Radiology No results found.  Procedures Procedures (including critical care time)  Medications Ordered in UC Medications - No data to display  Initial Impression / Assessment and Plan / UC Course  I have reviewed the triage vital signs and the nursing notes.  Pertinent labs & imaging results that were available during my care of the patient were reviewed by me and considered in my medical decision making (see chart for details).     Dysuria - Ddx: passed renal calculi vs STI vs BPH - The patient is not having any discharge but has some trace blood and WBCs on urine. Otherwise non infectious. No external lesions but he states it does feel like previous STI.  - HIV, RPR, GC/Clam pending  - Given then non infectious presentation and no discharge we will hold off on Abx for now until cultures result - Further signs/sxs discussed w/ pt to monitor for. If he develops discharge he can call and we will send in Abx.  - Take Tylenol or Ibuprofen for pain - We will start flomax daily for 10 days and increase oral hydration.  - I recommended follow up with PCP. As the sxs are sudden onset it is less likely just BPH but pt will monitor sxs.  - Pt voiced understanding and agreement with plan.    Final Clinical Impressions(s) / UC Diagnoses   Final diagnoses:  Dysuria     Discharge Instructions      Your pain may be due to a kidney stone or an infectious process. If you start to have urethral discharge call us and we will send in antibiotics.  If your cultures come back positive we will send in antibiotics as well. We will follow-up  with your blood work results Continue with good oral hydration and Flomax for the next 10 days If you develop any rashes, ulcers of the penis or testicles, return to urgent care or your PCP. Refrain from sexual activity until your lab results come in.     ED Prescriptions   None    PDMP not reviewed this encounter.   Ivor Messier, MD 05/25/23 [phone]

## 2023-05-25 NOTE — Discharge Instructions (Signed)
Your pain may be due to a kidney stone or an infectious process. If you start to have urethral discharge call us and we will send in antibiotics.  If your cultures come back positive we will send in antibiotics as well. We will follow-up with your blood work results Continue with good oral hydration and Flomax for the next 10 days If you develop any rashes, ulcers of the penis or testicles, return to urgent care or your PCP. Refrain from sexual activity until your lab results come in.

## 2023-05-26 ENCOUNTER — Other Ambulatory Visit: Payer: Self-pay

## 2023-05-26 ENCOUNTER — Other Ambulatory Visit: Payer: Self-pay | Admitting: Family Medicine

## 2023-05-26 DIAGNOSIS — N400 Enlarged prostate without lower urinary tract symptoms: Secondary | ICD-10-CM

## 2023-05-26 LAB — RPR: RPR Ser Ql: NONREACTIVE

## 2023-05-26 MED ORDER — TAMSULOSIN HCL 0.4 MG PO CAPS
0.4000 mg | ORAL_CAPSULE | Freq: Every day | ORAL | 0 refills | Status: DC
  Filled 2023-05-26 – 2023-10-19 (×2): qty 10, 10d supply, fill #0

## 2023-05-31 ENCOUNTER — Emergency Department (HOSPITAL_COMMUNITY)
Admission: EM | Admit: 2023-05-31 | Discharge: 2023-06-01 | Disposition: A | Discharge status: Home / Self Care | Payer: 59 | Attending: Emergency Medicine | Admitting: Emergency Medicine

## 2023-05-31 ENCOUNTER — Encounter (HOSPITAL_COMMUNITY): Payer: Self-pay

## 2023-05-31 DIAGNOSIS — R339 Retention of urine, unspecified: Secondary | ICD-10-CM | POA: Diagnosis present

## 2023-05-31 LAB — CBC
HCT: 45.2 % (ref 39.0–52.0)
Hemoglobin: 14.7 g/dL (ref 13.0–17.0)
MCH: 27.2 pg (ref 26.0–34.0)
MCHC: 32.5 g/dL (ref 30.0–36.0)
MCV: 83.5 fL (ref 80.0–100.0)
Platelets: 214 10*3/uL (ref 150–400)
RBC: 5.41 MIL/uL (ref 4.22–5.81)
RDW: 13.4 % (ref 11.5–15.5)
WBC: 5.1 10*3/uL (ref 4.0–10.5)
nRBC: 0 % (ref 0.0–0.2)

## 2023-05-31 LAB — URINALYSIS, ROUTINE W REFLEX MICROSCOPIC
Bacteria, UA: NONE SEEN
Bilirubin Urine: NEGATIVE
Glucose, UA: NEGATIVE mg/dL
Ketones, ur: NEGATIVE mg/dL
Leukocytes,Ua: NEGATIVE
Nitrite: NEGATIVE
Protein, ur: NEGATIVE mg/dL
Specific Gravity, Urine: 1.004 — ABNORMAL LOW (ref 1.005–1.030)
pH: 6 (ref 5.0–8.0)

## 2023-05-31 LAB — BASIC METABOLIC PANEL
Anion gap: 10 (ref 5–15)
BUN: 12 mg/dL (ref 8–23)
CO2: 25 mmol/L (ref 22–32)
Calcium: 9.4 mg/dL (ref 8.9–10.3)
Chloride: 103 mmol/L (ref 98–111)
Creatinine, Ser: 1.09 mg/dL (ref 0.61–1.24)
GFR, Estimated: >60 mL/min (ref >60)
Glucose, Bld: 114 mg/dL — ABNORMAL HIGH (ref 70–99)
Potassium: 3.3 mmol/L — ABNORMAL LOW (ref 3.5–5.1)
Sodium: 138 mmol/L (ref 135–145)

## 2023-05-31 NOTE — ED Triage Notes (Signed)
Pt was seen on the 24th for difficulty urinating at the [facility] and they ran labs/urine on him for his reports. He did not mention any interventions done but he is presenting today with some similar symptoms. He was able to urinate before coming to triage and states that relieved some of his pain. He does have a Hx of BPH. He does not express any urination pain other than his bladder feeling full and no hematuria. He is otherwise stable with no other complaints.

## 2023-05-31 NOTE — ED Notes (Addendum)
Bladder scan 323 ml pvr

## 2023-06-01 NOTE — Discharge Instructions (Addendum)
Sometimes with a large prostate, it can be difficulty to urinate.  Please follow-up with your urologist, for further management.  Your blood work and urine were reassuring, and you are able to void, without any kind of retention at last, this is reassuring.  If you are unable to void after 6 hours, and feel like you have to urinate have severe abdominal pain return to the ER immediately.  Please make sure you are taking your Flomax as prescribed.  Additionally your heart rate was high today, but we watched it, and it went down and is normal now.  Please follow-up with your primary care doctor, and let them know that your heart rate spiked while you are in the ER, and were referred back to them for further evaluation you may need a heart monitor at home

## 2023-06-01 NOTE — ED Provider Notes (Signed)
[place] EMERGENCY DEPARTMENT AT [facility] Provider Note   CSN: [id number] Arrival date & time: 05/31/23  2228     History  Chief Complaint  Patient presents with   Urinary Retention    Jeffery Graves is a 67 y.o. male, history of enlarged prostate, who presents to the ED secondary to difficulty urinating, has been intermittently going on for the last week.  He states tonight he laid down, and felt like he had to urinate, but could not.  So he decided to come to the ER to get checked out.  He denies any abdominal pain, nausea, vomiting, pain with urination.  States he got checked for STDs last week, and it was negative.  Denies any penile trauma.  No back pain or fevers.  Has been compliant with flomax.   Home Medications Prior to Admission medications   Medication Sig Start Date End Date Taking? Authorizing Provider  amLODipine (NORVASC) 10 MG tablet Take 1 tablet (10 mg total) by mouth daily. 12/27/22   Hoy Register, MD  lisinopril-hydrochlorothiazide (ZESTORETIC) 20-25 MG tablet Take 1 tablet by mouth daily. 12/27/22   Hoy Register, MD  sildenafil (VIAGRA) 50 MG tablet TAKE 1 TABLET BY MOUTH ONCE DAILY AS NEEDED FOR ERECTILE DYSFUNCTION. AT LEAST 24 HOURS BETWEEN DOSES 03/15/23   Hoy Register, MD  tamsulosin (FLOMAX) 0.4 MG CAPS capsule Take 1 capsule (0.4 mg total) by mouth daily. 05/25/23   Ivor Messier, MD      Allergies    Patient has no known allergies.    Review of Systems   Review of Systems  Constitutional:  Negative for fever.  Genitourinary:  Positive for difficulty urinating.    Physical Exam Updated Vital Signs BP (!) 148/75   Pulse 64   Temp 98.5 F (36.9 C) (Oral)   Resp 17   SpO2 100%  Physical Exam Vitals and nursing note reviewed.  Constitutional:      General: He is not in acute distress.    Appearance: He is well-developed.  HENT:     Head: Normocephalic and atraumatic.  Eyes:     Conjunctiva/sclera: Conjunctivae  normal.  Cardiovascular:     Rate and Rhythm: Normal rate and regular rhythm.     Heart sounds: No murmur heard. Pulmonary:     Effort: Pulmonary effort is normal. No respiratory distress.     Breath sounds: Normal breath sounds.  Abdominal:     Palpations: Abdomen is soft.     Tenderness: There is no abdominal tenderness.  Musculoskeletal:        General: No swelling.     Cervical back: Neck supple.  Skin:    General: Skin is warm and dry.     Capillary Refill: Capillary refill takes less than 2 seconds.  Neurological:     Mental Status: He is alert.  Psychiatric:        Mood and Affect: Mood normal.     ED Results / Procedures / Treatments   Labs (all labs ordered are listed, but only abnormal results are displayed) Labs Reviewed  URINALYSIS, ROUTINE W REFLEX MICROSCOPIC - Abnormal; Notable for the following components:      Result Value   Color, Urine STRAW (*)    Specific Gravity, Urine 1.004 (*)    Hgb urine dipstick Elizebeth Kluesner (*)    All other components within normal limits  BASIC METABOLIC PANEL - Abnormal; Notable for the following components:   Potassium 3.3 (*)  Glucose, Bld 114 (*)    All other components within normal limits  CBC    EKG EKG Interpretation Date/Time:  Thursday June 01 2023 04:12:54 EDT Ventricular Rate:  64 PR Interval:  190 QRS Duration:  94 QT Interval:  395 QTC Calculation: 408 R Axis:   11  Text Interpretation: Sinus rhythm Multiple ventricular premature complexes Left atrial enlargement Anterior infarct, old ST elevation, consider inferior injury Confirmed by Kennis Carina [phone]) on 06/01/2023 4:22:07 AM  Radiology No results found.  Procedures Procedures    Medications Ordered in ED Medications - No data to display  ED Course/ Medical Decision Making/ A&P                                 Medical Decision Making Patient is a 67 year old male, here for difficulty urinating, that started today, had similar episode last  week.  Is known to have enlarged prostate, has been compliant with his prostate meds.  States recently tested for gonorrhea and chlamydia which were negative.  We will BladderScan patient, do eval for retention, as well as obtain a CBC, BMP, and urinalysis.  He was in the lobby, but on recheck of his vitals, his heart rate was 130 per the tach.  Thus he was taken back.  He denies any chest pain, shortness of breath, known history of any kind of heart issues.  EKG, shows normal sinus rhythm, with PVCs, we will observe  Amount and/or Complexity of Data Reviewed Labs: ordered.    Details: Unremarkable Discussion of management or test interpretation with external provider(s): Discussed with patient, I had him do a postvoid residual, which was 0, he is no longer retaining, earlier he had a postvoid residual of 300. He was observed for over an hour, and there was no evidence of any kind of tachycardia, arrhythmia.  I reviewed his EKG, which showed normal sinus rhythm with PVCs.  He was encouraged to follow-up with his primary care doctor, for possible heart monitor outpatient, and return if he has any chest pain, shortness of breath, or palpitations.  He has no leg swelling, or arm swelling to suggest a DVT.  I believe that his difficulty urinating is likely secondary to his enlarged prostate, and he has been compliant with his Flomax, he has an appointment to see his urologist soon.  We discussed return precautions and he voiced understanding discharged home is not having any pain on my exam, thus a CT was not ordered to check for any kind of obstruction, in addition he had a normal postvoid residual.   Final Clinical Impression(s) / ED Diagnoses Final diagnoses:  Urinary retention    Rx / DC Orders ED Discharge Orders     None         Samule Life, Harley [place], PA 06/01/23 0617    Sabas Sous, MD 06/01/23 [phone]

## 2023-06-01 NOTE — ED Notes (Signed)
Pt's HR was 130 on dynamap and 65 palpated, advised triage RN and triage RN assisted in getting pt a room

## 2023-06-14 ENCOUNTER — Ambulatory Visit: Payer: 59

## 2023-06-20 ENCOUNTER — Other Ambulatory Visit: Payer: Self-pay

## 2023-06-20 MED ORDER — TAMSULOSIN HCL 0.4 MG PO CAPS
0.4000 mg | ORAL_CAPSULE | Freq: Two times a day (BID) | ORAL | 11 refills | Status: DC
  Filled 2023-06-20: qty 60, 30d supply, fill #0
  Filled 2023-08-17: qty 60, 30d supply, fill #1
  Filled 2023-09-18: qty 60, 30d supply, fill #2
  Filled 2023-10-19: qty 60, 30d supply, fill #3
  Filled 2023-11-15: qty 60, 30d supply, fill #4
  Filled 2023-12-19: qty 60, 30d supply, fill #5
  Filled 2024-01-12: qty 60, 30d supply, fill #6
  Filled 2024-02-12: qty 60, 30d supply, fill #7
  Filled 2024-03-12: qty 60, 30d supply, fill #8
  Filled 2024-04-12: qty 60, 30d supply, fill #9
  Filled 2024-05-13: qty 60, 30d supply, fill #10
  Filled 2024-06-11: qty 60, 30d supply, fill #11

## 2023-06-21 ENCOUNTER — Other Ambulatory Visit: Payer: Self-pay

## 2023-06-21 ENCOUNTER — Other Ambulatory Visit (HOSPITAL_BASED_OUTPATIENT_CLINIC_OR_DEPARTMENT_OTHER): Payer: 59 | Admitting: Pharmacist

## 2023-06-21 ENCOUNTER — Encounter: Payer: Self-pay | Admitting: Pharmacist

## 2023-06-21 DIAGNOSIS — I10 Essential (primary) hypertension: Secondary | ICD-10-CM

## 2023-06-21 MED ORDER — LISINOPRIL-HYDROCHLOROTHIAZIDE 20-25 MG PO TABS
1.0000 | ORAL_TABLET | Freq: Every day | ORAL | 0 refills | Status: DC
  Filled 2023-06-21 – 2023-07-04 (×2): qty 90, 90d supply, fill #0

## 2023-06-21 NOTE — Progress Notes (Signed)
Pharmacy Quality Measure Review  This patient is appearing on a report for being at risk of failing the adherence measure for hypertension (ACEi/ARB) medications this calendar year.   Medication: lisinopril-hydrochlorothiazide Last fill date: 04/05/2023 for 90 day supply. Current rxn out of refills. New rxn sent to patient's pharmacy. Of note, absolute fail date of 05/15/2023 listed on this report is based off of a last fill date of 01/04/2023. Insurance report was not up to date. No action needed at this time.   Butch Penny, PharmD, Patsy [place], CPP Clinical Pharmacist [facility] [phone]

## 2023-06-27 ENCOUNTER — Other Ambulatory Visit: Payer: Self-pay

## 2023-06-28 ENCOUNTER — Ambulatory Visit: Payer: 59 | Attending: Family Medicine

## 2023-06-28 ENCOUNTER — Other Ambulatory Visit: Payer: Self-pay | Admitting: Family Medicine

## 2023-06-28 ENCOUNTER — Other Ambulatory Visit: Payer: Self-pay

## 2023-06-28 DIAGNOSIS — N400 Enlarged prostate without lower urinary tract symptoms: Secondary | ICD-10-CM

## 2023-06-28 DIAGNOSIS — I10 Essential (primary) hypertension: Secondary | ICD-10-CM

## 2023-06-28 DIAGNOSIS — N529 Male erectile dysfunction, unspecified: Secondary | ICD-10-CM

## 2023-06-28 MED ORDER — SILDENAFIL CITRATE 50 MG PO TABS
50.0000 mg | ORAL_TABLET | Freq: Every day | ORAL | 0 refills | Status: DC
  Filled 2023-06-28: qty 10, 10d supply, fill #0

## 2023-06-28 MED ORDER — AMLODIPINE BESYLATE 10 MG PO TABS
10.0000 mg | ORAL_TABLET | Freq: Every day | ORAL | 0 refills | Status: DC
Start: 2023-06-28 — End: 2023-08-14
  Filled 2023-06-28: qty 90, 90d supply, fill #0

## 2023-06-29 LAB — PSA, TOTAL AND FREE
PSA, Free Pct: 5.3 %
PSA, Free: 0.46 ng/mL
Prostate Specific Ag, Serum: 8.6 ng/mL — ABNORMAL HIGH (ref 0.0–4.0)

## 2023-07-04 ENCOUNTER — Other Ambulatory Visit: Payer: Self-pay

## 2023-07-06 ENCOUNTER — Other Ambulatory Visit: Payer: Self-pay

## 2023-07-13 ENCOUNTER — Other Ambulatory Visit: Payer: Self-pay

## 2023-07-13 MED ORDER — SULFAMETHOXAZOLE-TRIMETHOPRIM 800-160 MG PO TABS
1.0000 | ORAL_TABLET | Freq: Two times a day (BID) | ORAL | 0 refills | Status: DC
  Filled 2023-07-13 – 2023-10-02 (×2): qty 2, 1d supply, fill #0

## 2023-07-24 ENCOUNTER — Other Ambulatory Visit: Payer: Self-pay

## 2023-08-14 ENCOUNTER — Ambulatory Visit: Payer: 59 | Attending: Family Medicine | Admitting: Family Medicine

## 2023-08-14 ENCOUNTER — Encounter: Payer: Self-pay | Admitting: Family Medicine

## 2023-08-14 ENCOUNTER — Other Ambulatory Visit: Payer: Self-pay

## 2023-08-14 VITALS — BP 132/74 | HR 91 | Ht 66.0 in | Wt 184.0 lb

## 2023-08-14 DIAGNOSIS — E876 Hypokalemia: Secondary | ICD-10-CM

## 2023-08-14 DIAGNOSIS — Z8546 Personal history of malignant neoplasm of prostate: Secondary | ICD-10-CM | POA: Diagnosis not present

## 2023-08-14 DIAGNOSIS — N400 Enlarged prostate without lower urinary tract symptoms: Secondary | ICD-10-CM

## 2023-08-14 DIAGNOSIS — I10 Essential (primary) hypertension: Secondary | ICD-10-CM | POA: Diagnosis not present

## 2023-08-14 MED ORDER — LISINOPRIL-HYDROCHLOROTHIAZIDE 20-25 MG PO TABS
1.0000 | ORAL_TABLET | Freq: Every day | ORAL | 0 refills | Status: DC
  Filled 2023-08-14 – 2023-09-18 (×2): qty 90, 90d supply, fill #0

## 2023-08-14 MED ORDER — AMLODIPINE BESYLATE 10 MG PO TABS
10.0000 mg | ORAL_TABLET | Freq: Every day | ORAL | 1 refills | Status: DC
  Filled 2023-08-14 – 2023-09-18 (×2): qty 90, 90d supply, fill #0
  Filled 2023-12-19: qty 90, 90d supply, fill #1

## 2023-08-14 NOTE — Progress Notes (Signed)
 Subjective:  Patient ID: Jeffery Graves, male    DOB: 1956/06/21  Age: 68 y.o. MRN: [id number]  CC: Medical Management of Chronic Issues   HPI Jeffery Graves is a 68 y.o. year old male with a history of Adenocarcinoma of the prostate (s/p treatment), Benign prostatic hypertrophy, hypertension, allergic rhinitis who comes into the clinic for a follow up visit   Interval History: Discussed the use of AI scribe software for clinical note transcription with the patient, who gave verbal consent to proceed.  The patient presents with confusion regarding his Flomax . Jeffery Graves believes Jeffery Graves is supposed to be taking two doses a day, but when Jeffery Graves received his refill, Jeffery Graves was only given enough for one dose a day for two months. Jeffery Graves is unsure if the prescribing doctor, Dr. Janet, made an error or if there was a miscommunication with the pharmacy. The patient has been taking only one dose a day to avoid running out of medication prematurely.  Med list reveals Jeffery Graves was given number 60 pills to be taken twice daily with 11 refills. The patient also mentions a possible biopsy to follow-up on his prostate cancer.  Last PSA was 8.6 in 11/2 27/24.     In addition, the patient is on medication for blood pressure, including amlodipine  and lisinopril -hydrochlorothiazide . Jeffery Graves has been receiving a 90-day supply of these medications.   Past Medical History:  Diagnosis Date   BPH (benign prostatic hyperplasia)    Hx of radiation therapy 09/06/10 - 10/29/10   prostate, seminal vesicles   Hypertension    on meds   Prostate cancer (HCC) 04/26/2010   gleason 7, vol 20.88 cc    Past Surgical History:  Procedure Laterality Date   BIOPSY PROSTATE      Family History  Problem Relation Age of Onset   Stroke Mother    Colon cancer Neg Hx    Colon polyps Neg Hx    Esophageal cancer Neg Hx    Stomach cancer Neg Hx    Rectal cancer Neg Hx     Social History   Socioeconomic History   Marital status: Divorced    Spouse name:  Not on file   Number of children: Not on file   Years of education: Not on file   Highest education level: Not on file  Occupational History   Not on file  Tobacco Use   Smoking status: Former   Smokeless tobacco: Never  Vaping Use   Vaping status: Never Used  Substance and Sexual Activity   Alcohol use: Yes    Alcohol/week: 7.0 standard drinks of alcohol    Types: 7 Standard drinks or equivalent per week   Drug use: No   Sexual activity: Not on file  Other Topics Concern   Not on file  Social History Narrative   Not on file   Social Drivers of Health   Financial Resource Strain: Not on file  Food Insecurity: Not on file  Transportation Needs: Not on file  Physical Activity: Not on file  Stress: Not on file  Social Connections: Not on file    No Known Allergies  Outpatient Medications Prior to Visit  Medication Sig Dispense Refill   sildenafil  (VIAGRA ) 50 MG tablet TAKE 1 TABLET BY MOUTH ONCE DAILY AS NEEDED FOR ERECTILE DYSFUNCTION. AT LEAST 24 HOURS BETWEEN DOSES 10 tablet 0   tamsulosin  (FLOMAX ) 0.4 MG CAPS capsule Take 1 capsule (0.4 mg total) by mouth 2 (two) times daily. 60 capsule 11  amLODipine  (NORVASC ) 10 MG tablet Take 1 tablet (10 mg total) by mouth daily. 90 tablet 0   lisinopril -hydrochlorothiazide  (ZESTORETIC ) 20-25 MG tablet Take 1 tablet by mouth daily. 90 tablet 0   sulfamethoxazole -trimethoprim  (BACTRIM  DS) 800-160 MG tablet Take 1 tablet by mouth 2 (two) times daily. Begin morning of prostate biopsy (Patient not taking: Reported on 08/14/2023) 2 tablet 0   tamsulosin  (FLOMAX ) 0.4 MG CAPS capsule Take 1 capsule (0.4 mg total) by mouth daily. (Patient not taking: Reported on 08/14/2023) 10 capsule 0   No facility-administered medications prior to visit.     ROS Review of Systems  Constitutional:  Negative for activity change and appetite change.  HENT:  Negative for sinus pressure and sore throat.   Respiratory:  Negative for chest tightness,  shortness of breath and wheezing.   Cardiovascular:  Negative for chest pain and palpitations.  Gastrointestinal:  Negative for abdominal distention, abdominal pain and constipation.  Genitourinary: Negative.   Musculoskeletal: Negative.   Psychiatric/Behavioral:  Negative for behavioral problems and dysphoric mood.     Objective:  BP 132/74   Pulse 91   Ht 5' 6 (1.676 m)   Wt 184 lb (83.5 kg)   SpO2 100%   BMI 29.70 kg/m      08/14/2023    8:42 AM 06/01/2023    5:30 AM 06/01/2023    5:15 AM  BP/Weight  Systolic BP 132 148 143  Diastolic BP 74 75 77  Wt. (Lbs) 184    BMI 29.7 kg/m2        Physical Exam Constitutional:      Appearance: Jeffery Graves is well-developed.  Cardiovascular:     Rate and Rhythm: Normal rate.     Heart sounds: Normal heart sounds. No murmur heard. Pulmonary:     Effort: Pulmonary effort is normal.     Breath sounds: Normal breath sounds. No wheezing or rales.  Chest:     Chest wall: No tenderness.  Abdominal:     General: Bowel sounds are normal. There is no distension.     Palpations: Abdomen is soft. There is no mass.     Tenderness: There is no abdominal tenderness.  Musculoskeletal:        General: Normal range of motion.     Right lower leg: No edema.     Left lower leg: No edema.  Neurological:     Mental Status: Jeffery Graves is alert and oriented to person, place, and time.  Psychiatric:        Mood and Affect: Mood normal.        Latest Ref Rng & Units 05/31/2023   11:00 PM 12/27/2022    9:20 AM 06/21/2022    9:23 AM  CMP  Glucose 70 - 99 mg/dL 885  896  898   BUN 8 - 23 mg/dL 12  13  13    Creatinine 0.61 - 1.24 mg/dL 8.90  8.91  8.95   Sodium 135 - 145 mmol/L 138  136  138   Potassium 3.5 - 5.1 mmol/L 3.3  3.7  3.7   Chloride 98 - 111 mmol/L 103  96  98   CO2 22 - 32 mmol/L 25  24  26    Calcium  8.9 - 10.3 mg/dL 9.4  9.7  9.6   Total Protein 6.0 - 8.5 g/dL  7.6  7.5   Total Bilirubin 0.0 - 1.2 mg/dL  0.9  0.8   Alkaline Phos 44 - 121  IU/L  93  89  AST 0 - 40 IU/L  28  24   ALT 0 - 44 IU/L  24  21     Lipid Panel     Component Value Date/Time   CHOL 199 12/27/2022 0920   TRIG 56 12/27/2022 0920   HDL 87 12/27/2022 0920   CHOLHDL 3.0 11/05/2018 0848   CHOLHDL 2.1 11/11/2015 0902   VLDL 7 11/11/2015 0902   LDLCALC 102 (H) 12/27/2022 0920    CBC    Component Value Date/Time   WBC 5.1 05/31/2023 2300   RBC 5.41 05/31/2023 2300   HGB 14.7 05/31/2023 2300   HGB 15.9 12/27/2022 0920   HCT 45.2 05/31/2023 2300   HCT 46.9 12/27/2022 0920   PLT 214 05/31/2023 2300   PLT 201 12/27/2022 0920   MCV 83.5 05/31/2023 2300   MCV 81 12/27/2022 0920   MCH 27.2 05/31/2023 2300   MCHC 32.5 05/31/2023 2300   RDW 13.4 05/31/2023 2300   RDW 14.0 12/27/2022 0920   LYMPHSABS 1.4 12/27/2022 0920   MONOABS 0.6 01/27/2010 2219   EOSABS 0.1 12/27/2022 0920   BASOSABS 0.0 12/27/2022 0920    Lab Results  Component Value Date   HGBA1C 5.1 06/21/2022    Assessment & Plan:      History of Prostate Cancer Patient is on Flomax  but there is confusion about the dosing. The patient has been taking one pill a day, but believes Jeffery Graves should be taking two -Advised Jeffery Graves did receive a 30-day supply enough to allow him to take 2 tabs of Flomax . -Increase medication to two pills a day as per urologist's prescription. -Encourage patient to contact urologist if medication runs out. -PSA today -Keep appointment for prostate biopsy  Hypertension Well controlled on current medication regimen. -Continue current blood pressure medication.  BPH -Continue Flomax     General Health Maintenance Patient declined flu shot. -No changes to current plan.  Hypokalemia Previous test showed low potassium levels. -Order basic metabolic panel to check current potassium levels.          Meds ordered this encounter  Medications   amLODipine  (NORVASC ) 10 MG tablet    Sig: Take 1 tablet (10 mg total) by mouth daily.    Dispense:  90 tablet     Refill:  1   lisinopril -hydrochlorothiazide  (ZESTORETIC ) 20-25 MG tablet    Sig: Take 1 tablet by mouth daily.    Dispense:  90 tablet    Refill:  0    Follow-up: Return in about 6 months (around 02/11/2024) for Chronic medical conditions.       Corrina Sabin, MD, FAAFP. [facility] [place], [place] [phone]   08/14/2023, 8:59 AM

## 2023-08-14 NOTE — Patient Instructions (Signed)
 VISIT SUMMARY:  During today's visit, we discussed your confusion regarding the dosage of your prostate medication, your upcoming biopsy for your right hand, and your current blood pressure management. We also reviewed your general health maintenance and potassium levels.  YOUR PLAN:  -PROSTATE CANCER: Prostate cancer is a type of cancer that occurs in the prostate gland. You should increase your medication to two pills a day as per your urologist's prescription. Please contact your urologist if you run out of medication.  -HYPERTENSION: Hypertension, or high blood pressure, is a condition where the force of the blood against your artery walls is too high. Your blood pressure is well controlled with your current medications, so please continue taking them as prescribed.  -PROSTATE CANCER FOLLOW-UP: Given your history of prostate cancer, it is important to monitor for recurrence. Please keep your appointment for the biopsy and we will also check your PSA levels.  -GENERAL HEALTH MAINTENANCE: We reviewed your general health maintenance. You declined the flu shot, and there are no changes to your current plan.  -POTASSIUM LEVELS: Your previous test showed low potassium levels. We will order a basic metabolic panel to check your current potassium levels.  INSTRUCTIONS:  Please follow up with your urologist if you run out of your prostate medication. Keep your appointment for the biopsy on your right hand. Continue taking your blood pressure medications as prescribed. We will order a basic metabolic panel to check your potassium levels.

## 2023-08-15 LAB — BASIC METABOLIC PANEL
BUN/Creatinine Ratio: 16 (ref 10–24)
BUN: 15 mg/dL (ref 8–27)
CO2: 24 mmol/L (ref 20–29)
Calcium: 9.5 mg/dL (ref 8.6–10.2)
Chloride: 101 mmol/L (ref 96–106)
Creatinine, Ser: 0.96 mg/dL (ref 0.76–1.27)
Glucose: 92 mg/dL (ref 70–99)
Potassium: 3.5 mmol/L (ref 3.5–5.2)
Sodium: 139 mmol/L (ref 134–144)
eGFR: 88 mL/min/{1.73_m2} (ref >59)

## 2023-08-17 ENCOUNTER — Other Ambulatory Visit: Payer: Self-pay

## 2023-08-18 ENCOUNTER — Other Ambulatory Visit: Payer: Self-pay

## 2023-08-19 LAB — PSA, TOTAL AND FREE
PSA, Free Pct: 4.9 %
PSA, Free: 0.58 ng/mL
Prostate Specific Ag, Serum: 11.9 ng/mL — ABNORMAL HIGH (ref 0.0–4.0)

## 2023-09-18 ENCOUNTER — Other Ambulatory Visit: Payer: Self-pay

## 2023-09-19 ENCOUNTER — Other Ambulatory Visit: Payer: Self-pay

## 2023-09-27 ENCOUNTER — Other Ambulatory Visit: Payer: Self-pay | Admitting: Urology

## 2023-10-02 ENCOUNTER — Other Ambulatory Visit: Payer: Self-pay

## 2023-10-03 ENCOUNTER — Other Ambulatory Visit: Payer: Self-pay

## 2023-10-06 ENCOUNTER — Encounter (HOSPITAL_COMMUNITY): Admission: RE | Admit: 2023-10-06 | Payer: 59 | Source: Ambulatory Visit

## 2023-10-11 ENCOUNTER — Ambulatory Visit (HOSPITAL_COMMUNITY): Admit: 2023-10-11 | Payer: 59 | Admitting: Urology

## 2023-10-11 SURGERY — BIOPSY, PROSTATE
Anesthesia: Monitor Anesthesia Care

## 2023-10-19 ENCOUNTER — Other Ambulatory Visit: Payer: Self-pay

## 2023-10-24 DIAGNOSIS — R3914 Feeling of incomplete bladder emptying: Secondary | ICD-10-CM | POA: Diagnosis not present

## 2023-11-15 ENCOUNTER — Other Ambulatory Visit: Payer: Self-pay

## 2023-11-16 ENCOUNTER — Other Ambulatory Visit: Payer: Self-pay

## 2023-12-19 ENCOUNTER — Other Ambulatory Visit: Payer: Self-pay | Admitting: Family Medicine

## 2023-12-19 ENCOUNTER — Other Ambulatory Visit: Payer: Self-pay

## 2023-12-19 DIAGNOSIS — I10 Essential (primary) hypertension: Secondary | ICD-10-CM

## 2023-12-19 MED ORDER — LISINOPRIL-HYDROCHLOROTHIAZIDE 20-25 MG PO TABS
1.0000 | ORAL_TABLET | Freq: Every day | ORAL | 0 refills | Status: DC
  Filled 2023-12-19: qty 90, 90d supply, fill #0

## 2023-12-20 ENCOUNTER — Other Ambulatory Visit: Payer: Self-pay

## 2023-12-21 ENCOUNTER — Other Ambulatory Visit: Payer: Self-pay | Admitting: Family Medicine

## 2023-12-21 ENCOUNTER — Other Ambulatory Visit: Payer: Self-pay

## 2023-12-21 DIAGNOSIS — N529 Male erectile dysfunction, unspecified: Secondary | ICD-10-CM

## 2023-12-21 MED ORDER — SILDENAFIL CITRATE 50 MG PO TABS: 50.0000 mg | ORAL_TABLET | Freq: Every day | ORAL | 0 refills | Status: DC

## 2023-12-28 ENCOUNTER — Other Ambulatory Visit: Payer: Self-pay

## 2024-01-17 ENCOUNTER — Other Ambulatory Visit: Payer: Self-pay

## 2024-01-18 ENCOUNTER — Other Ambulatory Visit: Payer: Self-pay

## 2024-01-29 DIAGNOSIS — R3914 Feeling of incomplete bladder emptying: Secondary | ICD-10-CM | POA: Diagnosis not present

## 2024-01-31 ENCOUNTER — Other Ambulatory Visit: Payer: Self-pay | Admitting: Urology

## 2024-02-09 ENCOUNTER — Telehealth: Payer: Self-pay | Admitting: Family Medicine

## 2024-02-09 NOTE — Telephone Encounter (Signed)
Contacted pt confirmed appt

## 2024-02-12 ENCOUNTER — Encounter: Payer: Self-pay | Admitting: Family Medicine

## 2024-02-12 ENCOUNTER — Other Ambulatory Visit: Payer: Self-pay

## 2024-02-12 ENCOUNTER — Ambulatory Visit: Payer: 59 | Attending: Family Medicine | Admitting: Family Medicine

## 2024-02-12 VITALS — BP 147/73 | HR 71 | Ht 66.0 in | Wt 172.6 lb

## 2024-02-12 DIAGNOSIS — Z8546 Personal history of malignant neoplasm of prostate: Secondary | ICD-10-CM | POA: Diagnosis not present

## 2024-02-12 DIAGNOSIS — Z131 Encounter for screening for diabetes mellitus: Secondary | ICD-10-CM

## 2024-02-12 DIAGNOSIS — N400 Enlarged prostate without lower urinary tract symptoms: Secondary | ICD-10-CM | POA: Diagnosis not present

## 2024-02-12 DIAGNOSIS — I10 Essential (primary) hypertension: Secondary | ICD-10-CM | POA: Diagnosis not present

## 2024-02-12 MED ORDER — VALSARTAN-HYDROCHLOROTHIAZIDE 160-25 MG PO TABS
1.0000 | ORAL_TABLET | Freq: Every day | ORAL | 1 refills | Status: DC
  Filled 2024-02-12: qty 30, 30d supply, fill #0
  Filled 2024-03-06: qty 30, 30d supply, fill #1

## 2024-02-12 MED ORDER — TAMSULOSIN HCL 0.4 MG PO CAPS
0.4000 mg | ORAL_CAPSULE | Freq: Every day | ORAL | 1 refills | Status: DC
  Filled 2024-02-12: qty 90, 90d supply, fill #0

## 2024-02-12 MED ORDER — AMLODIPINE BESYLATE 10 MG PO TABS
10.0000 mg | ORAL_TABLET | Freq: Every day | ORAL | 1 refills | Status: DC
Start: 2024-02-12 — End: 2024-02-27
  Filled 2024-02-12: qty 90, 90d supply, fill #0

## 2024-02-12 NOTE — Patient Instructions (Signed)
 VISIT SUMMARY:  You came in today for blood pressure management and a pre-operative evaluation. Your blood pressure is still high despite your current medications. You are scheduled for a cryoablation procedure for prostate cancer at the end of the month. Your recent blood tests, including liver and kidney function, are normal. You have not had your cholesterol checked since last year, and you maintain an active lifestyle by going to the gym regularly.  YOUR PLAN:  -HYPERTENSION: Hypertension means high blood pressure. Your blood pressure is still high despite taking amlodipine  and lisinopril -hydrochlorothiazide . We are switching you to valsartan -hydrochlorothiazide  160 mg/25 mg and you should continue taking amlodipine  10 mg daily. Please start the new medication immediately and stop taking lisinopril . We will recheck your blood pressure before you leave today.  -ADENOCARCINOMA OF THE PROSTATE: Adenocarcinoma of the prostate is a type of prostate cancer. You are scheduled for a cryoablation procedure on July 30th. Your PSA levels are low, and you are under the care of a urologist. We will order blood work and send the results to your urologist. Please discuss the details of the procedure with your urologist.  -BENIGN PROSTATIC HYPERPLASIA (BPH): Benign prostatic hyperplasia (BPH) is an enlarged prostate gland. You are currently managing this condition with tamsulosin . Please continue taking tamsulosin  as prescribed.  -GENERAL HEALTH MAINTENANCE: You have not had your cholesterol levels checked since last year. We recommend you return for fasting blood work to assess your cholesterol levels. Please schedule this test for Wednesday and remember to fast before the test, only water is allowed.  INSTRUCTIONS:  Please start taking valsartan -hydrochlorothiazide  160 mg/25 mg immediately and discontinue lisinopril . Continue taking amlodipine  10 mg daily. We will recheck your blood pressure before you leave  today. Schedule your fasting blood work for cholesterol assessment on Wednesday and remember to fast before the test, only water is allowed. Discuss the details of your upcoming cryoablation procedure with your urologist.

## 2024-02-12 NOTE — Progress Notes (Signed)
 Subjective:  Patient ID: Jeffery Graves, male    DOB: 1955/08/07  Age: 68 y.o. MRN: [id number]  CC: Medical Management of Chronic Issues     Discussed the use of AI scribe software for clinical note transcription with the patient, who gave verbal consent to proceed.  History of Present Illness Jeffery Graves is a 68 year old male with a history of Adenocarcinoma of the prostate (s/p treatment), Benign prostatic hypertrophy, hypertension, allergic rhinitis who presents for blood pressure management.  He has elevated blood pressure and does not have a means to monitor it at home. He takes amlodipine  and lisinopril  every morning. He experienced fatigue and some bleeding from a recent medication taken twice daily, though he cannot recall the name.  He is scheduled for a cryoablation procedure on the 30th of this month for prostate cancer. Recent blood tests, including liver and kidney function, are normal.  He has not had his cholesterol checked since last year. He maintains an active lifestyle, going to the gym five to six days a week.    Past Medical History:  Diagnosis Date   BPH (benign prostatic hyperplasia)    Hx of radiation therapy 09/06/10 - 10/29/10   prostate, seminal vesicles   Hypertension    on meds   Prostate cancer (HCC) 04/26/2010   gleason 7, vol 20.88 cc    Past Surgical History:  Procedure Laterality Date   BIOPSY PROSTATE      Family History  Problem Relation Age of Onset   Stroke Mother    Colon cancer Neg Hx    Colon polyps Neg Hx    Esophageal cancer Neg Hx    Stomach cancer Neg Hx    Rectal cancer Neg Hx     Social History   Socioeconomic History   Marital status: Divorced    Spouse name: Not on file   Number of children: Not on file   Years of education: Not on file   Highest education level: Not on file  Occupational History   Not on file  Tobacco Use   Smoking status: Former   Smokeless tobacco: Never  Vaping Use   Vaping status: Never  Used  Substance and Sexual Activity   Alcohol use: Yes    Alcohol/week: 7.0 standard drinks of alcohol    Types: 7 Standard drinks or equivalent per week   Drug use: No   Sexual activity: Not on file  Other Topics Concern   Not on file  Social History Narrative   Not on file   Social Drivers of Health   Financial Resource Strain: Not on file  Food Insecurity: Not on file  Transportation Needs: Not on file  Physical Activity: Not on file  Stress: Not on file  Social Connections: Not on file    No Known Allergies  Outpatient Medications Prior to Visit  Medication Sig Dispense Refill   sildenafil  (VIAGRA ) 50 MG tablet TAKE 1 TABLET BY MOUTH ONCE DAILY AS NEEDED FOR ERECTILE DYSFUNCTION. AT LEAST 24 HOURS BETWEEN DOSES 10 tablet 0   tamsulosin  (FLOMAX ) 0.4 MG CAPS capsule Take 1 capsule (0.4 mg total) by mouth 2 (two) times daily. 60 capsule 11   amLODipine  (NORVASC ) 10 MG tablet Take 1 tablet (10 mg total) by mouth daily. 90 tablet 1   lisinopril -hydrochlorothiazide  (ZESTORETIC ) 20-25 MG tablet Take 1 tablet by mouth daily. 90 tablet 0   sulfamethoxazole -trimethoprim  (BACTRIM  DS) 800-160 MG tablet Take 1 tablet by mouth 2 (two) times  daily. Begin morning of prostate biopsy (Patient not taking: Reported on 02/12/2024) 2 tablet 0   tamsulosin  (FLOMAX ) 0.4 MG CAPS capsule Take 1 capsule (0.4 mg total) by mouth daily. (Patient not taking: Reported on 02/12/2024) 10 capsule 0   No facility-administered medications prior to visit.     ROS Review of Systems  Constitutional:  Negative for activity change and appetite change.  HENT:  Negative for sinus pressure and sore throat.   Respiratory:  Negative for chest tightness, shortness of breath and wheezing.   Cardiovascular:  Negative for chest pain and palpitations.  Gastrointestinal:  Negative for abdominal distention, abdominal pain and constipation.  Genitourinary: Negative.   Musculoskeletal: Negative.   Psychiatric/Behavioral:   Negative for behavioral problems and dysphoric mood.     Objective:  BP (!) 147/73   Pulse 71   Ht 5' 6 (1.676 m)   Wt 172 lb 9.6 oz (78.3 kg)   SpO2 100%   BMI 27.86 kg/m      02/12/2024    9:08 AM 02/12/2024    8:41 AM 08/14/2023    8:42 AM  BP/Weight  Systolic BP 147 158 132  Diastolic BP 73 70 74  Wt. (Lbs)  172.6 184  BMI  27.86 kg/m2 29.7 kg/m2      Physical Exam Constitutional:      Appearance: He is well-developed.  Cardiovascular:     Rate and Rhythm: Normal rate.     Heart sounds: Normal heart sounds. No murmur heard. Pulmonary:     Effort: Pulmonary effort is normal.     Breath sounds: Normal breath sounds. No wheezing or rales.  Chest:     Chest wall: No tenderness.  Abdominal:     General: Bowel sounds are normal. There is no distension.     Palpations: Abdomen is soft. There is no mass.     Tenderness: There is no abdominal tenderness.  Musculoskeletal:        General: Normal range of motion.     Right lower leg: No edema.     Left lower leg: No edema.  Neurological:     Mental Status: He is alert and oriented to person, place, and time.  Psychiatric:        Mood and Affect: Mood normal.        Latest Ref Rng & Units 08/14/2023    9:12 AM 05/31/2023   11:00 PM 12/27/2022    9:20 AM  CMP  Glucose 70 - 99 mg/dL 92  885  896   BUN 8 - 27 mg/dL 15  12  13    Creatinine 0.76 - 1.27 mg/dL 9.03  8.90  8.91   Sodium 134 - 144 mmol/L 139  138  136   Potassium 3.5 - 5.2 mmol/L 3.5  3.3  3.7   Chloride 96 - 106 mmol/L 101  103  96   CO2 20 - 29 mmol/L 24  25  24    Calcium 8.6 - 10.2 mg/dL 9.5  9.4  9.7   Total Protein 6.0 - 8.5 g/dL   7.6   Total Bilirubin 0.0 - 1.2 mg/dL   0.9   Alkaline Phos 44 - 121 IU/L   93   AST 0 - 40 IU/L   28   ALT 0 - 44 IU/L   24     Lipid Panel     Component Value Date/Time   CHOL 199 12/27/2022 0920   TRIG 56 12/27/2022 0920   HDL 87  12/27/2022 0920   CHOLHDL 3.0 11/05/2018 0848   CHOLHDL 2.1 11/11/2015 0902    VLDL 7 11/11/2015 0902   LDLCALC 102 (H) 12/27/2022 0920    CBC    Component Value Date/Time   WBC 5.1 05/31/2023 2300   RBC 5.41 05/31/2023 2300   HGB 14.7 05/31/2023 2300   HGB 15.9 12/27/2022 0920   HCT 45.2 05/31/2023 2300   HCT 46.9 12/27/2022 0920   PLT 214 05/31/2023 2300   PLT 201 12/27/2022 0920   MCV 83.5 05/31/2023 2300   MCV 81 12/27/2022 0920   MCH 27.2 05/31/2023 2300   MCHC 32.5 05/31/2023 2300   RDW 13.4 05/31/2023 2300   RDW 14.0 12/27/2022 0920   LYMPHSABS 1.4 12/27/2022 0920   MONOABS 0.6 01/27/2010 2219   EOSABS 0.1 12/27/2022 0920   BASOSABS 0.0 12/27/2022 0920    Lab Results  Component Value Date   HGBA1C 5.1 06/21/2022       Assessment & Plan Hypertension Blood pressure remains elevated on amlodipine  and lisinopril -hydrochlorothiazide . Switching to valsartan -hydrochlorothiazide  expected to improve control. - Switch lisinopril -hydrochlorothiazide  to valsartan -hydrochlorothiazide  160 mg/25 mg. - Continue amlodipine  10 mg daily. - Recheck blood pressure before discharge. - Instructed to start valsartan  immediately and discontinue lisinopril /hctz  History of Adenocarcinoma of the prostate Scheduled for cryoablation on July 30th. Under urologist care. Advised to discuss procedure details with urologist. - Order blood work and fax results to urologist. - Encourage discussion with urologist for procedure clarification.  Benign prostatic hyperplasia (BPH) Currently managed with tamsulosin . - Continue tamsulosin  as prescribed.  General Health Maintenance screening for diabetes mellitus Cholesterol levels not checked since last year. Advised to return for fasting blood work. - Schedule fasting blood work for cholesterol assessment on Wednesday. - Advise fasting before test, only water allowed.    Meds ordered this encounter  Medications   amLODipine  (NORVASC ) 10 MG tablet    Sig: Take 1 tablet (10 mg total) by mouth daily.    Dispense:  90  tablet    Refill:  1   tamsulosin  (FLOMAX ) 0.4 MG CAPS capsule    Sig: Take 1 capsule (0.4 mg total) by mouth daily.    Dispense:  90 capsule    Refill:  1   valsartan -hydrochlorothiazide  (DIOVAN -HCT) 160-25 MG tablet    Sig: Take 1 tablet by mouth daily.    Dispense:  90 tablet    Refill:  1    Discontinue lisinopril /HCTZ    Follow-up: Return in about 6 months (around 08/14/2024) for Chronic medical conditions.       Corrina Sabin, MD, FAAFP. [facility] [place], [place] [phone]   02/12/2024, 9:29 AM

## 2024-02-13 ENCOUNTER — Ambulatory Visit: Payer: Self-pay | Admitting: Family Medicine

## 2024-02-13 LAB — CBC WITH DIFFERENTIAL/PLATELET
Basophils Absolute: 0 x10E3/uL (ref 0.0–0.2)
Basos: 0 %
EOS (ABSOLUTE): 0.1 x10E3/uL (ref 0.0–0.4)
Eos: 3 %
Hematocrit: 40.6 % (ref 37.5–51.0)
Hemoglobin: 13.5 g/dL (ref 13.0–17.7)
Immature Grans (Abs): 0 x10E3/uL (ref 0.0–0.1)
Immature Granulocytes: 0 %
Lymphocytes Absolute: 1.6 x10E3/uL (ref 0.7–3.1)
Lymphs: 39 %
MCH: 27.3 pg (ref 26.6–33.0)
MCHC: 33.3 g/dL (ref 31.5–35.7)
MCV: 82 fL (ref 79–97)
Monocytes Absolute: 0.5 x10E3/uL (ref 0.1–0.9)
Monocytes: 12 %
Neutrophils Absolute: 1.8 x10E3/uL (ref 1.4–7.0)
Neutrophils: 46 %
Platelets: 187 x10E3/uL (ref 150–450)
RBC: 4.95 x10E6/uL (ref 4.14–5.80)
RDW: 13.3 % (ref 11.6–15.4)
WBC: 4 x10E3/uL (ref 3.4–10.8)

## 2024-02-13 LAB — CMP14+EGFR
ALT: 12 IU/L (ref 0–44)
AST: 15 IU/L (ref 0–40)
Albumin: 4 g/dL (ref 3.9–4.9)
Alkaline Phosphatase: 83 IU/L (ref 44–121)
BUN/Creatinine Ratio: 18 (ref 10–24)
BUN: 16 mg/dL (ref 8–27)
Bilirubin Total: 0.6 mg/dL (ref 0.0–1.2)
CO2: 23 mmol/L (ref 20–29)
Calcium: 9.1 mg/dL (ref 8.6–10.2)
Chloride: 100 mmol/L (ref 96–106)
Creatinine, Ser: 0.87 mg/dL (ref 0.76–1.27)
Globulin, Total: 2.8 g/dL (ref 1.5–4.5)
Glucose: 79 mg/dL (ref 70–99)
Potassium: 3.8 mmol/L (ref 3.5–5.2)
Sodium: 137 mmol/L (ref 134–144)
Total Protein: 6.8 g/dL (ref 6.0–8.5)
eGFR: 95 mL/min/1.73 (ref >59)

## 2024-02-13 LAB — PSA, TOTAL AND FREE
PSA, Free Pct: <20 %
PSA, Free: <0.02 ng/mL
Prostate Specific Ag, Serum: 0.1 ng/mL (ref 0.0–4.0)

## 2024-02-13 LAB — HEMOGLOBIN A1C
Est. average glucose Bld gHb Est-mCnc: 108 mg/dL
Hgb A1c MFr Bld: 5.4 % (ref 4.8–5.6)

## 2024-02-23 NOTE — Patient Instructions (Signed)
 SURGICAL WAITING ROOM VISITATION  Patients having surgery or a procedure may have no more than 2 support people in the waiting area - these visitors may rotate.    Children under the age of 75 must have an adult with them who is not the patient.  Visitors with respiratory illnesses are discouraged from visiting and should remain at home.  If the patient needs to stay at the hospital during part of their recovery, the visitor guidelines for inpatient rooms apply. Pre-op nurse will coordinate an appropriate time for 1 support person to accompany patient in pre-op.  This support person may not rotate.    Please refer to the [facility] website for the visitor guidelines for Inpatients (after your surgery is over and you are in a regular room).       Your procedure is scheduled on:  03/06/2024    Report to [facility] Main Entrance    Report to admitting at   130 pm    Call this number if you have problems the morning of surgery [phone]                         Clear liquid diet the day before surgery.              Magnesium Citrate- 8 ounces at 12 noon day before surgery    After Midnight you may have the following liquids until _ 1230 pm _____ AM  DAY OF SURGERY  Water Non-Citrus Juices (without pulp, NO RED-Apple, White grape, White cranberry) Black Coffee (NO MILK/CREAM OR CREAMERS, sugar ok)  Clear Tea (NO MILK/CREAM OR CREAMERS, sugar ok) regular and decaf                             Plain Jell-O (NO RED)                                           Fruit ices (not with fruit pulp, NO RED)                                     Popsicles (NO RED)                                                               Sports drinks like Gatorade (NO RED)                             If you have questions, please contact your surgeon's office.   FOLLOW BOWEL PREP AND ANY ADDITIONAL PRE OP INSTRUCTIONS YOU RECEIVED FROM YOUR SURGEON'S OFFICE!!!     Oral Hygiene is also  important to reduce your risk of infection.                                    Remember - BRUSH YOUR TEETH THE MORNING OF SURGERY WITH YOUR REGULAR TOOTHPASTE  DENTURES WILL  BE REMOVED PRIOR TO SURGERY PLEASE DO NOT APPLY Poly grip OR ADHESIVES!!!   Do NOT smoke after Midnight   Stop all vitamins and herbal supplements 7 days before surgery.   Take these medicines the morning of surgery with A SIP OF WATER:  amlodipine  , flomax    DO NOT TAKE ANY ORAL DIABETIC MEDICATIONS DAY OF YOUR SURGERY  Bring CPAP mask and tubing day of surgery.                              You may not have any metal on your body including hair pins, jewelry, and body piercing             Do not wear make-up, lotions, powders, perfumes/cologne, or deodorant  Do not wear nail polish including gel and S&S, artificial/acrylic nails, or any other type of covering on natural nails including finger and toenails. If you have artificial nails, gel coating, etc. that needs to be removed by a nail salon please have this removed prior to surgery or surgery may need to be canceled/ delayed if the surgeon/ anesthesia feels like they are unable to be safely monitored.   Do not shave  48 hours prior to surgery.               Men may shave face and neck.   Do not bring valuables to the hospital. [facility] IS NOT             RESPONSIBLE   FOR VALUABLES.   Contacts, glasses, dentures or bridgework may not be worn into surgery.   Bring small overnight bag day of surgery.   DO NOT BRING YOUR HOME MEDICATIONS TO THE HOSPITAL. PHARMACY WILL DISPENSE MEDICATIONS LISTED ON YOUR MEDICATION LIST TO YOU DURING YOUR ADMISSION IN THE HOSPITAL!    Patients discharged on the day of surgery will not be allowed to drive home.  Someone NEEDS to stay with you for the first 24 hours after anesthesia.   Special Instructions: Bring a copy of your healthcare power of attorney and living will documents the day of surgery if you haven't scanned  them before.              Please read over the following fact sheets you were given: IF YOU HAVE QUESTIONS ABOUT YOUR PRE-OP INSTRUCTIONS PLEASE CALL [phone].   If you received a COVID test during your pre-op visit  it is requested that you wear a mask when out in public, stay away from anyone that may not be feeling well and notify your surgeon if you develop symptoms. If you test positive for Covid or have been in contact with anyone that has tested positive in the last 10 days please notify you surgeon.    [facility] - Preparing for Surgery Before surgery, you can play an important role.  Because skin is not sterile, your skin needs to be as free of germs as possible.  You can reduce the number of germs on your skin by washing with CHG (chlorahexidine gluconate) soap before surgery.  CHG is an antiseptic cleaner which kills germs and bonds with the skin to continue killing germs even after washing. Please DO NOT use if you have an allergy to CHG or antibacterial soaps.  If your skin becomes reddened/irritated stop using the CHG and inform your nurse when you arrive at Short Stay. Do not shave (including legs and underarms) for at least  48 hours prior to the first CHG shower.  You may shave your face/neck. Please follow these instructions carefully:  1.  Shower with CHG Soap the night before surgery and the  morning of Surgery.  2.  If you choose to wash your hair, wash your hair first as usual with your  normal  shampoo.  3.  After you shampoo, rinse your hair and body thoroughly to remove the  shampoo.                           4.  Use CHG as you would any other liquid soap.  You can apply chg directly  to the skin and wash                       Gently with a scrungie or clean washcloth.  5.  Apply the CHG Soap to your body ONLY FROM THE NECK DOWN.   Do not use on face/ open                           Wound or open sores. Avoid contact with eyes, ears mouth and genitals (private parts).                        Wash face,  Genitals (private parts) with your normal soap.             6.  Wash thoroughly, paying special attention to the area where your surgery  will be performed.  7.  Thoroughly rinse your body with warm water from the neck down.  8.  DO NOT shower/wash with your normal soap after using and rinsing off  the CHG Soap.                9.  Pat yourself dry with a clean towel.            10.  Wear clean pajamas.            11.  Place clean sheets on your bed the night of your first shower and do not  sleep with pets. Day of Surgery : Do not apply any lotions/deodorants the morning of surgery.  Please wear clean clothes to the hospital/surgery center.  FAILURE TO FOLLOW THESE INSTRUCTIONS MAY RESULT IN THE CANCELLATION OF YOUR SURGERY PATIENT SIGNATURE_________________________________  NURSE SIGNATURE__________________________________  ________________________________________________________________________

## 2024-02-23 NOTE — Progress Notes (Addendum)
 Anesthesia Review:  PCP: Corrina Sabin   Cardiologist : none   PPM/ ICD: Device Orders: Rep Notified:  Chest x-ray : EKG : 02/27/24  Echo : Stress test: Cardiac Cath :   Activity level: can do a flight of stairs without difficutly  Works out at gym 5 days per week  Sleep Study/ CPAP : none  Fasting Blood Sugar :      / Checks Blood Sugar -- times a day:    Blood Thinner/ Instructions /Last Dose: ASA / Instructions/ Last Dose :    PT does not read very well.  Went over preop instrucitons x 2 with pt anD will call pt on Monday 03/04/2024 to review again prior to surgery .  [phone] Called pt on 03/04/24 and LVMM.     PT asked several questions in regards to foley catheter.  Answered questions for pt.   PT also asked questions in regards to sex after surgery.  Deferred that for pt to ask surgeon.  PT voiced understanding.  PT informed nurse that he was unable to read very well.      BMp with potassium of 3.1 on 02/27/24 rotued to [facility] on 02/27/24.     PT called preop nurse back on 03/05/24.  Preop nurse returned call and reviewed preop instructions with pt .  PT voiced understanding.  PT states he has completed Magnesium Citrate as instructed. PT instructed to take meds today as normal and on 03/06/24 to only take the 2 medicines that the preop nurse marked with an x on the bottles.  PT again voiced understanding.  PT aware he does not have to have anyone here with him while surgery is being complegted only a phone number for who is picking him up.  PST again voiced understanding.,

## 2024-02-27 ENCOUNTER — Encounter (HOSPITAL_COMMUNITY)
Admission: RE | Admit: 2024-02-27 | Discharge: 2024-02-27 | Disposition: A | Discharge status: Home / Self Care | Source: Ambulatory Visit | Attending: Urology | Admitting: Urology

## 2024-02-27 ENCOUNTER — Other Ambulatory Visit: Payer: Self-pay

## 2024-02-27 ENCOUNTER — Encounter (HOSPITAL_COMMUNITY): Payer: Self-pay

## 2024-02-27 VITALS — BP 140/89 | HR 80 | Temp 98.2°F | Resp 16 | Ht 66.0 in | Wt 170.0 lb

## 2024-02-27 DIAGNOSIS — Z01818 Encounter for other preprocedural examination: Secondary | ICD-10-CM | POA: Insufficient documentation

## 2024-02-27 DIAGNOSIS — R9431 Abnormal electrocardiogram [ECG] [EKG]: Secondary | ICD-10-CM | POA: Insufficient documentation

## 2024-02-27 DIAGNOSIS — Z01810 Encounter for preprocedural cardiovascular examination: Secondary | ICD-10-CM | POA: Diagnosis present

## 2024-02-27 DIAGNOSIS — Z01812 Encounter for preprocedural laboratory examination: Secondary | ICD-10-CM | POA: Diagnosis present

## 2024-02-27 LAB — BASIC METABOLIC PANEL WITH GFR
Anion gap: 8 (ref 5–15)
BUN: 15 mg/dL (ref 8–23)
CO2: 25 mmol/L (ref 22–32)
Calcium: 8.8 mg/dL — ABNORMAL LOW (ref 8.9–10.3)
Chloride: 101 mmol/L (ref 98–111)
Creatinine, Ser: 0.91 mg/dL (ref 0.61–1.24)
GFR, Estimated: >60 mL/min (ref >60)
Glucose, Bld: 102 mg/dL — ABNORMAL HIGH (ref 70–99)
Potassium: 3.1 mmol/L — ABNORMAL LOW (ref 3.5–5.1)
Sodium: 134 mmol/L — ABNORMAL LOW (ref 135–145)

## 2024-02-27 LAB — CBC
HCT: 40.2 % (ref 39.0–52.0)
Hemoglobin: 12.8 g/dL — ABNORMAL LOW (ref 13.0–17.0)
MCH: 26.2 pg (ref 26.0–34.0)
MCHC: 31.8 g/dL (ref 30.0–36.0)
MCV: 82.4 fL (ref 80.0–100.0)
Platelets: 164 K/uL (ref 150–400)
RBC: 4.88 MIL/uL (ref 4.22–5.81)
RDW: 14.3 % (ref 11.5–15.5)
WBC: 3.8 K/uL — ABNORMAL LOW (ref 4.0–10.5)
nRBC: 0 % (ref 0.0–0.2)

## 2024-03-06 ENCOUNTER — Ambulatory Visit (HOSPITAL_BASED_OUTPATIENT_CLINIC_OR_DEPARTMENT_OTHER): Admitting: Anesthesiology

## 2024-03-06 ENCOUNTER — Ambulatory Visit (HOSPITAL_COMMUNITY): Payer: Self-pay | Admitting: Physician Assistant

## 2024-03-06 ENCOUNTER — Observation Stay (HOSPITAL_COMMUNITY)
Admission: RE | Admit: 2024-03-06 | Discharge: 2024-03-07 | Disposition: A | Discharge status: Home / Self Care | Attending: Urology | Admitting: Urology

## 2024-03-06 ENCOUNTER — Other Ambulatory Visit: Payer: Self-pay

## 2024-03-06 ENCOUNTER — Encounter (HOSPITAL_COMMUNITY)
Admission: RE | Disposition: A | Discharge status: Home / Self Care | Payer: Self-pay | Source: Home / Self Care | Attending: Urology

## 2024-03-06 ENCOUNTER — Encounter (HOSPITAL_COMMUNITY): Payer: Self-pay | Admitting: Urology

## 2024-03-06 DIAGNOSIS — Z923 Personal history of irradiation: Secondary | ICD-10-CM | POA: Diagnosis not present

## 2024-03-06 DIAGNOSIS — I10 Essential (primary) hypertension: Secondary | ICD-10-CM | POA: Insufficient documentation

## 2024-03-06 DIAGNOSIS — C61 Malignant neoplasm of prostate: Secondary | ICD-10-CM | POA: Diagnosis present

## 2024-03-06 DIAGNOSIS — R399 Unspecified symptoms and signs involving the genitourinary system: Secondary | ICD-10-CM | POA: Insufficient documentation

## 2024-03-06 DIAGNOSIS — N400 Enlarged prostate without lower urinary tract symptoms: Secondary | ICD-10-CM | POA: Diagnosis not present

## 2024-03-06 DIAGNOSIS — Z01818 Encounter for other preprocedural examination: Secondary | ICD-10-CM

## 2024-03-06 HISTORY — PX: CRYOABLATION: SHX1415

## 2024-03-06 SURGERY — CRYOABLATION, PROSTATE
Anesthesia: General

## 2024-03-06 MED ORDER — TAMSULOSIN HCL 0.4 MG PO CAPS
0.4000 mg | ORAL_CAPSULE | Freq: Two times a day (BID) | ORAL | Status: DC
  Administered 2024-03-06 – 2024-03-07 (×2): 0.4 mg via ORAL
  Filled 2024-03-06 (×2): qty 1

## 2024-03-06 MED ORDER — SODIUM CHLORIDE 0.9 % IV SOLN: INTRAVENOUS | Status: DC

## 2024-03-06 MED ORDER — ACETAMINOPHEN 500 MG PO TABS
1000.0000 mg | ORAL_TABLET | Freq: Once | ORAL | Status: AC
  Administered 2024-03-06: 1000 mg via ORAL
  Filled 2024-03-06: qty 2

## 2024-03-06 MED ORDER — SODIUM CHLORIDE 0.9 % IR SOLN
Status: DC | PRN
  Administered 2024-03-06: 2000 mL via INTRAVESICAL

## 2024-03-06 MED ORDER — DEXMEDETOMIDINE HCL IN NACL 80 MCG/20ML IV SOLN
INTRAVENOUS | Status: DC | PRN
  Administered 2024-03-06: 8 ug via INTRAVENOUS
  Administered 2024-03-06: 4 ug via INTRAVENOUS

## 2024-03-06 MED ORDER — ACETAMINOPHEN 500 MG PO TABS
1000.0000 mg | ORAL_TABLET | Freq: Four times a day (QID) | ORAL | Status: AC
  Administered 2024-03-06 – 2024-03-07 (×4): 1000 mg via ORAL
  Filled 2024-03-06 (×4): qty 2

## 2024-03-06 MED ORDER — SENNOSIDES-DOCUSATE SODIUM 8.6-50 MG PO TABS
1.0000 | ORAL_TABLET | Freq: Two times a day (BID) | ORAL | Status: DC
  Administered 2024-03-06 – 2024-03-07 (×2): 1 via ORAL
  Filled 2024-03-06 (×2): qty 1

## 2024-03-06 MED ORDER — ONDANSETRON HCL 4 MG/2ML IJ SOLN
INTRAMUSCULAR | Status: DC | PRN
  Administered 2024-03-06: 4 mg via INTRAVENOUS

## 2024-03-06 MED ORDER — CHLORHEXIDINE GLUCONATE 0.12 % MT SOLN
15.0000 mL | Freq: Once | OROMUCOSAL | Status: AC
  Administered 2024-03-06: 15 mL via OROMUCOSAL

## 2024-03-06 MED ORDER — NAPHAZOLINE-GLYCERIN 0.012-0.25 % OP SOLN: 1.0000 [drp] | Freq: Four times a day (QID) | OPHTHALMIC | Status: DC | PRN

## 2024-03-06 MED ORDER — MIDAZOLAM HCL 2 MG/2ML IJ SOLN
INTRAMUSCULAR | Status: DC | PRN
  Administered 2024-03-06: 2 mg via INTRAVENOUS

## 2024-03-06 MED ORDER — CELECOXIB 200 MG PO CAPS
200.0000 mg | ORAL_CAPSULE | Freq: Once | ORAL | Status: AC
  Administered 2024-03-06: 200 mg via ORAL
  Filled 2024-03-06: qty 1

## 2024-03-06 MED ORDER — HYDROMORPHONE HCL 1 MG/ML IJ SOLN: 0.5000 mg | INTRAMUSCULAR | Status: DC | PRN

## 2024-03-06 MED ORDER — CHLORHEXIDINE GLUCONATE CLOTH 2 % EX PADS
6.0000 | MEDICATED_PAD | Freq: Every day | CUTANEOUS | Status: DC
  Administered 2024-03-07: 6 via TOPICAL

## 2024-03-06 MED ORDER — DEXAMETHASONE SODIUM PHOSPHATE 10 MG/ML IJ SOLN
INTRAMUSCULAR | Status: DC | PRN
  Administered 2024-03-06: 10 mg via INTRAVENOUS

## 2024-03-06 MED ORDER — OXYCODONE HCL 5 MG PO TABS: 5.0000 mg | ORAL_TABLET | Freq: Once | ORAL | Status: DC | PRN

## 2024-03-06 MED ORDER — ONDANSETRON HCL 4 MG/2ML IJ SOLN: 4.0000 mg | Freq: Once | INTRAMUSCULAR | Status: DC | PRN

## 2024-03-06 MED ORDER — VALSARTAN-HYDROCHLOROTHIAZIDE 160-25 MG PO TABS: 1.0000 | ORAL_TABLET | Freq: Every day | ORAL | Status: DC

## 2024-03-06 MED ORDER — MEPERIDINE HCL 25 MG/ML IJ SOLN: 6.2500 mg | INTRAMUSCULAR | Status: DC | PRN

## 2024-03-06 MED ORDER — FENTANYL CITRATE (PF) 100 MCG/2ML IJ SOLN
INTRAMUSCULAR | Status: AC
  Filled 2024-03-06: qty 2

## 2024-03-06 MED ORDER — LACTATED RINGERS IV SOLN: INTRAVENOUS | Status: DC

## 2024-03-06 MED ORDER — HYDRALAZINE HCL 20 MG/ML IJ SOLN
INTRAMUSCULAR | Status: AC
  Filled 2024-03-06: qty 1

## 2024-03-06 MED ORDER — OXYCODONE HCL 5 MG/5ML PO SOLN: 5.0000 mg | Freq: Once | ORAL | Status: DC | PRN

## 2024-03-06 MED ORDER — PROPOFOL 10 MG/ML IV BOLUS
INTRAVENOUS | Status: AC
  Filled 2024-03-06: qty 20

## 2024-03-06 MED ORDER — FENTANYL CITRATE (PF) 100 MCG/2ML IJ SOLN
INTRAMUSCULAR | Status: AC
Start: 2024-03-06 — End: 2024-03-06
  Filled 2024-03-06: qty 2

## 2024-03-06 MED ORDER — PROPOFOL 10 MG/ML IV BOLUS
INTRAVENOUS | Status: DC | PRN
Start: 2024-03-06 — End: 2024-03-06
  Administered 2024-03-06: 200 mg via INTRAVENOUS

## 2024-03-06 MED ORDER — FENTANYL CITRATE PF 50 MCG/ML IJ SOSY: 25.0000 ug | PREFILLED_SYRINGE | INTRAMUSCULAR | Status: DC | PRN

## 2024-03-06 MED ORDER — BACITRACIN ZINC 500 UNIT/GM EX OINT
TOPICAL_OINTMENT | CUTANEOUS | Status: AC
  Filled 2024-03-06: qty 28.35

## 2024-03-06 MED ORDER — IRBESARTAN 150 MG PO TABS
150.0000 mg | ORAL_TABLET | Freq: Every day | ORAL | Status: DC
  Administered 2024-03-06 – 2024-03-07 (×2): 150 mg via ORAL
  Filled 2024-03-06 (×2): qty 1

## 2024-03-06 MED ORDER — OXYCODONE HCL 5 MG PO TABS: 5.0000 mg | ORAL_TABLET | ORAL | Status: DC | PRN

## 2024-03-06 MED ORDER — ORAL CARE MOUTH RINSE: 15.0000 mL | Freq: Once | OROMUCOSAL | Status: AC

## 2024-03-06 MED ORDER — LIDOCAINE HCL (PF) 2 % IJ SOLN
INTRAMUSCULAR | Status: DC | PRN
  Administered 2024-03-06: 40 mg via INTRADERMAL

## 2024-03-06 MED ORDER — BACITRACIN ZINC 500 UNIT/GM EX OINT
TOPICAL_OINTMENT | CUTANEOUS | Status: DC | PRN
  Administered 2024-03-06: 1 via TOPICAL

## 2024-03-06 MED ORDER — BRIMONIDINE TARTRATE 0.025 % OP SOLN: Freq: Every day | OPHTHALMIC | Status: DC

## 2024-03-06 MED ORDER — FENTANYL CITRATE (PF) 250 MCG/5ML IJ SOLN
INTRAMUSCULAR | Status: DC | PRN
  Administered 2024-03-06: 50 ug via INTRAVENOUS
  Administered 2024-03-06 (×6): 25 ug via INTRAVENOUS

## 2024-03-06 MED ORDER — MIDAZOLAM HCL 2 MG/2ML IJ SOLN
INTRAMUSCULAR | Status: AC
Start: 2024-03-06 — End: 2024-03-06
  Filled 2024-03-06: qty 2

## 2024-03-06 MED ORDER — HYDRALAZINE HCL 20 MG/ML IJ SOLN
5.0000 mg | Freq: Once | INTRAMUSCULAR | Status: AC
  Administered 2024-03-06: 5 mg via INTRAVENOUS

## 2024-03-06 MED ORDER — GENTAMICIN SULFATE 40 MG/ML IJ SOLN
380.0000 mg | INTRAVENOUS | Status: AC
  Administered 2024-03-06: 380 mg via INTRAVENOUS
  Filled 2024-03-06: qty 9.5

## 2024-03-06 MED ORDER — MIDAZOLAM HCL 2 MG/2ML IJ SOLN
INTRAMUSCULAR | Status: AC
  Filled 2024-03-06: qty 2

## 2024-03-06 MED ORDER — HYDROCHLOROTHIAZIDE 25 MG PO TABS
25.0000 mg | ORAL_TABLET | Freq: Every day | ORAL | Status: DC
  Administered 2024-03-07: 25 mg via ORAL
  Filled 2024-03-06: qty 1

## 2024-03-06 SURGICAL SUPPLY — 15 items
BAG URINE DRAIN 2000ML AR STRL (UROLOGICAL SUPPLIES) ×1 IMPLANT
BNDG GAUZE DERMACEA FLUFF 4 (GAUZE/BANDAGES/DRESSINGS) ×1 IMPLANT
CATH FOLEY 2WAY SLVR 5CC 18FR (CATHETERS) ×2 IMPLANT
DRSG TELFA 3X8 NADH STRL (GAUZE/BANDAGES/DRESSINGS) ×1 IMPLANT
FEE RENTAL FIBER LASER LAT 600 (Laser) IMPLANT
GAS ARGON HIGH PRESSURE (MEDICAL GASES) ×1 IMPLANT
GAS HELIUM HIGH PRESSURE (MEDICAL GASES) ×1 IMPLANT
GLOVE SURG LX STRL 7.5 STRW (GLOVE) ×1 IMPLANT
GOWN STRL REUS W/ TWL XL LVL3 (GOWN DISPOSABLE) ×1 IMPLANT
GUIDEWIRE AMPLATZ STIFF 0.35 (WIRE) ×1 IMPLANT
KIT EC CRYO PROSTATE CRYO207V (DISPOSABLE) IMPLANT
KIT TURNOVER KIT A (KITS) ×1 IMPLANT
PACK CYSTO (CUSTOM PROCEDURE TRAY) ×1 IMPLANT
PLUG CATH AND CAP STRL 200 (CATHETERS) ×1 IMPLANT
TOWEL OR 17X26 10 PK STRL BLUE (TOWEL DISPOSABLE) ×1 IMPLANT

## 2024-03-06 NOTE — Anesthesia Postprocedure Evaluation (Signed)
 Anesthesia Post Note  Patient: Jeffery Graves  Procedure(s) Performed: CRYOABLATION, PROSTATE, CYSTOSCOPY     Patient location during evaluation: PACU Anesthesia Type: General Level of consciousness: awake and alert Pain management: pain level controlled Vital Signs Assessment: post-procedure vital signs reviewed and stable Respiratory status: spontaneous breathing, nonlabored ventilation, respiratory function stable and patient connected to nasal cannula oxygen Cardiovascular status: blood pressure returned to baseline and stable Postop Assessment: no apparent nausea or vomiting Anesthetic complications: no   No notable events documented.  Last Vitals:  Vitals:   03/06/24 1745 03/06/24 1800  BP: (!) 216/96 (!) 182/102  Pulse: (!) 56 64  Resp: 11 11  Temp:  (!) 36.1 C  SpO2: 99% 98%    Last Pain:  Vitals:   03/06/24 1321  TempSrc: Oral                 Jeffery Graves

## 2024-03-06 NOTE — Discharge Instructions (Signed)
 1-  Catheter - Will remain in place until removed at your next appointment. It may be cleaned with soap and water in the shower. It may be disconnected from the drain bag while in the shower to avoid tripping over the tube. You may apply Neosporin or Vaseline ointment as needed to the tip of the penis where the catheter inserts to reduce friction and irritation in this spot.   2 - When to Call the Doctor - Call MD for any fever >102, any acute wound problems, or any severe nausea / vomiting. You can call the [facility] [phone]) 24 hours a day 365 days a year. It will roll-over to the answering service and on-call physician after hours.

## 2024-03-06 NOTE — Anesthesia Preprocedure Evaluation (Addendum)
 Anesthesia Evaluation    Reviewed: Allergy & Precautions, H&P , Patient's Chart, lab work & pertinent test results  Airway Mallampati: I  TM Distance: >3 FB Neck ROM: Full    Dental no notable dental hx. (+) Poor Dentition, Chipped, Missing, Loose, Dental Advisory Given,    Pulmonary neg pulmonary ROS   Pulmonary exam normal breath sounds clear to auscultation       Cardiovascular Exercise Tolerance: Good hypertension, Pt. on medications Normal cardiovascular exam Rhythm:Regular Rate:Normal     Neuro/Psych negative neurological ROS  negative psych ROS   GI/Hepatic negative GI ROS, Neg liver ROS,,,  Endo/Other  negative endocrine ROS    Renal/GU negative Renal ROS  negative genitourinary   Musculoskeletal   Abdominal   Peds  Hematology negative hematology ROS (+)   Anesthesia Other Findings   Reproductive/Obstetrics negative OB ROS                              Anesthesia Physical Anesthesia Plan  ASA: 3  Anesthesia Plan: General   Post-op Pain Management: Tylenol  PO (pre-op)*, Celebrex  PO (pre-op)* and Minimal or no pain anticipated   Induction: Intravenous  PONV Risk Score and Plan: 2 and Ondansetron , Dexamethasone  and Treatment may vary due to age or medical condition  Airway Management Planned: Oral ETT and LMA  Additional Equipment: None  Intra-op Plan:   Post-operative Plan: Extubation in OR  Informed Consent: I have reviewed the patients History and Physical, chart, labs and discussed the procedure including the risks, benefits and alternatives for the proposed anesthesia with the patient or authorized representative who has indicated his/her understanding and acceptance.       Plan Discussed with: Anesthesiologist and CRNA  Anesthesia Plan Comments:          Anesthesia Quick Evaluation

## 2024-03-06 NOTE — H&P (Signed)
 Jeffery Graves is an 68 y.o. male.    Chief Complaint: Pre-Op Prostate Salvage Cryotherapy  HPI:   1 - Recurrent Prostate Cancer -  Initial DX: 2011 Graded 2 cancer by Nesi  Primary TX: External Beam radiation 2011 (in his 25s)  Additional TX: none  Most Recent Restaging: PET-CT 11/2022 - no gross disease  Current Regimen: Xtandi 160 QD monotherapy ([facility])   Recent Course:  07/2023 PSA 8.6 PCP labs  09/2023 ==> 3/12 cores up to 50% mix grade 1 and 2 disease by TRUS BX 16gm, no median. All left sided / mostly Lt apex. => refused salvage cryo  12/2023 - PSA 0.12, T 682, CMP Nl ==> Xtandi 80mg .   2 - Lower Urinary Tract Symptoms / Incomplete Emptying - prostae 35gm on CT 2024 (NOT enlarged). s/p radiaotn as per above., ON tamsulosin  with PVR's 200-300mL rance (aceptable) with very minimal bother.   PMH sig for HTN. NO CV diseaes / blood thinners. No chest/abd surgeris. REtired form concrete work. Enjoygs going to gym 5-6 days a week. His PCP is with [facility].    Today  Jeffery Graves  is seen to proceed with left template salvage cryo for recurrent prostate cancer with obstructive voiding. NO interval fevers.    Past Medical History:  Diagnosis Date   BPH (benign prostatic hyperplasia)    Hx of radiation therapy 09/06/10 - 10/29/10   prostate, seminal vesicles   Hypertension    on meds   Prostate cancer (HCC) 04/26/2010   gleason 7, vol 20.88 cc    Past Surgical History:  Procedure Laterality Date   BIOPSY PROSTATE      Family History  Problem Relation Age of Onset   Stroke Mother    Colon cancer Neg Hx    Colon polyps Neg Hx    Esophageal cancer Neg Hx    Stomach cancer Neg Hx    Rectal cancer Neg Hx    Social History:  reports that he has never smoked. He has never used smokeless tobacco. He reports that he does not drink alcohol and does not use drugs.  Allergies: No Known Allergies  No medications prior to admission.    No results found for this or any  previous visit (from the past 48 hours). No results found.  Review of Systems  Constitutional:  Negative for chills and fever.  Genitourinary:  Positive for difficulty urinating and urgency.  All other systems reviewed and are negative.   There were no vitals taken for this visit. Physical Exam Vitals reviewed.  HENT:     Head: Normocephalic.  Eyes:     Pupils: Pupils are equal, round, and reactive to light.  Cardiovascular:     Rate and Rhythm: Normal rate.  Pulmonary:     Effort: Pulmonary effort is normal.  Abdominal:     General: Abdomen is flat.  Genitourinary:    Comments: No CVAT at present Musculoskeletal:        General: Normal range of motion.  Skin:    General: Skin is warm.  Neurological:     Mental Status: He is alert.  Psychiatric:        Mood and Affect: Mood normal.      Assessment/Plan  Proceed as planned with left side template (hocky stick) salvage cryo for locally recurrent prostate cancer. Risks, benefits, alternatives, expected peri-op course discussed prevoiusly and reiterated today.   Ricardo KATHEE Alvaro Mickey., MD 03/06/2024, 8:02 AM

## 2024-03-06 NOTE — Brief Op Note (Signed)
 03/06/2024  5:02 PM  PATIENT:  Jeffery Graves  68 y.o. male  PRE-OPERATIVE DIAGNOSIS:  RECURRENT PROSTATE CANCER  POST-OPERATIVE DIAGNOSIS:  RECURRENT PROSTATE CANCER  PROCEDURE:  Procedure(s): CRYOABLATION, PROSTATE, CYSTOSCOPY (N/A)  SURGEON:  Surgeons and Role:    * [name], [name]., MD - Primary  PHYSICIAN ASSISTANT:   ASSISTANTS: none   ANESTHESIA:   general  EBL:  2 mL   BLOOD ADMINISTERED:none  DRAINS: foley to gravity   LOCAL MEDICATIONS USED:  NONE  SPECIMEN:  No Specimen  DISPOSITION OF SPECIMEN:  N/A  COUNTS:  YES  TOURNIQUET:  * No tourniquets in log *  DICTATION: .Other Dictation: Dictation Number [id number]  PLAN OF CARE: Admit for overnight observation  PATIENT DISPOSITION:  PACU - hemodynamically stable.   Delay start of Pharmacological VTE agent (>24hrs) due to surgical blood loss or risk of bleeding: yes

## 2024-03-06 NOTE — Plan of Care (Signed)

## 2024-03-06 NOTE — Transfer of Care (Signed)
 Immediate Anesthesia Transfer of Care Note  Patient: Jeffery Graves  Procedure(s) Performed: CRYOABLATION, PROSTATE, CYSTOSCOPY  Patient Location: PACU  Anesthesia Type:General  Level of Consciousness: sedated  Airway & Oxygen Therapy: Patient Spontanous Breathing and Patient connected to face mask oxygen  Post-op Assessment: Report given to RN and Post -op Vital signs reviewed and stable  Post vital signs: Reviewed and stable  Last Vitals:  Vitals Value Taken Time  BP 162/83 03/06/24 17:03  Temp    Pulse 56 03/06/24 17:05  Resp 11 03/06/24 17:05  SpO2 100 % 03/06/24 17:05  Vitals shown include unfiled device data.  Last Pain:  Vitals:   03/06/24 1321  TempSrc: Oral         Complications: No notable events documented.

## 2024-03-06 NOTE — Anesthesia Procedure Notes (Signed)
 Procedure Name: LMA Insertion Date/Time: 03/06/2024 3:18 PM  Performed by: Cena Epps, CRNAPre-anesthesia Checklist: Patient identified, Emergency Drugs available, Suction available and Patient being monitored Patient Re-evaluated:Patient Re-evaluated prior to induction Oxygen Delivery Method: Circle System Utilized Preoxygenation: Pre-oxygenation with 100% oxygen Induction Type: IV induction Ventilation: Mask ventilation without difficulty LMA: LMA inserted LMA Size: 4.0 Number of attempts: 1 Airway Equipment and Method: Bite block Placement Confirmation: positive ETCO2 Tube secured with: Tape Dental Injury: Teeth and Oropharynx as per pre-operative assessment

## 2024-03-07 ENCOUNTER — Encounter (HOSPITAL_COMMUNITY): Payer: Self-pay | Admitting: Urology

## 2024-03-07 ENCOUNTER — Other Ambulatory Visit: Payer: Self-pay

## 2024-03-07 DIAGNOSIS — I10 Essential (primary) hypertension: Secondary | ICD-10-CM | POA: Diagnosis not present

## 2024-03-07 DIAGNOSIS — Z923 Personal history of irradiation: Secondary | ICD-10-CM | POA: Diagnosis not present

## 2024-03-07 DIAGNOSIS — C61 Malignant neoplasm of prostate: Secondary | ICD-10-CM | POA: Diagnosis not present

## 2024-03-07 LAB — HEMOGLOBIN AND HEMATOCRIT, BLOOD
HCT: 39.9 % (ref 39.0–52.0)
Hemoglobin: 12.8 g/dL — ABNORMAL LOW (ref 13.0–17.0)

## 2024-03-07 LAB — BASIC METABOLIC PANEL WITH GFR
Anion gap: 8 (ref 5–15)
BUN: 18 mg/dL (ref 8–23)
CO2: 23 mmol/L (ref 22–32)
Calcium: 8.6 mg/dL — ABNORMAL LOW (ref 8.9–10.3)
Chloride: 107 mmol/L (ref 98–111)
Creatinine, Ser: 0.88 mg/dL (ref 0.61–1.24)
GFR, Estimated: >60 mL/min (ref >60)
Glucose, Bld: 103 mg/dL — ABNORMAL HIGH (ref 70–99)
Potassium: 4 mmol/L (ref 3.5–5.1)
Sodium: 138 mmol/L (ref 135–145)

## 2024-03-07 MED ORDER — CEPHALEXIN 500 MG PO CAPS
500.0000 mg | ORAL_CAPSULE | Freq: Two times a day (BID) | ORAL | 0 refills | Status: DC
  Filled 2024-03-07: qty 8, 4d supply, fill #0

## 2024-03-07 MED ORDER — SENNOSIDES-DOCUSATE SODIUM 8.6-50 MG PO TABS
1.0000 | ORAL_TABLET | Freq: Two times a day (BID) | ORAL | 0 refills | Status: DC
  Filled 2024-03-07: qty 10, 5d supply, fill #0

## 2024-03-07 MED ORDER — TRAMADOL HCL 50 MG PO TABS
50.0000 mg | ORAL_TABLET | Freq: Four times a day (QID) | ORAL | 0 refills | Status: DC | PRN
  Filled 2024-03-07: qty 15, 4d supply, fill #0

## 2024-03-07 NOTE — Discharge Summary (Signed)
 Physician Discharge Summary  Patient ID: Jeffery Graves MRN: [id number] DOB/AGE: 1956/06/01 68 y.o.  Admit date: 03/06/2024 Discharge date: 03/07/2024  Admission Diagnoses: Prostate Cancer  Discharge Diagnoses:  Principal Problem:   Prostate cancer [facility])   Discharged Condition: good  Hospital Course: Pt underwent salvage prostate cryoablation 03/06/24 for left side locally recurrent prostate cancer after prior radiation. Observed on 4th floor Urology service post--op. By the early afternoon of POD 1, the day of discharge, he is ambulatory, pain controlled on PO meds, maintaining PO nutrition, and felt to be adequte for discharge. Hgb 12.8, Cr 0.88. He will go home with catheter.   Consults: None  Significant Diagnostic Studies: labs: as per above  Treatments: surgery: as per above  Discharge Exam: Blood pressure 134/81, pulse 72, temperature 98.4 F (36.9 C), temperature source Oral, resp. rate 16, height 5' 6 (1.676 m), weight 77.1 kg, SpO2 100%.  NAD, walking in the hallway Non-labotred breathign on RA Stable R>L eye erythema (chronic) RRR SNTND Foley in place with non-foul yellow urine No c/c/e  Disposition: HOME     Follow-up Information     Alvaro Ricardo KATHEE Mickey., MD Follow up on 03/11/2024.   Specialty: Urology Why: at 8:30 for MD visit and catheter removal. Contact information: [address] [place] [address] [phone]                 Signed: Ricardo KATHEE Alvaro Mickey. 03/07/2024, 2:15 PM

## 2024-03-07 NOTE — Op Note (Signed)
 NAME: Jeffery Graves, Jeffery Graves MEDICAL RECORD NO: [id number] ACCOUNT NO: [id number] DATE OF BIRTH: [birth year] FACILITY: [facility] LOCATION: [id number] PHYSICIAN: Ricardo Likens, MD  Operative Report   DATE OF PROCEDURE: 03/06/2024  PREOPERATIVE DIAGNOSIS: Recurrent prostate cancer.  PROCEDURE PERFORMED:  1.  Prostate cryoablation. 2.  Cystoscopy.  ESTIMATED BLOOD LOSS: Nil.  COMPLICATIONS: None.  SPECIMENS: None.  PREOPERATIVE FINDINGS:   1.  Unremarkable urethra, urinary bladder after cryoprobe insertion. 2. Two freeze-thaw cycles, excellent ice formation and a left-sided hockey stick formation as per plan.  3.  Approximately 13 mL prostate.   CRYOPROBE PARAMETERS: 4 probes, 2 temperature sensors.  PREOPERATIVE INDICATIONS: The patient is a very pleasant 68 year old man with a history of prostate cancer treated with primary radiation over a decade ago. He had initial very good biochemical control, but over time has had biochemical relapse. Staging  imaging was negative for distant disease. Repeat biopsy revealed local recurrence with several cores of the cancer nonviable in the left side of the prostate, which is not large volume at 120 g. He initially elected a path of medication management with  nonsteroidal antiandrogen, which has resulted in a good PSA response; however, this is non-curative. We have revisited the role of local salvage therapy several times, including salvage cryoablation as it may still offer him a chance at cure and minimize  need for the expensive oral oncolytics in the long-term. After consideration, he elected to proceed with local salvage therapy with cryoablation. Informed consent was obtained and placed in the medical record.  DESCRIPTION OF PROCEDURE:  The patient being himself verified, procedure being cystoscopy with ablation of the prostate was confirmed. Procedure timeout was performed.  Intravenous antibiotics administered.  General LMA anesthesia induced.  The  patient  was placed into a medium lithotomy position.  Sterile field was created by clipper shaving his perineum, prepped and draped the patient's penis, perineum and proximal thighs using iodine.  A Foley catheter was placed through the straight drain.  Transrectal ultrasound probe was introduced. Prostate volume was approximately 13 mL, and for a left hockey stick formation ablation, the plan called for 4 cryoprobes, each at very short distance of 15 mm or less. These were then placed transperineally  under ultrasound guidance in anterior to posterior direction along with temperature probes at the Denonvilliers fascia and the external sphincter, all under ultrasound guidance. The Foley catheter was then removed, and cystourethroscopy was performed  using a 16-French flexible cystoscope.   Cystourethroscopy was unremarkable of the anterior and posterior urethra.  Inspection of bladder revealed no diverticula, calcifications, papillary lesions.  Retroflexion showed no additional findings.  Notably, there was no evidence of foreign body or  cryoprobe tips within the bladder or the urethra. A Super Stiff wire was left in place over which the cryoablation urethral warming catheter was placed and anchored with a towel clip, and with continuous urethral warming, cryoablation was performed in an  anterior to posterior direction as per plan with urethral temperature probes never reaching temperatures lower than approximately 12 degrees. This was performed x2 freeze-thaw cycles, which showed an excellent ice formation at the left lateral and  slight hockey sticking to the midline on the left side as per plan. After the last thaw cycle, cryoprobes and temperature probes were removed. Perineal *** pressure was held for approximately 2 minutes, as well as excellent hemostasis. The urethral  warming catheter was removed, and a new 18-French Coude tip catheter was placed through the straight drain, 2 mL sterile water  in the balloon. Bacitracin  was placed over the perineal sites.  The procedure was terminated.  The patient tolerated the  procedure well.  No immediate periprocedural complications.  The patient was taken to Postanesthesia Care Unit in stable condition with plan for observation admission.      PAA D: 03/06/2024 5:08:54 pm T: 03/07/2024 4:00:00 am  JOB: [id number]/ [id number]

## 2024-03-07 NOTE — Plan of Care (Incomplete)
  Problem: Education: Goal: Knowledge of General Education information will improve Description: Including pain rating scale, medication(s)/side effects and non-pharmacologic comfort measures Outcome: Progressing   Problem: Health Behavior/Discharge Planning: Goal: Ability to manage health-related needs will improve Outcome: Progressing   Problem: Clinical Measurements: Goal: Will remain free from infection Outcome: Progressing Goal: Diagnostic test results will improve Outcome: Progressing   Problem: Elimination: Goal: Will not experience complications related to bowel motility Outcome: Progressing Goal: Will not experience complications related to urinary retention Outcome: Progressing   Problem: Pain Managment: Goal: General experience of comfort will improve and/or be controlled Outcome: Progressing   Problem: Urinary Elimination: Goal: Ability to avoid or minimize complications of infection will improve Outcome: Progressing   Problem: Clinical Measurements: Goal: Respiratory complications will improve Outcome: Adequate for Discharge Goal: Cardiovascular complication will be avoided Outcome: Adequate for Discharge   Problem: Activity: Goal: Risk for activity intolerance will decrease Outcome: Adequate for Discharge   Problem: Nutrition: Goal: Adequate nutrition will be maintained Outcome: Adequate for Discharge   Problem: Coping: Goal: Level of anxiety will decrease Outcome: Adequate for Discharge   Problem: Safety: Goal: Ability to remain free from injury will improve Outcome: Adequate for Discharge   Problem: Skin Integrity: Goal: Risk for impaired skin integrity will decrease Outcome: Adequate for Discharge   Problem: Skin Integrity: Goal: Demonstration of wound healing without infection will improve Outcome: Adequate for Discharge

## 2024-03-11 ENCOUNTER — Telehealth: Payer: Self-pay

## 2024-03-11 NOTE — Transitions of Care (Post Inpatient/ED Visit) (Signed)
   03/11/2024  Name: Jeffery Graves MRN: [id number] DOB: 1956/03/21  Today's TOC FU Call Status: Today's TOC FU Call Status:: Successful TOC FU Call Completed TOC FU Call Complete Date: 03/11/24 Patient's Name and Date of Birth confirmed.  Transition Care Management Follow-up Telephone Call Date of Discharge: 03/07/24 Discharge Facility: [facility]) Type of Discharge: Inpatient Admission Primary Inpatient Discharge Diagnosis:: prostate cancer How have you been since you were released from the hospital?: Better (He stated he is feeling really good. He said he had the foley catheter removed this morning) Any questions or concerns?: No  Items Reviewed: Did you receive and understand the discharge instructions provided?: Yes Medications obtained,verified, and reconciled?: Yes (Medications Reviewed) (He has all of his medications and did not have any questions about the med regime.) Any new allergies since your discharge?: No Dietary orders reviewed?: Yes Type of Diet Ordered:: heart healthy, low sodium Do you have support at home?: Yes People in Home [RPT]: alone Name of Support/Comfort Primary Source: He has a niece he can contact if needed  Medications Reviewed Today: Medications Reviewed Today     Reviewed by Marvis Bradley, RN (Case Manager) on 03/11/24 at 1137  Med List Status: <None>   Medication Order Taking? Sig Documenting Provider Last Dose Status Informant  Brimonidine  Tartrate (LUMIFY  OP) [id number] No Place 1 drop into both eyes daily. [provider] 03/05/2024 Active   cephALEXin  (KEFLEX ) 500 MG capsule [id number]  Take 1 capsule (500 mg total) by mouth 2 (two) times daily. X 4 days to prevent infection with catheter Alvaro Ricardo KATHEE Mickey., MD  Active   senna-docusate (SENOKOT-S) 8.6-50 MG tablet [id number]  Take 1 tablet by mouth 2 (two) times daily. While taking pain meds to prevent constipation Alvaro Ricardo KATHEE Mickey., MD  Active   tamsulosin  (FLOMAX ) 0.4 MG CAPS  capsule [id number] No Take 1 capsule (0.4 mg total) by mouth 2 (two) times daily.  03/06/2024  5:30 AM Active Self  traMADol  (ULTRAM ) 50 MG tablet [id number]  Take 1 tablet (50 mg total) by mouth every 6 (six) hours as needed for moderate pain (pain score 4-6) (post-operatively). Alvaro Ricardo KATHEE Mickey., MD  Active   valsartan -hydrochlorothiazide  (DIOVAN -HCT) 160-25 MG tablet [id number] No Take 1 tablet by mouth daily. Newlin, Enobong, MD 03/05/2024 Active   XTANDI 80 MG tablet [id number] No Take 80 mg by mouth daily. [provider] 03/06/2024  5:30 AM Active             Home Care and Equipment/Supplies: Were Home Health Services Ordered?: No Any new equipment or medical supplies ordered?: No  Functional Questionnaire: Do you need assistance with bathing/showering or dressing?: No Do you need assistance with meal preparation?: No Do you need assistance with eating?: No Do you have difficulty maintaining continence: No Do you need assistance with getting out of bed/getting out of a chair/moving?: No  Follow up appointments reviewed: PCP Follow-up appointment confirmed?: Yes Date of PCP follow-up appointment?: 08/14/24 Follow-up Provider: [facility] Follow-up appointment confirmed?: Yes Date of Specialist follow-up appointment?: 03/11/24 Follow-Up Specialty Provider:: Urology- he was seen this morning and had his catheter removed. Do you need transportation to your follow-up appointment?: No Do you understand care options if your condition(s) worsen?: Yes-patient verbalized understanding    SIGNATURE Bradley Marvis, RN

## 2024-03-15 ENCOUNTER — Emergency Department (HOSPITAL_COMMUNITY)

## 2024-03-15 ENCOUNTER — Inpatient Hospital Stay (HOSPITAL_COMMUNITY)

## 2024-03-15 ENCOUNTER — Inpatient Hospital Stay (HOSPITAL_COMMUNITY)
Admission: EM | Admit: 2024-03-15 | Discharge: 2024-03-19 | DRG: 321 | Disposition: A | Discharge status: Home Health | Attending: Internal Medicine | Admitting: Internal Medicine

## 2024-03-15 ENCOUNTER — Ambulatory Visit (HOSPITAL_COMMUNITY): Admit: 2024-03-15 | Admitting: Cardiology

## 2024-03-15 ENCOUNTER — Encounter (HOSPITAL_COMMUNITY)
Admission: EM | Disposition: A | Discharge status: Home Health | Payer: Self-pay | Source: Home / Self Care | Attending: Internal Medicine

## 2024-03-15 ENCOUNTER — Other Ambulatory Visit (HOSPITAL_COMMUNITY): Payer: Self-pay

## 2024-03-15 ENCOUNTER — Telehealth (HOSPITAL_COMMUNITY): Payer: Self-pay | Admitting: Pharmacy Technician

## 2024-03-15 DIAGNOSIS — G3184 Mild cognitive impairment, so stated: Secondary | ICD-10-CM | POA: Diagnosis present

## 2024-03-15 DIAGNOSIS — Z8546 Personal history of malignant neoplasm of prostate: Secondary | ICD-10-CM

## 2024-03-15 DIAGNOSIS — I2119 ST elevation (STEMI) myocardial infarction involving other coronary artery of inferior wall: Secondary | ICD-10-CM | POA: Diagnosis not present

## 2024-03-15 DIAGNOSIS — I462 Cardiac arrest due to underlying cardiac condition: Secondary | ICD-10-CM | POA: Diagnosis present

## 2024-03-15 DIAGNOSIS — I472 Ventricular tachycardia, unspecified: Secondary | ICD-10-CM | POA: Diagnosis present

## 2024-03-15 DIAGNOSIS — H269 Unspecified cataract: Secondary | ICD-10-CM | POA: Diagnosis not present

## 2024-03-15 DIAGNOSIS — I2111 ST elevation (STEMI) myocardial infarction involving right coronary artery: Secondary | ICD-10-CM | POA: Diagnosis not present

## 2024-03-15 DIAGNOSIS — K7200 Acute and subacute hepatic failure without coma: Secondary | ICD-10-CM | POA: Diagnosis not present

## 2024-03-15 DIAGNOSIS — I469 Cardiac arrest, cause unspecified: Secondary | ICD-10-CM | POA: Diagnosis not present

## 2024-03-15 DIAGNOSIS — E876 Hypokalemia: Secondary | ICD-10-CM | POA: Diagnosis not present

## 2024-03-15 DIAGNOSIS — R7401 Elevation of levels of liver transaminase levels: Secondary | ICD-10-CM

## 2024-03-15 DIAGNOSIS — R310 Gross hematuria: Secondary | ICD-10-CM | POA: Diagnosis not present

## 2024-03-15 DIAGNOSIS — I2510 Atherosclerotic heart disease of native coronary artery without angina pectoris: Secondary | ICD-10-CM | POA: Diagnosis present

## 2024-03-15 DIAGNOSIS — G9389 Other specified disorders of brain: Secondary | ICD-10-CM | POA: Diagnosis not present

## 2024-03-15 DIAGNOSIS — I255 Ischemic cardiomyopathy: Secondary | ICD-10-CM | POA: Diagnosis present

## 2024-03-15 DIAGNOSIS — Z79899 Other long term (current) drug therapy: Secondary | ICD-10-CM

## 2024-03-15 DIAGNOSIS — I4901 Ventricular fibrillation: Secondary | ICD-10-CM | POA: Diagnosis not present

## 2024-03-15 DIAGNOSIS — Z823 Family history of stroke: Secondary | ICD-10-CM

## 2024-03-15 DIAGNOSIS — J984 Other disorders of lung: Secondary | ICD-10-CM | POA: Diagnosis not present

## 2024-03-15 DIAGNOSIS — I4510 Unspecified right bundle-branch block: Secondary | ICD-10-CM | POA: Diagnosis not present

## 2024-03-15 DIAGNOSIS — I517 Cardiomegaly: Secondary | ICD-10-CM | POA: Diagnosis not present

## 2024-03-15 DIAGNOSIS — R918 Other nonspecific abnormal finding of lung field: Secondary | ICD-10-CM | POA: Diagnosis not present

## 2024-03-15 DIAGNOSIS — J9601 Acute respiratory failure with hypoxia: Secondary | ICD-10-CM | POA: Diagnosis not present

## 2024-03-15 DIAGNOSIS — R404 Transient alteration of awareness: Secondary | ICD-10-CM | POA: Diagnosis not present

## 2024-03-15 DIAGNOSIS — Z7982 Long term (current) use of aspirin: Secondary | ICD-10-CM | POA: Diagnosis not present

## 2024-03-15 DIAGNOSIS — I110 Hypertensive heart disease with heart failure: Secondary | ICD-10-CM | POA: Diagnosis present

## 2024-03-15 DIAGNOSIS — Z9889 Other specified postprocedural states: Secondary | ICD-10-CM

## 2024-03-15 DIAGNOSIS — D72829 Elevated white blood cell count, unspecified: Secondary | ICD-10-CM | POA: Diagnosis not present

## 2024-03-15 DIAGNOSIS — I5021 Acute systolic (congestive) heart failure: Secondary | ICD-10-CM | POA: Diagnosis not present

## 2024-03-15 DIAGNOSIS — G931 Anoxic brain damage, not elsewhere classified: Secondary | ICD-10-CM | POA: Diagnosis present

## 2024-03-15 DIAGNOSIS — I213 ST elevation (STEMI) myocardial infarction of unspecified site: Secondary | ICD-10-CM | POA: Diagnosis present

## 2024-03-15 DIAGNOSIS — R739 Hyperglycemia, unspecified: Secondary | ICD-10-CM | POA: Diagnosis present

## 2024-03-15 DIAGNOSIS — Z7902 Long term (current) use of antithrombotics/antiplatelets: Secondary | ICD-10-CM | POA: Diagnosis not present

## 2024-03-15 DIAGNOSIS — I219 Acute myocardial infarction, unspecified: Secondary | ICD-10-CM | POA: Diagnosis not present

## 2024-03-15 DIAGNOSIS — Z923 Personal history of irradiation: Secondary | ICD-10-CM

## 2024-03-15 DIAGNOSIS — Z4682 Encounter for fitting and adjustment of non-vascular catheter: Secondary | ICD-10-CM | POA: Diagnosis not present

## 2024-03-15 DIAGNOSIS — N400 Enlarged prostate without lower urinary tract symptoms: Secondary | ICD-10-CM | POA: Diagnosis present

## 2024-03-15 DIAGNOSIS — R0681 Apnea, not elsewhere classified: Secondary | ICD-10-CM | POA: Diagnosis not present

## 2024-03-15 DIAGNOSIS — Z955 Presence of coronary angioplasty implant and graft: Secondary | ICD-10-CM

## 2024-03-15 DIAGNOSIS — S2231XA Fracture of one rib, right side, initial encounter for closed fracture: Secondary | ICD-10-CM | POA: Diagnosis not present

## 2024-03-15 DIAGNOSIS — I493 Ventricular premature depolarization: Secondary | ICD-10-CM | POA: Diagnosis present

## 2024-03-15 DIAGNOSIS — C61 Malignant neoplasm of prostate: Secondary | ICD-10-CM | POA: Diagnosis not present

## 2024-03-15 DIAGNOSIS — G9340 Encephalopathy, unspecified: Secondary | ICD-10-CM | POA: Diagnosis not present

## 2024-03-15 HISTORY — PX: CORONARY/GRAFT ACUTE MI REVASCULARIZATION: CATH118305

## 2024-03-15 LAB — COMPREHENSIVE METABOLIC PANEL WITH GFR
ALT: 111 U/L — ABNORMAL HIGH (ref 0–44)
AST: 132 U/L — ABNORMAL HIGH (ref 15–41)
Albumin: 3.5 g/dL (ref 3.5–5.0)
Alkaline Phosphatase: 86 U/L (ref 38–126)
Anion gap: 14 (ref 5–15)
BUN: 15 mg/dL (ref 8–23)
CO2: 21 mmol/L — ABNORMAL LOW (ref 22–32)
Calcium: 8.6 mg/dL — ABNORMAL LOW (ref 8.9–10.3)
Chloride: 100 mmol/L (ref 98–111)
Creatinine, Ser: 1.04 mg/dL (ref 0.61–1.24)
GFR, Estimated: >60 mL/min (ref >60)
Glucose, Bld: 212 mg/dL — ABNORMAL HIGH (ref 70–99)
Potassium: 3.9 mmol/L (ref 3.5–5.1)
Sodium: 135 mmol/L (ref 135–145)
Total Bilirubin: 1.1 mg/dL (ref 0.0–1.2)
Total Protein: 7 g/dL (ref 6.5–8.1)

## 2024-03-15 LAB — LIPID PANEL
Cholesterol: 164 mg/dL (ref 0–200)
HDL: 52 mg/dL (ref >40)
LDL Cholesterol: 104 mg/dL — ABNORMAL HIGH (ref 0–99)
Total CHOL/HDL Ratio: 3.2 ratio
Triglycerides: 39 mg/dL (ref <150)
VLDL: 8 mg/dL (ref 0–40)

## 2024-03-15 LAB — HEMOGLOBIN A1C
Hgb A1c MFr Bld: 5.2 % (ref 4.8–5.6)
Mean Plasma Glucose: 102.54 mg/dL

## 2024-03-15 LAB — TROPONIN I (HIGH SENSITIVITY)
Troponin I (High Sensitivity): 1520 ng/L (ref <18)
Troponin I (High Sensitivity): 4420 ng/L (ref <18)

## 2024-03-15 LAB — POCT I-STAT 7, (LYTES, BLD GAS, ICA,H+H)
Acid-base deficit: 3 mmol/L — ABNORMAL HIGH (ref 0.0–2.0)
Bicarbonate: 21.9 mmol/L (ref 20.0–28.0)
Calcium, Ion: 1.12 mmol/L — ABNORMAL LOW (ref 1.15–1.40)
HCT: 39 % (ref 39.0–52.0)
Hemoglobin: 13.3 g/dL (ref 13.0–17.0)
O2 Saturation: 100 %
Potassium: 2.4 mmol/L — CL (ref 3.5–5.1)
Sodium: 136 mmol/L (ref 135–145)
TCO2: 23 mmol/L (ref 22–32)
pCO2 arterial: 37.9 mmHg (ref 32–48)
pH, Arterial: 7.371 (ref 7.35–7.45)
pO2, Arterial: 450 mmHg — ABNORMAL HIGH (ref 83–108)

## 2024-03-15 LAB — CBC WITH DIFFERENTIAL/PLATELET
Abs Immature Granulocytes: 0.85 K/uL — ABNORMAL HIGH (ref 0.00–0.07)
Basophils Absolute: 0.1 K/uL (ref 0.0–0.1)
Basophils Relative: 0 %
Eosinophils Absolute: 0.2 K/uL (ref 0.0–0.5)
Eosinophils Relative: 1 %
HCT: 41.9 % (ref 39.0–52.0)
Hemoglobin: 13.4 g/dL (ref 13.0–17.0)
Immature Granulocytes: 5 %
Lymphocytes Relative: 17 %
Lymphs Abs: 2.9 K/uL (ref 0.7–4.0)
MCH: 26.8 pg (ref 26.0–34.0)
MCHC: 32 g/dL (ref 30.0–36.0)
MCV: 83.8 fL (ref 80.0–100.0)
Monocytes Absolute: 1 K/uL (ref 0.1–1.0)
Monocytes Relative: 6 %
Neutro Abs: 12 K/uL — ABNORMAL HIGH (ref 1.7–7.7)
Neutrophils Relative %: 71 %
Platelets: 213 K/uL (ref 150–400)
RBC: 5 MIL/uL (ref 4.22–5.81)
RDW: 13.7 % (ref 11.5–15.5)
WBC: 17 K/uL — ABNORMAL HIGH (ref 4.0–10.5)
nRBC: 0 % (ref 0.0–0.2)

## 2024-03-15 LAB — GLUCOSE, CAPILLARY
Glucose-Capillary: 101 mg/dL — ABNORMAL HIGH (ref 70–99)
Glucose-Capillary: 120 mg/dL — ABNORMAL HIGH (ref 70–99)

## 2024-03-15 LAB — RAPID URINE DRUG SCREEN, HOSP PERFORMED
Amphetamines: NOT DETECTED
Barbiturates: NOT DETECTED
Benzodiazepines: NOT DETECTED
Cocaine: NOT DETECTED
Opiates: NOT DETECTED
Tetrahydrocannabinol: NOT DETECTED

## 2024-03-15 LAB — POCT ACTIVATED CLOTTING TIME
Activated Clotting Time: 170 s
Activated Clotting Time: 349 s

## 2024-03-15 LAB — APTT: aPTT: 30 s (ref 24–36)

## 2024-03-15 LAB — BASIC METABOLIC PANEL WITH GFR
Anion gap: 15 (ref 5–15)
BUN: 12 mg/dL (ref 8–23)
CO2: 23 mmol/L (ref 22–32)
Calcium: 8.7 mg/dL — ABNORMAL LOW (ref 8.9–10.3)
Chloride: 97 mmol/L — ABNORMAL LOW (ref 98–111)
Creatinine, Ser: 0.88 mg/dL (ref 0.61–1.24)
GFR, Estimated: >60 mL/min (ref >60)
Glucose, Bld: 100 mg/dL — ABNORMAL HIGH (ref 70–99)
Potassium: 2.9 mmol/L — ABNORMAL LOW (ref 3.5–5.1)
Sodium: 135 mmol/L (ref 135–145)

## 2024-03-15 LAB — CBG MONITORING, ED: Glucose-Capillary: 232 mg/dL — ABNORMAL HIGH (ref 70–99)

## 2024-03-15 LAB — PROTIME-INR
INR: 1.2 (ref 0.8–1.2)
Prothrombin Time: 15.7 s — ABNORMAL HIGH (ref 11.4–15.2)

## 2024-03-15 LAB — MAGNESIUM
Magnesium: 1.9 mg/dL (ref 1.7–2.4)
Magnesium: 2.1 mg/dL (ref 1.7–2.4)

## 2024-03-15 LAB — MRSA NEXT GEN BY PCR, NASAL: MRSA by PCR Next Gen: NOT DETECTED

## 2024-03-15 LAB — I-STAT CG4 LACTIC ACID, ED: Lactic Acid, Venous: 5.1 mmol/L (ref 0.5–1.9)

## 2024-03-15 LAB — CG4 I-STAT (LACTIC ACID): Lactic Acid, Venous: 1.7 mmol/L (ref 0.5–1.9)

## 2024-03-15 LAB — TSH: TSH: 3.514 u[IU]/mL (ref 0.350–4.500)

## 2024-03-15 SURGERY — CORONARY/GRAFT ACUTE MI REVASCULARIZATION
Anesthesia: LOCAL

## 2024-03-15 MED ORDER — TICAGRELOR 90 MG PO TABS
90.0000 mg | ORAL_TABLET | Freq: Two times a day (BID) | ORAL | Status: DC
  Administered 2024-03-15: 90 mg
  Filled 2024-03-15: qty 1

## 2024-03-15 MED ORDER — NOREPINEPHRINE 4 MG/250ML-% IV SOLN
INTRAVENOUS | Status: AC
  Filled 2024-03-15: qty 250

## 2024-03-15 MED ORDER — DEXMEDETOMIDINE HCL IN NACL 400 MCG/100ML IV SOLN
0.0000 ug/kg/h | INTRAVENOUS | Status: DC
  Administered 2024-03-15: 0.4 ug/kg/h via INTRAVENOUS

## 2024-03-15 MED ORDER — FAMOTIDINE 20 MG PO TABS: 20.0000 mg | ORAL_TABLET | Freq: Two times a day (BID) | ORAL | Status: DC

## 2024-03-15 MED ORDER — LIDOCAINE HCL (PF) 1 % IJ SOLN
INTRAMUSCULAR | Status: DC | PRN
  Administered 2024-03-15: 2 mL

## 2024-03-15 MED ORDER — SODIUM CHLORIDE 0.9% FLUSH
3.0000 mL | Freq: Two times a day (BID) | INTRAVENOUS | Status: DC
  Administered 2024-03-15 – 2024-03-19 (×7): 3 mL via INTRAVENOUS

## 2024-03-15 MED ORDER — AMIODARONE LOAD VIA INFUSION
150.0000 mg | Freq: Once | INTRAVENOUS | Status: AC
  Administered 2024-03-15: 150 mg via INTRAVENOUS
  Filled 2024-03-15: qty 83.34

## 2024-03-15 MED ORDER — POLYETHYLENE GLYCOL 3350 17 G PO PACK
17.0000 g | PACK | Freq: Every day | ORAL | Status: DC
  Filled 2024-03-15: qty 1

## 2024-03-15 MED ORDER — MIDAZOLAM-SODIUM CHLORIDE 100-0.9 MG/100ML-% IV SOLN
INTRAVENOUS | Status: DC
Start: 2024-03-15 — End: 2024-03-15
  Filled 2024-03-15: qty 100

## 2024-03-15 MED ORDER — POTASSIUM CHLORIDE 10 MEQ/100ML IV SOLN
INTRAVENOUS | Status: DC | PRN
  Administered 2024-03-15: 10 meq via INTRAVENOUS

## 2024-03-15 MED ORDER — PROPOFOL 1000 MG/100ML IV EMUL
0.0000 ug/kg/min | INTRAVENOUS | Status: DC
  Administered 2024-03-15: 20 ug/kg/min via INTRAVENOUS
  Filled 2024-03-15: qty 100

## 2024-03-15 MED ORDER — AMIODARONE IV BOLUS ONLY 150 MG/100ML: 150.0000 mg | Freq: Once | INTRAVENOUS | Status: DC

## 2024-03-15 MED ORDER — VERAPAMIL HCL 2.5 MG/ML IV SOLN
INTRAVENOUS | Status: DC | PRN
  Administered 2024-03-15: 10 mL via INTRA_ARTERIAL

## 2024-03-15 MED ORDER — POTASSIUM CHLORIDE 10 MEQ/100ML IV SOLN
10.0000 meq | INTRAVENOUS | Status: AC
  Administered 2024-03-15 – 2024-03-16 (×8): 10 meq via INTRAVENOUS
  Filled 2024-03-15 (×7): qty 100

## 2024-03-15 MED ORDER — MIDAZOLAM HCL 2 MG/2ML IJ SOLN
INTRAMUSCULAR | Status: AC
  Administered 2024-03-15: 4 mg via INTRAVENOUS
  Filled 2024-03-15: qty 4

## 2024-03-15 MED ORDER — HEPARIN SODIUM (PORCINE) 1000 UNIT/ML IJ SOLN
INTRAMUSCULAR | Status: AC
  Filled 2024-03-15: qty 10

## 2024-03-15 MED ORDER — FENTANYL CITRATE PF 50 MCG/ML IJ SOSY: 25.0000 ug | PREFILLED_SYRINGE | INTRAMUSCULAR | Status: DC | PRN

## 2024-03-15 MED ORDER — ACETAMINOPHEN 325 MG PO TABS
650.0000 mg | ORAL_TABLET | ORAL | Status: DC | PRN
Start: 2024-03-15 — End: 2024-03-15

## 2024-03-15 MED ORDER — TICAGRELOR 90 MG PO TABS
ORAL_TABLET | ORAL | Status: DC | PRN
  Administered 2024-03-15: 180 mg

## 2024-03-15 MED ORDER — HEPARIN SODIUM (PORCINE) 1000 UNIT/ML IJ SOLN
INTRAMUSCULAR | Status: DC | PRN
  Administered 2024-03-15: 4000 [IU] via INTRAVENOUS

## 2024-03-15 MED ORDER — NITROGLYCERIN 1 MG/10 ML FOR IR/CATH LAB
INTRA_ARTERIAL | Status: DC
Start: 2024-03-15 — End: 2024-03-15
  Filled 2024-03-15: qty 10

## 2024-03-15 MED ORDER — LEVETIRACETAM (KEPPRA) 500 MG/5 ML ADULT IV PUSH
4500.0000 mg | Freq: Once | INTRAVENOUS | Status: AC
  Administered 2024-03-15: 4500 mg via INTRAVENOUS
  Filled 2024-03-15: qty 45

## 2024-03-15 MED ORDER — ONDANSETRON HCL 4 MG/2ML IJ SOLN: 4.0000 mg | Freq: Four times a day (QID) | INTRAMUSCULAR | Status: DC | PRN

## 2024-03-15 MED ORDER — LIDOCAINE HCL (PF) 1 % IJ SOLN
INTRAMUSCULAR | Status: AC
  Filled 2024-03-15: qty 30

## 2024-03-15 MED ORDER — MAGNESIUM SULFATE 2 GM/50ML IV SOLN
2.0000 g | Freq: Once | INTRAVENOUS | Status: AC
  Administered 2024-03-15: 2 g via INTRAVENOUS
  Filled 2024-03-15: qty 50

## 2024-03-15 MED ORDER — ROCURONIUM BROMIDE 10 MG/ML (PF) SYRINGE
100.0000 mg | PREFILLED_SYRINGE | Freq: Once | INTRAVENOUS | Status: AC
  Administered 2024-03-15: 100 mg via INTRAVENOUS

## 2024-03-15 MED ORDER — AMIODARONE HCL IN DEXTROSE 360-4.14 MG/200ML-% IV SOLN
30.0000 mg/h | INTRAVENOUS | Status: DC
  Filled 2024-03-15: qty 200

## 2024-03-15 MED ORDER — MIDAZOLAM HCL 2 MG/2ML IJ SOLN: 4.0000 mg | Freq: Once | INTRAMUSCULAR | Status: AC

## 2024-03-15 MED ORDER — SODIUM CHLORIDE 0.9% FLUSH: 3.0000 mL | INTRAVENOUS | Status: DC | PRN

## 2024-03-15 MED ORDER — ORAL CARE MOUTH RINSE
15.0000 mL | OROMUCOSAL | Status: DC
  Administered 2024-03-15 (×2): 15 mL via OROMUCOSAL

## 2024-03-15 MED ORDER — HEPARIN SODIUM (PORCINE) 5000 UNIT/ML IJ SOLN
INTRAMUSCULAR | Status: AC
  Administered 2024-03-15: 4000 [IU] via INTRAVENOUS
  Filled 2024-03-15: qty 1

## 2024-03-15 MED ORDER — TICAGRELOR 90 MG PO TABS
ORAL_TABLET | ORAL | Status: AC
  Filled 2024-03-15: qty 2

## 2024-03-15 MED ORDER — IOHEXOL 350 MG/ML SOLN
INTRAVENOUS | Status: DC | PRN
  Administered 2024-03-15: 75 mL via INTRA_ARTERIAL

## 2024-03-15 MED ORDER — AMIODARONE HCL IN DEXTROSE 360-4.14 MG/200ML-% IV SOLN
60.0000 mg/h | INTRAVENOUS | Status: DC
  Administered 2024-03-15: 60 mg/h via INTRAVENOUS
  Filled 2024-03-15: qty 200

## 2024-03-15 MED ORDER — TICAGRELOR 90 MG PO TABS
90.0000 mg | ORAL_TABLET | Freq: Two times a day (BID) | ORAL | Status: DC
  Administered 2024-03-16: 90 mg via ORAL
  Filled 2024-03-15: qty 1

## 2024-03-15 MED ORDER — POLYETHYLENE GLYCOL 3350 17 G PO PACK
17.0000 g | PACK | Freq: Every day | ORAL | Status: DC
  Administered 2024-03-17: 17 g via ORAL
  Filled 2024-03-15: qty 1

## 2024-03-15 MED ORDER — DOCUSATE SODIUM 50 MG/5ML PO LIQD
100.0000 mg | Freq: Two times a day (BID) | ORAL | Status: DC
  Administered 2024-03-16: 100 mg via ORAL
  Filled 2024-03-15: qty 10

## 2024-03-15 MED ORDER — INSULIN ASPART 100 UNIT/ML IJ SOLN
0.0000 [IU] | INTRAMUSCULAR | Status: DC
  Administered 2024-03-15 – 2024-03-16 (×2): 2 [IU] via SUBCUTANEOUS

## 2024-03-15 MED ORDER — ASPIRIN 81 MG PO CHEW
81.0000 mg | CHEWABLE_TABLET | Freq: Every day | ORAL | Status: DC
  Administered 2024-03-16 – 2024-03-19 (×4): 81 mg via ORAL
  Filled 2024-03-15 (×4): qty 1

## 2024-03-15 MED ORDER — HEPARIN (PORCINE) IN NACL 1000-0.9 UT/500ML-% IV SOLN
INTRAVENOUS | Status: DC | PRN
Start: 2024-03-15 — End: 2024-03-15
  Administered 2024-03-15: 1000 mL via SURGICAL_CAVITY

## 2024-03-15 MED ORDER — LABETALOL HCL 5 MG/ML IV SOLN: 10.0000 mg | INTRAVENOUS | Status: AC | PRN

## 2024-03-15 MED ORDER — ASPIRIN 81 MG PO CHEW: 81.0000 mg | CHEWABLE_TABLET | Freq: Every day | ORAL | Status: DC

## 2024-03-15 MED ORDER — TICAGRELOR 90 MG PO TABS: 90.0000 mg | ORAL_TABLET | Freq: Two times a day (BID) | ORAL | Status: DC

## 2024-03-15 MED ORDER — HYDRALAZINE HCL 20 MG/ML IJ SOLN: 10.0000 mg | INTRAMUSCULAR | Status: AC | PRN

## 2024-03-15 MED ORDER — SODIUM CHLORIDE 0.9 % IV SOLN: INTRAVENOUS | Status: AC

## 2024-03-15 MED ORDER — PROPOFOL 1000 MG/100ML IV EMUL
INTRAVENOUS | Status: AC
  Filled 2024-03-15: qty 100

## 2024-03-15 MED ORDER — MIDAZOLAM HCL 2 MG/2ML IJ SOLN: 2.0000 mg | Freq: Once | INTRAMUSCULAR | Status: DC | PRN

## 2024-03-15 MED ORDER — ASPIRIN 300 MG RE SUPP
300.0000 mg | Freq: Once | RECTAL | Status: AC
  Administered 2024-03-15: 300 mg via RECTAL
  Filled 2024-03-15: qty 1

## 2024-03-15 MED ORDER — ORAL CARE MOUTH RINSE: 15.0000 mL | OROMUCOSAL | Status: DC | PRN

## 2024-03-15 MED ORDER — ENOXAPARIN SODIUM 40 MG/0.4ML IJ SOSY: 40.0000 mg | PREFILLED_SYRINGE | INTRAMUSCULAR | Status: DC

## 2024-03-15 MED ORDER — ETOMIDATE 2 MG/ML IV SOLN
20.0000 mg | Freq: Once | INTRAVENOUS | Status: AC
  Administered 2024-03-15: 20 mg via INTRAVENOUS

## 2024-03-15 MED ORDER — SODIUM CHLORIDE 0.9 % IV SOLN: 250.0000 mL | INTRAVENOUS | Status: AC | PRN

## 2024-03-15 MED ORDER — HEPARIN SODIUM (PORCINE) 5000 UNIT/ML IJ SOLN
4000.0000 [IU] | Freq: Once | INTRAMUSCULAR | Status: AC
  Filled 2024-03-15: qty 0.8

## 2024-03-15 MED ORDER — FAMOTIDINE 20 MG PO TABS
20.0000 mg | ORAL_TABLET | Freq: Two times a day (BID) | ORAL | Status: DC
  Administered 2024-03-16: 20 mg via ORAL
  Filled 2024-03-15: qty 1

## 2024-03-15 MED ORDER — CHLORHEXIDINE GLUCONATE CLOTH 2 % EX PADS
6.0000 | MEDICATED_PAD | Freq: Every day | CUTANEOUS | Status: DC
  Administered 2024-03-15 – 2024-03-19 (×5): 6 via TOPICAL

## 2024-03-15 MED ORDER — VERAPAMIL HCL 2.5 MG/ML IV SOLN
INTRAVENOUS | Status: AC
  Filled 2024-03-15: qty 2

## 2024-03-15 MED ORDER — AMIODARONE HCL IN DEXTROSE 360-4.14 MG/200ML-% IV SOLN
60.0000 mg/h | INTRAVENOUS | Status: DC
  Administered 2024-03-15 – 2024-03-16 (×3): 60 mg/h via INTRAVENOUS
  Filled 2024-03-15 (×3): qty 200

## 2024-03-15 MED ORDER — MIDAZOLAM HCL 2 MG/2ML IJ SOLN
INTRAMUSCULAR | Status: AC
  Filled 2024-03-15: qty 2

## 2024-03-15 MED ORDER — ACETAMINOPHEN 325 MG PO TABS
650.0000 mg | ORAL_TABLET | ORAL | Status: DC | PRN
  Administered 2024-03-16 – 2024-03-17 (×4): 650 mg via ORAL
  Filled 2024-03-15 (×4): qty 2

## 2024-03-15 MED ORDER — DEXMEDETOMIDINE HCL IN NACL 400 MCG/100ML IV SOLN
INTRAVENOUS | Status: AC
Start: 2024-03-15 — End: 2024-03-16
  Filled 2024-03-15: qty 100

## 2024-03-15 MED ORDER — DOCUSATE SODIUM 50 MG/5ML PO LIQD: 100.0000 mg | Freq: Two times a day (BID) | ORAL | Status: DC

## 2024-03-15 MED ORDER — POTASSIUM CHLORIDE 10 MEQ/100ML IV SOLN: 10.0000 meq | Freq: Once | INTRAVENOUS | Status: AC

## 2024-03-15 MED ORDER — FENTANYL 2500MCG IN NS 250ML (10MCG/ML) PREMIX INFUSION
INTRAVENOUS | Status: AC
  Filled 2024-03-15: qty 250

## 2024-03-15 MED ORDER — ATORVASTATIN CALCIUM 80 MG PO TABS
80.0000 mg | ORAL_TABLET | Freq: Every day | ORAL | Status: DC
  Administered 2024-03-16 – 2024-03-19 (×4): 80 mg via ORAL
  Filled 2024-03-15 (×4): qty 1

## 2024-03-15 MED ORDER — ORAL CARE MOUTH RINSE
15.0000 mL | Freq: Three times a day (TID) | OROMUCOSAL | Status: DC
  Administered 2024-03-15 – 2024-03-17 (×3): 15 mL via OROMUCOSAL

## 2024-03-15 MED ORDER — ATORVASTATIN CALCIUM 80 MG PO TABS: 80.0000 mg | ORAL_TABLET | Freq: Every day | ORAL | Status: DC

## 2024-03-15 SURGICAL SUPPLY — 16 items
BALLOON EMERGE MR 2.0X12 (BALLOONS) IMPLANT
BALLOON ~~LOC~~ EMERGE MR 2.5X12 (BALLOONS) IMPLANT
CATH INFINITI AMBI 5FR TG (CATHETERS) IMPLANT
CATH VISTA GUIDE 6FR JR4 ECOPK (CATHETERS) IMPLANT
DEVICE RAD COMP TR BAND LRG (VASCULAR PRODUCTS) IMPLANT
ELECT DEFIB PAD ADLT CADENCE (PAD) IMPLANT
GLIDESHEATH SLEND A-KIT 6F 22G (SHEATH) IMPLANT
GUIDEWIRE INQWIRE 1.5J.035X260 (WIRE) IMPLANT
KIT ENCORE 26 ADVANTAGE (KITS) IMPLANT
KIT HEMO VALVE WATCHDOG (MISCELLANEOUS) IMPLANT
KIT SINGLE USE MANIFOLD (KITS) IMPLANT
MAT PREVALON FULL STRYKER (MISCELLANEOUS) IMPLANT
PACK CARDIAC CATHETERIZATION (CUSTOM PROCEDURE TRAY) ×1 IMPLANT
SET ATX-X65L (MISCELLANEOUS) IMPLANT
STENT SYNERGY XD 2.25X20 (Permanent Stent) IMPLANT
WIRE ASAHI PROWATER 180CM (WIRE) IMPLANT

## 2024-03-15 NOTE — Procedures (Signed)
 Extubation Procedure Note  Patient Details:   Name: Jeffery Graves DOB: [birth year] MRN: [id number]   Airway Documentation:    Vent end date: 03/15/24 Vent end time: 1700   Evaluation  O2 sats: stable throughout Complications: No apparent complications Patient did tolerate procedure well. Bilateral Breath Sounds: Diminished, Clear   Yes  Pt was extubated per MD order and placed on 4 L [place]. Cuff leak was noted prior to extubation and no stridor post. Pt is stable at this time. RT will monitor.   Youlanda Tomassetti 03/15/2024, 5:07 PM

## 2024-03-15 NOTE — ED Notes (Signed)
 Called and spoke to patient brother Ebb Carelock [phone] will be coming

## 2024-03-15 NOTE — ED Triage Notes (Signed)
 Pt was BIB [facility] from [employer]. He was found having a seizure. Fire was present in [employer]. They went to him and started CPR they attached an AED and shocked 4 times. EMS arrived gave 1mg  of epi and shocked two more times. They then gave 300mg  of amiodorone which then got the patient to sinus tach. They started an EPI drip at 4mcg. Pt had ST elevation in multiple leads. GCS 3.  130 Palpated 96 HR 6-12 RR irregular Capnography 26

## 2024-03-15 NOTE — Progress Notes (Signed)
 Helping ICU RT- transported pt to/from CT scan via vent on 100% fio2 w/ no apparent complications.

## 2024-03-15 NOTE — Plan of Care (Signed)
  Problem: Pain Managment: Goal: General experience of comfort will improve and/or be controlled Outcome: Progressing    Problem: Cardiovascular: Goal: Vascular access site(s) Level 0-1 will be maintained Outcome: Completed/Met

## 2024-03-15 NOTE — ED Notes (Signed)
 Patient was intubated and SpO2 remained at 100%

## 2024-03-15 NOTE — ED Provider Notes (Signed)
 [place] EMERGENCY DEPARTMENT AT [facility] Provider Note   CSN: [id number] Arrival date & time: 03/15/24  1135     Patient presents with: No chief complaint on file.   Jeffery Graves is a 68 y.o. male.   This is a 68 year old male presenting emergency department with EMS as a STEMI status postcardiac arrest.  Reportedly was shopping in a grocery store when found on the ground with seizure-like activity.  Fire department was in same grocery store, and noted patient to be apneic and without pulse.  Started CPR.  The [employer] AED delivered 4 shocks.  EMS reported patient in V. tach.  They gave 1 dose of epi, 300 mg of amiodarone  and delivered 2 shocks with ROSC.  Total downtime reportedly EMS close to 15 minutes.  On arrival appeared to be in sinus tachycardia.  Breathing spontaneously, but with decerebrate posturing.        Prior to Admission medications   Medication Sig Start Date End Date Taking? Authorizing Provider  Brimonidine  Tartrate (LUMIFY  OP) Place 1 drop into both eyes daily.    [provider]  cephALEXin  (KEFLEX ) 500 MG capsule Take 1 capsule (500 mg total) by mouth 2 (two) times daily. X 4 days to prevent infection with catheter 03/07/24   Alvaro Ricardo KATHEE Mickey., MD  senna-docusate (SENOKOT-S) 8.6-50 MG tablet Take 1 tablet by mouth 2 (two) times daily. While taking pain meds to prevent constipation 03/07/24   Alvaro Ricardo KATHEE Mickey., MD  tamsulosin  (FLOMAX ) 0.4 MG CAPS capsule Take 1 capsule (0.4 mg total) by mouth 2 (two) times daily. 06/20/23     traMADol  (ULTRAM ) 50 MG tablet Take 1 tablet (50 mg total) by mouth every 6 (six) hours as needed for moderate pain (pain score 4-6) (post-operatively). 03/07/24 03/07/25  Alvaro Ricardo KATHEE Mickey., MD  valsartan -hydrochlorothiazide  (DIOVAN -HCT) 160-25 MG tablet Take 1 tablet by mouth daily. 02/12/24   Newlin, Enobong, MD  XTANDI 80 MG tablet Take 80 mg by mouth daily. 02/19/24   [provider]    Allergies: Patient  has no known allergies.    Review of Systems  Updated Vital Signs SpO2 100%   Physical Exam Vitals and nursing note reviewed.  Constitutional:      General: He is in acute distress.     Appearance: He is ill-appearing.  HENT:     Mouth/Throat:     Mouth: Mucous membranes are moist.  Eyes:     Conjunctiva/sclera: Conjunctivae normal.  Cardiovascular:     Rate and Rhythm: Tachycardia present.  Pulmonary:     Breath sounds: Normal breath sounds.  Abdominal:     General: There is no distension.  Musculoskeletal:     Right lower leg: No edema.     Left lower leg: No edema.  Skin:    General: Skin is warm.     Capillary Refill: Capillary refill takes less than 2 seconds.  Neurological:     Comments: Not possible to assess  Psychiatric:     Comments: Not possible to assess     (all labs ordered are listed, but only abnormal results are displayed) Labs Reviewed  CBC WITH DIFFERENTIAL/PLATELET - Abnormal; Notable for the following components:      Result Value   WBC 17.0 (*)    Neutro Abs 12.0 (*)    Abs Immature Granulocytes 0.85 (*)    All other components within normal limits  PROTIME-INR - Abnormal; Notable for the following components:   Prothrombin  Time 15.7 (*)    All other components within normal limits  I-STAT CG4 LACTIC ACID, ED - Abnormal; Notable for the following components:   Lactic Acid, Venous 5.1 (*)    All other components within normal limits  CBG MONITORING, ED - Abnormal; Notable for the following components:   Glucose-Capillary 232 (*)    All other components within normal limits  HEMOGLOBIN A1C  APTT  COMPREHENSIVE METABOLIC PANEL WITH GFR  LIPID PANEL  RAPID URINE DRUG SCREEN, HOSP PERFORMED  TROPONIN I (HIGH SENSITIVITY)    EKG: None  Radiology: No results found.   .Critical Care  Performed by: Neysa Caron PARAS, DO Authorized by: Neysa Caron PARAS, DO   Critical care provider statement:    Critical care time (minutes):  30    Critical care was necessary to treat or prevent imminent or life-threatening deterioration of the following conditions:  Cardiac failure, circulatory failure and CNS failure or compromise   Critical care was time spent personally by me on the following activities:  Development of treatment plan with patient or surrogate, discussions with consultants, evaluation of patient's response to treatment, examination of patient, ordering and review of laboratory studies, ordering and review of radiographic studies, ordering and performing treatments and interventions, pulse oximetry, re-evaluation of patient's condition and review of old charts   Care discussed with: admitting provider   Comments:     Discussed with cardiology; taking patient to Cath Lab Procedure Name: Intubation Date/Time: 03/15/2024 12:05 PM  Performed by: Neysa Caron PARAS, DOOxygen Delivery Method: Nasal cannula Preoxygenation: Pre-oxygenation with 100% oxygen Induction Type: Rapid sequence Laryngoscope Size: Glidescope and 4 Grade View: Grade II Tube size: 8.0 mm Number of attempts: 1 Airway Equipment and Method: Stylet and Video-laryngoscopy Placement Confirmation: ETT inserted through vocal cords under direct vision, Positive ETCO2, Breath sounds checked- equal and bilateral and CO2 detector Secured at: 24 cm Tube secured with: ETT holder Difficulty Due To: Difficulty was anticipated Comments: Had oral bleeding prior to intubation secondary to seizure activity       Medications Ordered in the ED  levETIRAcetam  (KEPPRA ) undiluted injection 4,500 mg ( Intravenous MAR Hold 03/15/24 1204)  propofol  (DIPRIVAN ) 1000 MG/100ML infusion (has no administration in time range)  midazolam  (VERSED ) 2 MG/2ML injection (has no administration in time range)  aspirin  suppository 300 mg ( Rectal MAR Hold 03/15/24 1204)  midazolam -sodium chloride  100-0.9 MG/100ML-% infusion (has no administration in time range)  fentaNYL  10 mcg/ml infusion  (has no administration in time range)  heparin  5000 UNIT/ML injection (has no administration in time range)  heparin  injection 4,000 Units ( Intravenous MAR Hold 03/15/24 1204)  nitroGLYCERIN  100 mcg/mL intra-arterial injection (has no administration in time range)                                    Medical Decision Making Is a 68 year old male with past medical history of prostate cancer, hypertension who presented emergency department after cardiac arrest.  Total downtime close 15 minutes.  Received 1 dose of epi and 300 loading dose of amiodarone  with a total of 6 defib/cardioversions outside hospital with ROSC.  On arrival breathing spontaneously, but appeared to be seizing.  Given a dose of Versed  and loaded with Keppra .  Airway was secured with intubation.  EKG concerning for STEMI.  Cardiology at bedside; given heparin  and aspirin  per the recommendations and taking patient to Cath Lab.  He was  started on propofol  for sedation.  Amount and/or Complexity of Data Reviewed Independent Historian: EMS    Details: See above External Data Reviewed:     Details: No significant cardiac history per chart review Labs: ordered.    Details: Glucose of 200.  Unlikely cause of his cardiac arrest.  Lactate of 5 which is not present given his cardiac arrest and seizure.  Leukocytosis of 17, no anemia. Radiology: ordered and independent interpretation performed.    Details: Chest x-ray with appropriately placed ET tube.  Does not appear to have pneumothorax ECG/medicine tests: independent interpretation performed.    Details: Inferior STEMI Discussion of management or test interpretation with external provider(s): Cardiology; take patient to Cath Lab  Risk OTC drugs. Parenteral controlled substances. Decision regarding hospitalization. Diagnosis or treatment significantly limited by social determinants of health.      Final diagnoses:  None    ED Discharge Orders     None           Neysa Caron PARAS, DO 03/15/24 1208

## 2024-03-15 NOTE — Progress Notes (Signed)
 Patient was transported to 2H21 from CATH lab via the ventilator without complication.

## 2024-03-15 NOTE — Progress Notes (Signed)
 [facility] ADULT ICU REPLACEMENT PROTOCOL   The patient does apply for the [facility] Adult ICU Electrolyte Replacment Protocol based on the criteria listed below:   1.Exclusion criteria: TCTS, ECMO, Dialysis, and Myasthenia Gravis patients 2. Is GFR >/= 30 ml/min? Yes.    Patient's GFR today is >60 3. Is SCr </= 2? No. Patient's SCr is 0.88 mg/dL 4. Did SCr increase >/= 0.5 in 24 hours? No. 5.Pt's weight >40kg  Yes.   6. Abnormal electrolyte(s): K 2.9  7. Electrolytes replaced per protocol 8.  Call MD STAT for K+ </= 2.5, Phos </= 1, or Mag </= 1 Physician:  Claudene SHIN, Petrice Beedy A 03/15/2024 10:11 PM

## 2024-03-15 NOTE — H&P (Signed)
 Cardiology Admission History and Physical   Patient ID: Jeffery Graves MRN: [id number]; DOB: 03/29/1956   Admission date: 03/15/2024  PCP:  Delbert Clam, MD   [facility] Providers Cardiologist:  None     Chief Complaint: Cardiac arrest  Patient Profile:   Jeffery Graves is a 68 y.o. male with locally recurrent left sided prostate cance s/p cryoablation 03/06/2024, who is being seen 03/15/2024 for the evaluation of cardiac arrest, STEMI.  History of Present Illness:   Jeffery Graves is a 68 y/o male w/ocally recurrent left sided prostate cance s/p cryoablation 03/06/2024, presented with inferior STEMI and cardiac arrest.  Report was received from EMS.  Patient was at [employer] when he was found to have questionable seizure by bystanders.  Fire department happened to be in [employer] who rushed to the patient and performed CPR within a minute or so.  He was found to have shockable rhythm, received 4 shocks with AED, and another 2 shocks from EMS for VF/VT.  EKG in the field showed inferolateral STEMI.  He also received 1 mg of epinephrine and 300 mg of amiodarone  with return to normal circulation.  Total CPR time was 15-minute per report.  Patient was moaning and groaning, but did not have any purposeful movement.  He had an unsuccessful attempt of intubation in the field.  IV and IO access was placed in the field.    On arrival, patient was hemodynamically stable, was tachycardic and slightly hypertensive, did not have any purposeful movement, had bilateral pupils equal.  He was intubated in the ER.  Repeat EKG still showed inferolateral STEMI.  Lactic acid was 5.0.  Patient was noted to have some decerebrate posturing.  No family available at bedside at the time of arrival.   Past Medical History:  Diagnosis Date   BPH (benign prostatic hyperplasia)    Hx of radiation therapy 09/06/10 - 10/29/10   prostate, seminal vesicles   Hypertension    on meds   Prostate cancer (HCC)  04/26/2010   gleason 7, vol 20.88 cc    Past Surgical History:  Procedure Laterality Date   BIOPSY PROSTATE     CRYOABLATION N/A 03/06/2024   Procedure: CRYOABLATION, PROSTATE, CYSTOSCOPY;  Surgeon: Alvaro Ricardo KATHEE Mickey., MD;  Location: [facility];  Service: Urology;  Laterality: N/A;     Medications Prior to Admission: Prior to Admission medications   Medication Sig Start Date End Date Taking? Authorizing Provider  Brimonidine  Tartrate (LUMIFY  OP) Place 1 drop into both eyes daily.    [provider]  cephALEXin  (KEFLEX ) 500 MG capsule Take 1 capsule (500 mg total) by mouth 2 (two) times daily. X 4 days to prevent infection with catheter 03/07/24   Alvaro Ricardo KATHEE Mickey., MD  senna-docusate (SENOKOT-S) 8.6-50 MG tablet Take 1 tablet by mouth 2 (two) times daily. While taking pain meds to prevent constipation 03/07/24   Alvaro Ricardo KATHEE Mickey., MD  tamsulosin  (FLOMAX ) 0.4 MG CAPS capsule Take 1 capsule (0.4 mg total) by mouth 2 (two) times daily. 06/20/23     traMADol  (ULTRAM ) 50 MG tablet Take 1 tablet (50 mg total) by mouth every 6 (six) hours as needed for moderate pain (pain score 4-6) (post-operatively). 03/07/24 03/07/25  Alvaro Ricardo KATHEE Mickey., MD  valsartan -hydrochlorothiazide  (DIOVAN -HCT) 160-25 MG tablet Take 1 tablet by mouth daily. 02/12/24   Newlin, Enobong, MD  XTANDI 80 MG tablet Take 80 mg by mouth daily. 02/19/24   [provider]  Allergies:   No Known Allergies  Social History:   Social History   Socioeconomic History   Marital status: Single    Spouse name: Not on file   Number of children: Not on file   Years of education: Not on file   Highest education level: Not on file  Occupational History   Not on file  Tobacco Use   Smoking status: Never   Smokeless tobacco: Never  Vaping Use   Vaping status: Never Used  Substance and Sexual Activity   Alcohol use: Never    Alcohol/week: 7.0 standard drinks of alcohol    Types: 7 Standard drinks or equivalent  per week   Drug use: No   Sexual activity: Not on file  Other Topics Concern   Not on file  Social History Narrative   Not on file   Social Drivers of Health   Financial Resource Strain: Not on file  Food Insecurity: No Food Insecurity (03/07/2024)   Hunger Vital Sign    Worried About Running Out of Food in the Last Year: Never true    Ran Out of Food in the Last Year: Never true  Transportation Needs: No Transportation Needs (03/07/2024)   PRAPARE - Administrator, Civil Service (Medical): No    Lack of Transportation (Non-Medical): No  Physical Activity: Not on file  Stress: Not on file  Social Connections: Patient Declined (03/07/2024)   Social Connection and Isolation Panel    Frequency of Communication with Friends and Family: Patient declined    Frequency of Social Gatherings with Friends and Family: Patient declined    Attends Religious Services: Patient declined    Database administrator or Organizations: Patient declined    Attends Banker Meetings: Patient declined    Marital Status: Patient declined  Intimate Partner Violence: Not At Risk (03/07/2024)   Humiliation, Afraid, Rape, and Kick questionnaire    Fear of Current or Ex-Partner: No    Emotionally Abused: No    Physically Abused: No    Sexually Abused: No    Family History:   The patient's family history includes Stroke in his mother. There is no history of Colon cancer, Colon polyps, Esophageal cancer, Stomach cancer, or Rectal cancer.    ROS:  Please see the history of present illness.  Unable to obtain ROS due to neurological status and intubated patient.  Physical Exam/Data:   Vitals:   03/15/24 1258 03/15/24 1303 03/15/24 1315 03/15/24 1330  BP:   122/76 135/81  Pulse: (!) 0 (!) 0 77 79  Resp:   19 (!) 22  Temp:    (!) 95.7 F (35.4 C)  SpO2:   100% 100%    Intake/Output Summary (Last 24 hours) at 03/15/2024 1407 Last data filed at 03/15/2024 1300 Gross per 24 hour  Intake  50 ml  Output 1250 ml  Net -1200 ml      03/06/2024    1:11 PM 02/27/2024    9:37 AM 02/12/2024    8:41 AM  Last 3 Weights  Weight (lbs) 170 lb 170 lb 172 lb 9.6 oz  Weight (kg) 77.111 kg 77.111 kg 78.291 kg     There is no height or weight on file to calculate BMI.   Physical Exam Vitals and nursing note reviewed.  Constitutional:      General: He is in acute distress.  HENT:     Mouth/Throat:     Comments: Minor bleeding in oropharynx, recent attempted  but unsuccessful intubation Neck:     Vascular: No JVD.  Cardiovascular:     Rate and Rhythm: Regular rhythm. Tachycardia present.     Heart sounds: Normal heart sounds. No murmur heard. Pulmonary:     Effort: Pulmonary effort is normal.     Breath sounds: Normal breath sounds. No wheezing or rales.  Neurological:     Comments: Decerebrate posturing      EKG:  The ECG that was done as below was personally reviewed and demonstrates the following EKG 8/15/202025 11: 42 Sinus tachycardia Acute inferolateral injury  Relevant CV Studies: No prior studies  Laboratory Data:  High Sensitivity Troponin:   Recent Labs  Lab 03/15/24 1139  TROPONINIHS 1,520*      Chemistry Recent Labs  Lab 03/15/24 1139 03/15/24 1221  NA 135 136  K 3.9 2.4*  CL 100  --   CO2 21*  --   GLUCOSE 212*  --   BUN 15  --   CREATININE 1.04  --   CALCIUM  8.6*  --   GFRNONAA >60  --   ANIONGAP 14  --     Recent Labs  Lab 03/15/24 1139  PROT 7.0  ALBUMIN 3.5  AST 132*  ALT 111*  ALKPHOS 86  BILITOT 1.1   Lipids  Recent Labs  Lab 03/15/24 1139  CHOL 164  TRIG 39  HDL 52  LDLCALC 104*  CHOLHDL 3.2   Hematology Recent Labs  Lab 03/15/24 1139 03/15/24 1221  WBC 17.0*  --   RBC 5.00  --   HGB 13.4 13.3  HCT 41.9 39.0  MCV 83.8  --   MCH 26.8  --   MCHC 32.0  --   RDW 13.7  --   PLT 213  --    Thyroid No results for input(s): TSH, FREET4 in the last 168 hours. BNPNo results for input(s): BNP, PROBNP in  the last 168 hours.  DDimer No results for input(s): DDIMER in the last 168 hours.   Radiology/Studies:  DG Chest Portable 1 View Result Date: 03/15/2024 CLINICAL DATA:  Acute myocardial infarction. EXAM: PORTABLE CHEST 1 VIEW COMPARISON:  05/28/2008 FINDINGS: No significant change in enlargement of the cardiac silhouette. Clear lungs with normal vascularity. Endotracheal tube with its tip 1.9 cm above the carina. It is recommended that this be retracted 4 cm. Nasogastric tube tip in the proximal stomach and side hole in the region of the gastroesophageal junction. It is recommended that this be advanced 6 cm. Mild scoliosis. IMPRESSION: 1. Endotracheal tube tip 1.9 cm above the carina. It is recommended that this be retracted 4 cm. 2. Nasogastric tube tip in the proximal stomach and side hole in the region of the gastroesophageal junction. It is recommended that this be advanced 6 cm. 3. Stable cardiomegaly. Electronically Signed   By: Elspeth Bathe M.D.   On: 03/15/2024 12:19     Assessment and Plan:   68 y.o. male with locally recurrent left sided prostate cance s/p cryoablation 03/06/2024, who is being seen 03/15/2024 for the evaluation of cardiac arrest, STEMI.  Out-of-hospital cardiac arrest: VF/VT arrest due to inferolateral STEMI. Neurological status concerning with decerebrate posturing and GCS of around 3.  However, given presentation with clear STEMI, lactic acid of only 5.0, appropriate CPR and ACLS for 15-minute and general hemodynamic stability, I think it is reasonable to consider emergency coronary angiography and intervention.  Neurological prognosis remains uncertain and unpredictable this early after his cardiac arrest.  In the  ER, patient was intubated by ER physicians.  Will try to contact his family and take him urgently to Cath Lab. Given no clear external head trauma, only questionable seizures in the setting of VF/VT, discussed with emergency room physicians and we decided  to forego head CT before emergent coronary angiography and intervention.  He will certainly need a head CT scan at some point.   Risk Assessment/Risk Scores:    TIMI Risk Score for ST  Elevation MI:   The patient's TIMI risk score is 6, which indicates a 16.1% risk of all cause mortality at 30 days.       Code Status: Full Code (no family available, patient unable to express CODE STATUS, therefore presume full code at this time).  Severity of Illness: The appropriate patient status for this patient is INPATIENT. Inpatient status is judged to be reasonable and necessary in order to provide the required intensity of service to ensure the patient's safety. The patient's presenting symptoms, physical exam findings, and initial radiographic and laboratory data in the context of their chronic comorbidities is felt to place them at high risk for further clinical deterioration. Furthermore, it is not anticipated that the patient will be medically stable for discharge from the hospital within 2 midnights of admission.   * I certify that at the point of admission it is my clinical judgment that the patient will require inpatient hospital care spanning beyond 2 midnights from the point of admission due to high intensity of service, high risk for further deterioration and high frequency of surveillance required.*   For questions or updates, please contact [facility] Please consult [email or web address] for contact info under     CRITICAL CARE Performed by: Jeffery Graves   Total critical care time: 40 minutes   Critical care time was exclusive of separately billable procedures and treating other patients.   Critical care was necessary to treat or prevent imminent or life-threatening deterioration.   Critical care was time spent personally by me on the following activities: development of treatment plan with patient and/or surrogate as well as nursing, discussions with consultants, evaluation  of patient's response to treatment, examination of patient, obtaining history from patient or surrogate, ordering and performing treatments and interventions, ordering and review of laboratory studies, ordering and review of radiographic studies, pulse oximetry and re-evaluation of patient's condition.     Signed, Jeffery JINNY Lawrence, MD 03/15/2024 2:07 PM

## 2024-03-15 NOTE — Progress Notes (Signed)
 Pt transported from ED [facility] to Cathlab without complication.

## 2024-03-15 NOTE — Consult Note (Addendum)
 NAME:  Jeffery Graves, MRN:  [id number], DOB:  1955-11-16, LOS: 0 ADMISSION DATE:  03/15/2024, CONSULTATION DATE:  03/15/24 REFERRING MD:  [place], CHIEF COMPLAINT:  Cardiac Arrest    History of Present Illness:  Jeffery Graves is a 68 y.o. M with PMH significant for HTN and recurrent prostate Ca with obstructive voiding, last seen in urology clinic 03/06/24 and plan to proceed with salvage cryo for locally recurrent prostate cancer who was in a grocery store when he had a collapse with possible seizure like activity.  EMS happened to be in the store and started CPR in about one minute, found to be in a shockable rhythm and received about 4-6 defibrillations from the AED and approximately 15 mins CPR.  His initial EKG showed inferolateral STEMI.  Pt had ROSC at the time of ED arrival and was groaning but without purposeful movement.   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   Significant Hospital Events: Including procedures, antibiotic start and stop dates in addition to other pertinent events   8/15 STEMI, 15 minute Vfib arrest   Interim History / Subjective:  Weaned sedation with non-purposeful movement   Objective    Blood pressure (!) 152/95, pulse (!) 0, resp. rate (!) 22, SpO2 100%.    Vent Mode: PRVC FiO2 (%):  [60 %-100 %] 60 % Set Rate:  [22 bmp] 22 bmp Vt Set:  [510 mL] 510 mL PEEP:  [5 cmH20] 5 cmH20 Plateau Pressure:  [15 cmH20] 15 cmH20   Intake/Output Summary (Last 24 hours) at 03/15/2024 1328 Last data filed at 03/15/2024 1300 Gross per 24 hour  Intake 50 ml  Output 1250 ml  Net -1200 ml   There were no vitals filed for this visit.  General:  well nourished M intubated and waking up  in NAD  HEENT: MM pink/moist, sclera anicteric, ETT in place Neuro: moving all extremities off sedation, no purposeful movement, no seizure activity CV: s1s2 rrr, no m/r/g PULM:  clear bilaterally on PRVC  GI: soft, non-tender  Extremities: warm/dry, no edema  Skin: no rashes or lesions   Resolved problem list   Assessment and Plan    Witnessed out of hospital Vfib cardiac arrest  Anterolateral STEMI Acute Hypoxic Respiratory failure  Rapid bystander CPR by EMS, defibrillated approximately 6 times and approximately 15 mins CPR, epi and amio  -Taken emergently to the cath lab with culprit lesion in the R PLA  s/p PCI  -weaning sedation for neuro exam -loaded with amio, continue infusion at 60mg /hr -repeat electrolytes are pending  -normothermia -high intensity statin and Asa 81mg  -echo -EEG if mental status not improving -appreciate cardiology and HF team consults  --Maintain full vent support with SAT/SBT as tolerated -titrate Vent setting to maintain SpO2 greater than or equal to 90%. -HOB elevated 30 degrees. -Plateau pressures less than 30 cm H20.  -Follow chest x-ray, ABG prn.   -Bronchial hygiene and RT/bronchodilator protocol.  Elevated LFT's Trend, likely secondary to shock liver   Hx of prostate Ca Follows with Dr. Alvaro, recent cryo -follow up outpatient   Hyperglycemia -SSI and A1c  Best Practice (right click and Reselect all SmartList Selections daily)   Diet/type: NPO DVT  prophylaxis LMWH Pressure ulcer(s): N/A GI prophylaxis: PPI Lines: N/A Foley:  Yes, and it is still needed Code Status:  full code Last date of multidisciplinary goals of care discussion [pending]  Labs   CBC: Recent Labs  Lab 03/15/24 1139 03/15/24 1221  WBC 17.0*  --   NEUTROABS 12.0*  --   HGB 13.4 13.3  HCT 41.9 39.0  MCV 83.8  --   PLT 213  --     Basic Metabolic Panel: Recent Labs  Lab 03/15/24 1139 03/15/24 1221  NA 135 136  K 3.9 2.4*  CL 100  --    CO2 21*  --   GLUCOSE 212*  --   BUN 15  --   CREATININE 1.04  --   CALCIUM  8.6*  --    GFR: Estimated Creatinine Clearance: 68.3 mL/min (by C-G formula based on SCr of 1.04 mg/dL). Recent Labs  Lab 03/15/24 1139 03/15/24 1147 03/15/24 1232  WBC 17.0*  --   --   LATICACIDVEN  --  5.1* 1.7    Liver Function Tests: Recent Labs  Lab 03/15/24 1139  AST 132*  ALT 111*  ALKPHOS 86  BILITOT 1.1  PROT 7.0  ALBUMIN 3.5   No results for input(s): LIPASE, AMYLASE in the last 168 hours. No results for input(s): AMMONIA in the last 168 hours.  ABG    Component Value Date/Time   PHART 7.371 03/15/2024 1221   PCO2ART 37.9 03/15/2024 1221   PO2ART 450 (H) 03/15/2024 1221   HCO3 21.9 03/15/2024 1221   TCO2 23 03/15/2024 1221   ACIDBASEDEF 3.0 (H) 03/15/2024 1221   O2SAT 100 03/15/2024 1221     Coagulation Profile: Recent Labs  Lab 03/15/24 1139  INR 1.2    Cardiac Enzymes: No results for input(s): CKTOTAL, CKMB, CKMBINDEX, TROPONINI in the last 168 hours.  HbA1C: Hgb A1c MFr Bld  Date/Time Value Ref Range Status  03/15/2024 11:39 AM 5.2 4.8 - 5.6 % Final    Comment:    (NOTE) Diagnosis of Diabetes The following HbA1c ranges recommended by the American Diabetes Association (ADA) may be used as an aid in the diagnosis of diabetes mellitus.  Hemoglobin             Suggested A1C NGSP%              Diagnosis  <5.7                   Non Diabetic  5.7-6.4                Pre-Diabetic  >6.4                   Diabetic  <7.0                   Glycemic control for                       adults with diabetes.    02/12/2024 09:31 AM 5.4 4.8 - 5.6 % Final    Comment:             Prediabetes: 5.7 - 6.4          Diabetes: >6.4          Glycemic control for adults with diabetes: <7.0     CBG: Recent Labs  Lab 03/15/24 1140  GLUCAP 232*    Review of Systems:   Unable to obtain  Past Medical  History:  He,  has a past medical history of BPH  (benign prostatic hyperplasia), radiation therapy (09/06/10 - 10/29/10), Hypertension, and Prostate cancer (HCC) (04/26/2010).   Surgical History:   Past Surgical History:  Procedure Laterality Date   BIOPSY PROSTATE     CRYOABLATION N/A 03/06/2024   Procedure: CRYOABLATION, PROSTATE, CYSTOSCOPY;  Surgeon: Alvaro Ricardo KATHEE Mickey., MD;  Location: [facility];  Service: Urology;  Laterality: N/A;     Social History:   reports that he has never smoked. He has never used smokeless tobacco. He reports that he does not drink alcohol and does not use drugs.   Family History:  His family history includes Stroke in his mother. There is no history of Colon cancer, Colon polyps, Esophageal cancer, Stomach cancer, or Rectal cancer.   Allergies No Known Allergies   Home Medications  Prior to Admission medications   Medication Sig Start Date End Date Taking? Authorizing Provider  Brimonidine  Tartrate (LUMIFY  OP) Place 1 drop into both eyes daily.    [provider]  cephALEXin  (KEFLEX ) 500 MG capsule Take 1 capsule (500 mg total) by mouth 2 (two) times daily. X 4 days to prevent infection with catheter 03/07/24   Alvaro Ricardo KATHEE Mickey., MD  senna-docusate (SENOKOT-S) 8.6-50 MG tablet Take 1 tablet by mouth 2 (two) times daily. While taking pain meds to prevent constipation 03/07/24   Alvaro Ricardo KATHEE Mickey., MD  tamsulosin  (FLOMAX ) 0.4 MG CAPS capsule Take 1 capsule (0.4 mg total) by mouth 2 (two) times daily. 06/20/23     traMADol  (ULTRAM ) 50 MG tablet Take 1 tablet (50 mg total) by mouth every 6 (six) hours as needed for moderate pain (pain score 4-6) (post-operatively). 03/07/24 03/07/25  Alvaro Ricardo KATHEE Mickey., MD  valsartan -hydrochlorothiazide  (DIOVAN -HCT) 160-25 MG tablet Take 1 tablet by mouth daily. 02/12/24   Newlin, Enobong, MD  XTANDI 80 MG tablet Take 80 mg by mouth daily. 02/19/24   [provider]     Critical care time:  60 minutes       CRITICAL CARE Performed by: Leita SAUNDERS  Harly Pipkins   Total critical care time: 60 minutes  Critical care time was exclusive of separately billable procedures and treating other patients.  Critical care was necessary to treat or prevent imminent or life-threatening deterioration.  Critical care was time spent personally by me on the following activities: development of treatment plan with patient and/or surrogate as well as nursing, discussions with consultants, evaluation of patient's response to treatment, examination of patient, obtaining history from patient or surrogate, ordering and performing treatments and interventions, ordering and review of laboratory studies, ordering and review of radiographic studies, pulse oximetry and re-evaluation of patient's condition.   Leita SAUNDERS Mable Lashley, PA-C [facility] See Amion for pager If no response to pager , please call [phone] until 7pm After 7:00 pm call Elink  [phone]

## 2024-03-15 NOTE — Consult Note (Addendum)
 Advanced Heart Failure Team Consult Note   Primary Physician: Delbert Clam, MD Cardiologist:  None  Reason for Consultation: Post OOH Cardiac Arrest, Acute Anterolateral STEMI   HPI:    Jeffery Graves is seen today for evaluation post OOH cardiac arrest at the request of Dr. Elmira, Cardiology.   68 y/o AA male w/ h/o prostate cancer s/p recent prostate cryoablation on 03/06/24 and HTN but no other significant PMH.   Was shopping at [employer] today when he had witnessed syncope and collapse. Bystanders reported seizure-like activity. Fire squad was on site and started CPR within 1 min of collapse. Had shockable rhythm (VF). Had total of 6 defibrillations, epi + amio. Total of 15 min CPR. Post arrest EKG w/ inferolateral STE. On arrival to ED had noted posturing. Arrested again in the ED. CPR restarted w/ ROSC. Initial LA 5.4. Sent to cath lab.   Cath showed distal occlusion of right PLB, large vessel supplying lateral wall. No other significant disease. S/p PCI   Initial K 3.9, Mg pending.   LA cleared on repeat, 5.1>>1.7. No current pressor requirement.   UDS negative   Echo pending   Home Medications Prior to Admission medications   Medication Sig Start Date End Date Taking? Authorizing Provider  Brimonidine  Tartrate (LUMIFY  OP) Place 1 drop into both eyes daily.    [provider]  cephALEXin  (KEFLEX ) 500 MG capsule Take 1 capsule (500 mg total) by mouth 2 (two) times daily. X 4 days to prevent infection with catheter 03/07/24   Alvaro Ricardo KATHEE Mickey., MD  senna-docusate (SENOKOT-S) 8.6-50 MG tablet Take 1 tablet by mouth 2 (two) times daily. While taking pain meds to prevent constipation 03/07/24   Alvaro Ricardo KATHEE Mickey., MD  tamsulosin  (FLOMAX ) 0.4 MG CAPS capsule Take 1 capsule (0.4 mg total) by mouth 2 (two) times daily. 06/20/23     traMADol  (ULTRAM ) 50 MG tablet Take 1 tablet (50 mg total) by mouth every 6 (six) hours as needed for moderate pain (pain score 4-6)  (post-operatively). 03/07/24 03/07/25  Alvaro Ricardo KATHEE Mickey., MD  valsartan -hydrochlorothiazide  (DIOVAN -HCT) 160-25 MG tablet Take 1 tablet by mouth daily. 02/12/24   Newlin, Enobong, MD  XTANDI 80 MG tablet Take 80 mg by mouth daily. 02/19/24   [provider]    Past Medical History: Past Medical History:  Diagnosis Date   BPH (benign prostatic hyperplasia)    Hx of radiation therapy 09/06/10 - 10/29/10   prostate, seminal vesicles   Hypertension    on meds   Prostate cancer (HCC) 04/26/2010   gleason 7, vol 20.88 cc    Past Surgical History: Past Surgical History:  Procedure Laterality Date   BIOPSY PROSTATE     CRYOABLATION N/A 03/06/2024   Procedure: CRYOABLATION, PROSTATE, CYSTOSCOPY;  Surgeon: Alvaro Ricardo KATHEE Mickey., MD;  Location: [facility];  Service: Urology;  Laterality: N/A;    Family History: Family History  Problem Relation Age of Onset   Stroke Mother    Colon cancer Neg Hx    Colon polyps Neg Hx    Esophageal cancer Neg Hx    Stomach cancer Neg Hx    Rectal cancer Neg Hx     Social History: Social History   Socioeconomic History   Marital status: Single    Spouse name: Not on file   Number of children: Not on file   Years of education: Not on file   Highest education level: Not on file  Occupational  History   Not on file  Tobacco Use   Smoking status: Never   Smokeless tobacco: Never  Vaping Use   Vaping status: Never Used  Substance and Sexual Activity   Alcohol use: Never    Alcohol/week: 7.0 standard drinks of alcohol    Types: 7 Standard drinks or equivalent per week   Drug use: No   Sexual activity: Not on file  Other Topics Concern   Not on file  Social History Narrative   Not on file   Social Drivers of Health   Financial Resource Strain: Not on file  Food Insecurity: No Food Insecurity (03/07/2024)   Hunger Vital Sign    Worried About Running Out of Food in the Last Year: Never true    Ran Out of Food in the Last Year: Never true   Transportation Needs: No Transportation Needs (03/07/2024)   PRAPARE - Administrator, Civil Service (Medical): No    Lack of Transportation (Non-Medical): No  Physical Activity: Not on file  Stress: Not on file  Social Connections: Patient Declined (03/07/2024)   Social Connection and Isolation Panel    Frequency of Communication with Friends and Family: Patient declined    Frequency of Social Gatherings with Friends and Family: Patient declined    Attends Religious Services: Patient declined    Database administrator or Organizations: Patient declined    Attends Banker Meetings: Patient declined    Marital Status: Patient declined    Allergies:  No Known Allergies  Objective:    Vital Signs:   SpO2:  [100 %] 100 % (08/15 1146) FiO2 (%):  [100 %] 100 % (08/15 1146)    Weight change: There were no vitals filed for this visit.  Intake/Output:  No intake or output data in the 24 hours ending 03/15/24 1210    Physical Exam    General:  intubated, no purposeful movement   HEENT: +ETT  Neck: JVD 12 cm  Cor: RRR Lungs: intubated, clear  Abdomen: soft, nondistended. . Extremities: no cyanosis, clubbing, rash, edema Neuro: intubated, no purposeful movement post arrest  GU: + foley   Telemetry   Sinus tach 110s, personally reviewed   EKG    Sinus tach 106 bpm w/ anterolateral STE and PVCs, personally reviewed   Labs   Basic Metabolic Panel: No results for input(s): NA, K, CL, CO2, GLUCOSE, BUN, CREATININE, CALCIUM , MG, PHOS in the last 168 hours.  Liver Function Tests: No results for input(s): AST, ALT, ALKPHOS, BILITOT, PROT, ALBUMIN in the last 168 hours. No results for input(s): LIPASE, AMYLASE in the last 168 hours. No results for input(s): AMMONIA in the last 168 hours.  CBC: Recent Labs  Lab 03/15/24 1139  WBC 17.0*  NEUTROABS 12.0*  HGB 13.4  HCT 41.9  MCV 83.8  PLT 213    Cardiac  Enzymes: No results for input(s): CKTOTAL, CKMB, CKMBINDEX, TROPONINI in the last 168 hours.  BNP: BNP (last 3 results) No results for input(s): BNP in the last 8760 hours.  ProBNP (last 3 results) No results for input(s): PROBNP in the last 8760 hours.   CBG: Recent Labs  Lab 03/15/24 1140  GLUCAP 232*    Coagulation Studies: Recent Labs    03/15/24 1139  LABPROT 15.7*  INR 1.2     Imaging   No results found.   Medications:     Current Medications:  [MAR Hold] aspirin   300 mg Rectal Once   heparin        [  MAR Hold] heparin   4,000 Units Intravenous Once   [MAR Hold] levETIRAcetam   4,500 mg Intravenous Once   midazolam        nitroGLYCERIN         Infusions:  fentaNYL      midazolam -sodium chloride      propofol         Patient Profile   68 y/o male w/ h/o prostate cancer s/p recent prostate cryoablation on 03/06/24. No other significant PMH. Admitted post OOH Cardiac Arrest.   Assessment/Plan   1. OOH VF Cardiac Arrest  - Witnessed, CPR initiated ~1 min after found down, total ~15 min CPR, 6 defibs, epi and amio  - posturing on arrival  - post arrest EKG w/ anterolateral STE. Emergent cath w/ right LPB occlusion (culprit). S/p PCI  - LA cleared, 5.1>>1.7  - obtain echo  - post arrest management per CCM  - needs EEG  - will need neuro assessment once off sedation   2. CAD with inferolateral STEMI/CAD - Culprit on cath was occlusion of large RPLB, s/p PCI. No other disease   - DAPT w/ ASA + Brilinta  - start high intensity statin and check LP   3. Hypoxic Respiratory Failure - intubated  - vent management per CCM   4. Elevated LFTs - initial AST 132, ALT 11 - suspect shock liver post arrest - follow CMP   5. Prostate Cancer  - s/p recent prostate cryoablation on 03/06/24  CRITICAL CARE Performed by: Caffie Shed   Total critical care time: 15 minutes  Critical care time was exclusive of separately billable procedures and  treating other patients.  Critical care was necessary to treat or prevent imminent or life-threatening deterioration.  Critical care was time spent personally by me on the following activities: development of treatment plan with patient and/or surrogate as well as nursing, discussions with consultants, evaluation of patient's response to treatment, examination of patient, obtaining history from patient or surrogate, ordering and performing treatments and interventions, ordering and review of laboratory studies, ordering and review of radiographic studies, pulse oximetry and re-evaluation of patient's condition.   Length of Stay: 0  Caffie Shed, PA-C  03/15/2024, 12:10 PM    Advanced Heart Failure Team Pager [phone] (M-F; 7a - 5p)  Please contact [facility] for night-coverage after hours (4p -7a ) and weekends on [email or web address]   Agree with above.   68 y/o male with no known h/o CV disease. Recent prostate cryoablation.   Had witnessed arrest in grocery store today with rapid bystander CPR. Had 6 shocks and 15 mins of CPR. Intubated and brought to cath lab. Was moving and groaning on arrival. ? Of some posturing.   Initial lactate 5.1.   Seen by Dr. Elmira and brought to cath lab immediately. Cath with occluded RPL treated with PCI/DES  Lactate cleared. No pressor needs  General:  intubated sedated HEENT: + ETT severe cataracts Neck: supple. no JVD. Carotids 2+ bilat; no bruits. Cor:. Regular rate & rhythm. No rubs, gallops or murmurs. Lungs: clear Abdomen: soft, nontender, nondistended. No hepatosplenomegaly. No bruits or masses. Good bowel sounds. Extremities: no cyanosis, clubbing, rash, edema Neuro: intubated/sedated  He has suffered a VT/VF OOH arrest in setting of inferolateral STEMI. Now s/p PCI.   Lactate has cleared. Will continue Dapt. Add GDMT as tolerated. Check echo  CCM on board to manage vent.   Will need close surveillance of Neuro status.    CRITICAL CARE Performed by: Cherrie Sieving  Total critical care time: 65 minutes  Critical care time was exclusive of separately billable procedures and treating other patients.  Critical care was necessary to treat or prevent imminent or life-threatening deterioration.  Critical care was time spent personally by me (independent of midlevel providers or residents) on the following activities: development of treatment plan with patient and/or surrogate as well as nursing, discussions with consultants, evaluation of patient's response to treatment, examination of patient, obtaining history from patient or surrogate, ordering and performing treatments and interventions, ordering and review of laboratory studies, ordering and review of radiographic studies, pulse oximetry and re-evaluation of patient's condition.  Toribio Fuel, MD  4:29 PM

## 2024-03-15 NOTE — Telephone Encounter (Signed)
 Patient Product/process development scientist completed.    The patient is insured through [facility]. Patient has Medicare and is not eligible for a copay card, but may be able to apply for patient assistance or Medicare RX Payment Plan (Patient Must reach out to their plan, if eligible for payment plan), if available.    Ran test claim for Brilinta  90 mg and the current 30 day co-pay is $0.00.  Ran test claim for ticagrelor  (Brilinta ) 90 mg and Product not on Formulary  This test claim was processed through [employer]- copay amounts may vary at other pharmacies due to [employer], or as the patient moves through the different stages of their insurance plan.     Reyes Sharps, CPHT Pharmacy Technician III Certified Patient Advocate [facility] Patient Advocate Team Direct Number: [phone]  Fax: [phone]

## 2024-03-16 ENCOUNTER — Other Ambulatory Visit (HOSPITAL_COMMUNITY): Payer: Self-pay

## 2024-03-16 ENCOUNTER — Encounter (HOSPITAL_COMMUNITY): Payer: Self-pay | Admitting: Cardiology

## 2024-03-16 ENCOUNTER — Inpatient Hospital Stay (HOSPITAL_COMMUNITY)

## 2024-03-16 DIAGNOSIS — G9340 Encephalopathy, unspecified: Secondary | ICD-10-CM | POA: Diagnosis not present

## 2024-03-16 DIAGNOSIS — I469 Cardiac arrest, cause unspecified: Secondary | ICD-10-CM

## 2024-03-16 LAB — COMPREHENSIVE METABOLIC PANEL WITH GFR
ALT: 91 U/L — ABNORMAL HIGH (ref 0–44)
AST: 158 U/L — ABNORMAL HIGH (ref 15–41)
Albumin: 3.1 g/dL — ABNORMAL LOW (ref 3.5–5.0)
Alkaline Phosphatase: 70 U/L (ref 38–126)
Anion gap: 9 (ref 5–15)
BUN: 10 mg/dL (ref 8–23)
CO2: 24 mmol/L (ref 22–32)
Calcium: 8.4 mg/dL — ABNORMAL LOW (ref 8.9–10.3)
Chloride: 99 mmol/L (ref 98–111)
Creatinine, Ser: 0.79 mg/dL (ref 0.61–1.24)
GFR, Estimated: >60 mL/min (ref >60)
Glucose, Bld: 121 mg/dL — ABNORMAL HIGH (ref 70–99)
Potassium: 3.5 mmol/L (ref 3.5–5.1)
Sodium: 132 mmol/L — ABNORMAL LOW (ref 135–145)
Total Bilirubin: 0.8 mg/dL (ref 0.0–1.2)
Total Protein: 6.3 g/dL — ABNORMAL LOW (ref 6.5–8.1)

## 2024-03-16 LAB — ECHOCARDIOGRAM COMPLETE
Area-P 1/2: 3.36 cm2
Calc EF: 31.7 %
Est EF: 40
S' Lateral: 2.6 cm
Single Plane A2C EF: 45.2 %
Single Plane A4C EF: 15.3 %

## 2024-03-16 LAB — CBC
HCT: 39.9 % (ref 39.0–52.0)
Hemoglobin: 12.7 g/dL — ABNORMAL LOW (ref 13.0–17.0)
MCH: 26.9 pg (ref 26.0–34.0)
MCHC: 31.8 g/dL (ref 30.0–36.0)
MCV: 84.5 fL (ref 80.0–100.0)
Platelets: 191 K/uL (ref 150–400)
RBC: 4.72 MIL/uL (ref 4.22–5.81)
RDW: 13.9 % (ref 11.5–15.5)
WBC: 10.2 K/uL (ref 4.0–10.5)
nRBC: 0 % (ref 0.0–0.2)

## 2024-03-16 LAB — BASIC METABOLIC PANEL WITH GFR
Anion gap: 10 (ref 5–15)
BUN: 9 mg/dL (ref 8–23)
CO2: 24 mmol/L (ref 22–32)
Calcium: 8.7 mg/dL — ABNORMAL LOW (ref 8.9–10.3)
Chloride: 102 mmol/L (ref 98–111)
Creatinine, Ser: 0.8 mg/dL (ref 0.61–1.24)
GFR, Estimated: >60 mL/min (ref >60)
Glucose, Bld: 114 mg/dL — ABNORMAL HIGH (ref 70–99)
Potassium: 3.6 mmol/L (ref 3.5–5.1)
Sodium: 136 mmol/L (ref 135–145)

## 2024-03-16 LAB — GLUCOSE, CAPILLARY
Glucose-Capillary: 140 mg/dL — ABNORMAL HIGH (ref 70–99)
Glucose-Capillary: 105 mg/dL — ABNORMAL HIGH (ref 70–99)
Glucose-Capillary: 113 mg/dL — ABNORMAL HIGH (ref 70–99)
Glucose-Capillary: 93 mg/dL (ref 70–99)
Glucose-Capillary: 98 mg/dL (ref 70–99)
Glucose-Capillary: 102 mg/dL — ABNORMAL HIGH (ref 70–99)

## 2024-03-16 LAB — HEMOGLOBIN A1C
Hgb A1c MFr Bld: 5.2 % (ref 4.8–5.6)
Mean Plasma Glucose: 102.54 mg/dL

## 2024-03-16 LAB — MAGNESIUM: Magnesium: 2 mg/dL (ref 1.7–2.4)

## 2024-03-16 MED ORDER — LOSARTAN POTASSIUM 25 MG PO TABS
25.0000 mg | ORAL_TABLET | Freq: Every day | ORAL | Status: DC
  Administered 2024-03-16 – 2024-03-18 (×3): 25 mg via ORAL
  Filled 2024-03-16 (×3): qty 1

## 2024-03-16 MED ORDER — CLOPIDOGREL BISULFATE 300 MG PO TABS
600.0000 mg | ORAL_TABLET | Freq: Once | ORAL | Status: AC
  Administered 2024-03-16: 600 mg via ORAL
  Filled 2024-03-16: qty 2

## 2024-03-16 MED ORDER — CLOPIDOGREL BISULFATE 75 MG PO TABS
75.0000 mg | ORAL_TABLET | Freq: Every day | ORAL | Status: DC
  Administered 2024-03-17 – 2024-03-19 (×3): 75 mg via ORAL
  Filled 2024-03-16 (×3): qty 1

## 2024-03-16 MED ORDER — METOPROLOL SUCCINATE ER 25 MG PO TB24
25.0000 mg | ORAL_TABLET | Freq: Every day | ORAL | Status: DC
  Administered 2024-03-16 – 2024-03-19 (×4): 25 mg via ORAL
  Filled 2024-03-16 (×4): qty 1

## 2024-03-16 MED ORDER — POTASSIUM CHLORIDE CRYS ER 20 MEQ PO TBCR
40.0000 meq | EXTENDED_RELEASE_TABLET | Freq: Once | ORAL | Status: AC
  Administered 2024-03-16: 40 meq via ORAL
  Filled 2024-03-16: qty 2

## 2024-03-16 MED ORDER — LIDOCAINE 5 % EX PTCH
2.0000 | MEDICATED_PATCH | CUTANEOUS | Status: DC
  Administered 2024-03-16 – 2024-03-18 (×3): 2 via TRANSDERMAL
  Filled 2024-03-16 (×4): qty 2

## 2024-03-16 MED ORDER — SPIRONOLACTONE 12.5 MG HALF TABLET
12.5000 mg | ORAL_TABLET | Freq: Every day | ORAL | Status: DC
  Administered 2024-03-16: 12.5 mg via ORAL
  Filled 2024-03-16: qty 1

## 2024-03-16 MED ORDER — ORAL CARE MOUTH RINSE
15.0000 mL | OROMUCOSAL | Status: DC | PRN
Start: 2024-03-16 — End: 2024-03-17

## 2024-03-16 MED ORDER — PERFLUTREN LIPID MICROSPHERE
1.0000 mL | INTRAVENOUS | Status: AC | PRN
  Administered 2024-03-16: 2 mL via INTRAVENOUS

## 2024-03-16 NOTE — Progress Notes (Signed)
  Echocardiogram 2D Echocardiogram has been performed.  Tinnie FORBES Gosling RDCS 03/16/2024, 2:12 PM

## 2024-03-16 NOTE — Progress Notes (Signed)
   NAME:  Jeffery Graves, MRN:  [id number], DOB:  1956-01-16, LOS: 1 ADMISSION DATE:  03/15/2024, CONSULTATION DATE:  03/16/24 REFERRING MD:  Elmira, CHIEF COMPLAINT:  Cardiac Arrest    History of Present Illness:  Jeffery Graves is a 68 y.o. M with PMH significant for HTN and recurrent prostate Ca with obstructive voiding, last seen in urology clinic 03/06/24 and plan to proceed with salvage cryo for locally recurrent prostate cancer who was in a grocery store when he had a collapse with possible seizure like activity.  EMS happened to be in the store and started CPR in about one minute, found to be in a shockable rhythm and received about 4-6 defibrillations from the AED and approximately 15 mins CPR.  His initial EKG showed inferolateral STEMI.  Pt had ROSC at the time of ED arrival and was groaning but without purposeful movement.   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   Significant Hospital Events: Including procedures, antibiotic start and stop dates in addition to other pertinent events   8/15 STEMI, 15 minute Vfib arrest, cath, extubated  Interim History / Subjective:  Short term memory loss Passed swallow Denies pain  Objective    Blood pressure 117/78, pulse 69, temperature 99 F (37.2 C), resp. rate 17, SpO2 98%.    Vent Mode: PSV;CPAP FiO2 (%):  [60 %-100 %] 60 % Set Rate:  [22 bmp] 22 bmp Vt Set:  [510 mL] 510 mL PEEP:  [5 cmH20] 5 cmH20 Pressure Support:  [8 cmH20] 8 cmH20 Plateau Pressure:  [14 cmH20-15 cmH20] 14 cmH20   Intake/Output Summary (Last 24 hours) at 03/16/2024 1139 Last data filed at 03/16/2024 1000 Gross per 24 hour  Intake 1793.19 ml  Output 3610 ml  Net -1816.81 ml    There were no vitals filed for this visit.  No distress Keeps repeating questions Moves to command RASS 0 Heart sounds regular Lungs sound clear, nonlabored breathing  CBC/BMP look okay Echo pending  Resolved problem list   Assessment and Plan  Witnessed out of hospital Vfib cardiac arrest  Inferolateral STEMI s/p stent to distal RCA Post arrest anoxic encephalopathy- hopefully clears with time Hx prostate ca on hormone therapy Hx HTN  - PT/OT, reorient as needed - f/u echo, GDMT per cardiology - Stable to go to tele, appreciate TRH taking over 03/17/24  Rolan Sharps MD PCCM

## 2024-03-16 NOTE — Progress Notes (Signed)
 Advanced Heart Failure Rounding Note   Subjective:    8/15 admitted with OOH VF arrest in setting of acute inferolateral STEMI -> s/p PCI/DES RPL branch 8/16 extubated  Sitting up in chair. Asking when he can go home. Rhythm stable   Echo EF 40%   Objective:   Weight Range:  Vital Signs:   Temp:  [98.5 F (36.9 C)-99.9 F (37.7 C)] 98.5 F (36.9 C) (08/16 1551) Pulse Rate:  [62-81] 73 (08/16 1600) Resp:  [10-24] 15 (08/16 1600) BP: (103-148)/(67-121) 144/81 (08/16 1600) SpO2:  [96 %-100 %] 99 % (08/16 1600) Last BM Date :  (PTA)   Intake/Output:   Intake/Output Summary (Last 24 hours) at 03/16/2024 1933 Last data filed at 03/16/2024 1800 Gross per 24 hour  Intake 1250.69 ml  Output 3035 ml  Net -1784.31 ml     Physical Exam: General:  Sitting up in bed No resp difficulty HEENT: normal Neck: supple. JVP . Carotids 2+ bilat; no bruits. No lymphadenopathy or thryomegaly appreciated. Cor: PMI nondisplaced. Regular rate & rhythm. No rubs, gallops or murmurs. Lungs: clear Abdomen: soft, nontender, nondistended. No hepatosplenomegaly. No bruits or masses. Good bowel sounds. Extremities: no cyanosis, clubbing, rash, edema Neuro: alert & orientedx3, cranial nerves grossly intact. moves all 4 extremities w/o difficulty. Affect pleasant  Telemetry: Sinus 70s Personally reviewed  Labs: Basic Metabolic Panel: Recent Labs  Lab 03/15/24 1139 03/15/24 1221 03/15/24 1402 03/15/24 2114 03/16/24 0414 03/16/24 1020  NA 135 136  --  135 132* 136  K 3.9 2.4*  --  2.9* 3.5 3.6  CL 100  --   --  97* 99 102  CO2 21*  --   --  23 24 24   GLUCOSE 212*  --   --  100* 121* 114*  BUN 15  --   --  12 10 9   CREATININE 1.04  --   --  0.88 0.79 0.80  CALCIUM  8.6*  --   --  8.7* 8.4* 8.7*  MG  --   --  1.9 2.1 2.0  --     Liver Function Tests: Recent Labs  Lab 03/15/24 1139 03/16/24 0414  AST 132* 158*  ALT 111* 91*  ALKPHOS 86 70  BILITOT 1.1 0.8  PROT 7.0 6.3*   ALBUMIN 3.5 3.1*   No results for input(s): LIPASE, AMYLASE in the last 168 hours. No results for input(s): AMMONIA in the last 168 hours.  CBC: Recent Labs  Lab 03/15/24 1139 03/15/24 1221 03/16/24 0414  WBC 17.0*  --  10.2  NEUTROABS 12.0*  --   --   HGB 13.4 13.3 12.7*  HCT 41.9 39.0 39.9  MCV 83.8  --  84.5  PLT 213  --  191    Cardiac Enzymes: No results for input(s): CKTOTAL, CKMB, CKMBINDEX, TROPONINI in the last 168 hours.  BNP: BNP (last 3 results) No results for input(s): BNP in the last 8760 hours.  ProBNP (last 3 results) No results for input(s): PROBNP in the last 8760 hours.    Other results:  Imaging: ECHOCARDIOGRAM COMPLETE Result Date: 03/16/2024    ECHOCARDIOGRAM REPORT   Patient Name:   Jeffery Graves Date of Exam: 03/16/2024 Medical Rec #:  [id number]     Height:       66.0 in Accession #:    [id number]    Weight:       170.0 lb Date of Birth:  14-Sep-1955     BSA:  1.866 m Patient Age:    68 years      BP:           119/72 mmHg Patient Gender: M             HR:           75 bpm. Exam Location:  Inpatient Procedure: 2D Echo, Color Doppler, Cardiac Doppler and Intracardiac            Opacification Agent (Both Spectral and Color Flow Doppler were            utilized during procedure). Indications:     Cardiac Arrest I46.9  History:         Patient has no prior history of Echocardiogram examinations.                  Risk Factors:Hypertension.  Sonographer:     Tinnie Gosling RDCS Referring Phys:  [id number] TORIBIO SAUNDERS Nelida Mandarino Diagnosing Phys: Vina Gull MD IMPRESSIONS  1. Poor acoustic windows Definity  used. Hypokinesis of the inferoseptal, inferior, inferolateral walls (severe in mid segment). Left ventricular ejection fraction, by estimation, is 40%. There is mild concentric left ventricular hypertrophy. Left ventricular diastolic parameters are indeterminate.  2. Right ventricular systolic function is normal. The right ventricular size is  normal.  3. Trivial mitral valve regurgitation.  4. The aortic valve is tricuspid. Aortic valve regurgitation is not visualized.  5. The inferior vena cava is normal in size with greater than 50% respiratory variability, suggesting right atrial pressure of 3 mmHg. FINDINGS  Left Ventricle: Poor acoustic windows Definity  used. Hypokinesis of the inferoseptal, inferior, inferolateral walls (severe in mid segment). Left ventricular ejection fraction, by estimation, is 40%. Definity  contrast agent was given IV to delineate the  left ventricular endocardial borders. The left ventricular internal cavity size was small. There is mild concentric left ventricular hypertrophy. Left ventricular diastolic parameters are indeterminate. Right Ventricle: The right ventricular size is normal. Right vetricular wall thickness was not assessed. Right ventricular systolic function is normal. Left Atrium: Left atrial size was normal in size. Right Atrium: Right atrial size was normal in size. Pericardium: There is no evidence of pericardial effusion. Mitral Valve: There is mild thickening of the mitral valve leaflet(s). Trivial mitral valve regurgitation. Tricuspid Valve: The tricuspid valve is normal in structure. Tricuspid valve regurgitation is trivial. Aortic Valve: The aortic valve is tricuspid. Aortic valve regurgitation is not visualized. Pulmonic Valve: The pulmonic valve was not well visualized. Aorta: The aortic root and ascending aorta are structurally normal, with no evidence of dilitation. Venous: The inferior vena cava is normal in size with greater than 50% respiratory variability, suggesting right atrial pressure of 3 mmHg. IAS/Shunts: No atrial level shunt detected by color flow Doppler.  LEFT VENTRICLE PLAX 2D LVIDd:         3.20 cm      Diastology LVIDs:         2.60 cm      LV e' medial:    6.20 cm/s LV PW:         1.30 cm      LV E/e' medial:  13.4 LV IVS:        1.20 cm      LV e' lateral:   8.81 cm/s LVOT diam:      2.20 cm      LV E/e' lateral: 9.4 LV SV:         59 LV SV Index:   32 LVOT  Area:     3.80 cm  LV Volumes (MOD) LV vol d, MOD A2C: 126.0 ml LV vol d, MOD A4C: 76.3 ml LV vol s, MOD A2C: 69.1 ml LV vol s, MOD A4C: 64.6 ml LV SV MOD A2C:     56.9 ml LV SV MOD A4C:     76.3 ml LV SV MOD BP:      31.5 ml RIGHT VENTRICLE         IVC TAPSE (M-mode): 2.9 cm  IVC diam: 1.70 cm LEFT ATRIUM             Index        RIGHT ATRIUM           Index LA diam:        3.30 cm 1.77 cm/m   RA Area:     16.50 cm LA Vol (A2C):   65.4 ml 35.04 ml/m  RA Volume:   47.10 ml  25.24 ml/m LA Vol (A4C):   47.8 ml 25.61 ml/m LA Biplane Vol: 56.1 ml 30.06 ml/m  AORTIC VALVE LVOT Vmax:   83.00 cm/s LVOT Vmean:  57.600 cm/s LVOT VTI:    0.156 m  AORTA Ao Root diam: 2.80 cm Ao Asc diam:  3.70 cm MITRAL VALVE MV Area (PHT): 3.36 cm     SHUNTS MV Decel Time: 226 msec     Systemic VTI:  0.16 m MV E velocity: 82.80 cm/s   Systemic Diam: 2.20 cm MV A velocity: 105.00 cm/s MV E/A ratio:  0.79 Vina Gull MD Electronically signed by Vina Gull MD Signature Date/Time: 03/16/2024/4:13:24 PM    Final (Updated)    CT HEAD WO CONTRAST ( ) Result Date: 03/15/2024 EXAM: CT HEAD WITHOUT CONTRAST 03/15/2024 02:31:00 PM TECHNIQUE: CT of the head was performed without the administration of intravenous contrast. Automated exposure control, iterative reconstruction, and/or weight based adjustment of the mA/kV was utilized to reduce the radiation dose to as low as reasonably achievable. COMPARISON: None available. CLINICAL HISTORY: Mental status change, unknown cause. ams; Ams; Fall? FINDINGS: BRAIN AND VENTRICLES: There are encephalomalacia changes present at the right parietooccipital junction with expected dilatation of the posterior horn of the right lateral ventricle. No acute hemorrhage. Gray-white differentiation is preserved. No hydrocephalus. No extra-axial collection. No mass effect or midline shift. ORBITS: No acute abnormality. SINUSES: There is  mild mucosal disease within the left maxillary sinus. No acute abnormality. SOFT TISSUES AND SKULL: Orotracheal Tube and gastric tube are present. No acute soft tissue abnormality. No skull fracture. IMPRESSION: 1. No acute intracranial abnormality. 2. Encephalomalacia changes at the right parietooccipital junction with ex vacuo dilatation of the posterior horn of the right lateral ventricle. 3. Mild mucosal disease within the left maxillary sinus. Electronically signed by: evalene coho 03/15/2024 03:32 PM EDT RP Workstation: [id number]   CT CERVICAL SPINE WO CONTRAST Result Date: 03/15/2024 CLINICAL DATA:  Neck trauma (Age >= 65y). Altered mental status. Question fall EXAM: CT CERVICAL SPINE WITHOUT CONTRAST TECHNIQUE: Multidetector CT imaging of the cervical spine was performed without intravenous contrast. Multiplanar CT image reconstructions were also generated. RADIATION DOSE REDUCTION: This exam was performed according to the departmental dose-optimization program which includes automated exposure control, adjustment of the mA and/or kV according to patient size and/or use of iterative reconstruction technique. COMPARISON:  None Available. FINDINGS: Alignment: Normal. Skull base and vertebrae: No acute fracture. No aggressive appearing focal osseous lesion or focal pathologic process. Soft tissues and spinal canal: No prevertebral fluid or swelling. No  visible canal hematoma. Upper chest: Marked patchy peribronchovascular ground-glass and dependent consolidative airspace opacities. Other: Periapical lucencies along the posterior most remaining right mandibular tooth. Endotracheal tube in appropriate position noted on scout. Enteric tube partially visualized coursing within the esophagus. Acute displaced right anterior first rib fracture. IMPRESSION: 1. No acute displaced fracture or traumatic listhesis of the cervical spine. 2. Marked patchy peribronchovascular ground-glass and dependent consolidative  airspace opacities. Finding is not well visualized on chest x-ray 03/15/2024 3:08 p.m. 3. Acute displaced right anterior first rib fracture. No associated right apical pneumothorax. 4. Endotracheal tube in appropriate position. 5. Recommend CT chest with intravenous contrast for further evaluation. 6. Please see separately dictated CT head 03/15/2024. Electronically Signed   By: Morgane  Naveau M.D.   On: 03/15/2024 15:30   DG CHEST PORT 1 VIEW Result Date: 03/15/2024 CLINICAL DATA:  Endotracheal tube placement. EXAM: PORTABLE CHEST 1 VIEW COMPARISON:  Same day. FINDINGS: Endotracheal tube is in grossly good position. Nasogastric tube is seen entering stomach. IMPRESSION: Endotracheal tube in grossly good position. Electronically Signed   By: Lynwood Landy Raddle M.D.   On: 03/15/2024 15:08   DG Abdomen 1 View Result Date: 03/15/2024 CLINICAL DATA:  Orogastric tube placement. EXAM: ABDOMEN - 1 VIEW COMPARISON:  None Available. FINDINGS: Distal tip of nasogastric tube is seen in expected position of proximal stomach. No abnormal bowel dilatation. IMPRESSION: Nasogastric tube tip seen in proximal stomach. Electronically Signed   By: Lynwood Landy Raddle M.D.   On: 03/15/2024 15:08   CARDIAC CATHETERIZATION Result Date: 03/15/2024 Images from the original result were not included. Coronary angiography & intervention 03/15/2024: LM: Normal LAD: Minimal luminal irregularities Lcx: Minimal luminal irregularities RCA: Large, dominant vessel          Occluded distal R PLA, culprit lesion          Focal 80% stenosis in small caliber RPDA, not culprit lesion LVEDP 5 mmHg Successful percutaneous coronary intervention RPLA        PTCA and stent placement 2.25 X 20 mm Synergy drug-eluting stent        Postdilatation using 2.5 x 12 mm Carrier balloon up to 20 atm Patient is hemodynamically and electrically stable.  Admit to ICU.  Prognosis depends on neurological recovery. Newman JINNY Lawrence, MD  DG Chest Portable 1 View Result  Date: 03/15/2024 CLINICAL DATA:  Acute myocardial infarction. EXAM: PORTABLE CHEST 1 VIEW COMPARISON:  05/28/2008 FINDINGS: No significant change in enlargement of the cardiac silhouette. Clear lungs with normal vascularity. Endotracheal tube with its tip 1.9 cm above the carina. It is recommended that this be retracted 4 cm. Nasogastric tube tip in the proximal stomach and side hole in the region of the gastroesophageal junction. It is recommended that this be advanced 6 cm. Mild scoliosis. IMPRESSION: 1. Endotracheal tube tip 1.9 cm above the carina. It is recommended that this be retracted 4 cm. 2. Nasogastric tube tip in the proximal stomach and side hole in the region of the gastroesophageal junction. It is recommended that this be advanced 6 cm. 3. Stable cardiomegaly. Electronically Signed   By: Elspeth Bathe M.D.   On: 03/15/2024 12:19     Medications:     Scheduled Medications:  aspirin   81 mg Oral Daily   atorvastatin   80 mg Oral Daily   Chlorhexidine  Gluconate Cloth  6 each Topical Daily   clopidogrel   600 mg Oral Once   Followed by   [START ON 03/17/2024] clopidogrel   75 mg Oral  Daily   insulin  aspart  0-15 Units Subcutaneous Q4H   lidocaine   2 patch Transdermal Q24H   mouth rinse  15 mL Mouth Rinse TID   polyethylene glycol  17 g Oral Daily   sodium chloride  flush  3 mL Intravenous Q12H    Infusions:  potassium chloride  100 mL/hr at 03/16/24 0800    PRN Medications: acetaminophen , ondansetron  (ZOFRAN ) IV, mouth rinse, potassium chloride , sodium chloride  flush   Assessment/Plan:   1. OOH VF Cardiac Arrest  - Witnessed, CPR initiated ~1 min after found down, total ~15 min CPR, 6 defibs, epi and amio  - posturing on arrival  - post arrest EKG w/ anterolateral STE. Emergent cath w/ right LPB occlusion (culprit). S/p PCI  - Echo 40%  2. CAD with inferolateral STEMI/CAD - Culprit on cath was occlusion of large RPLB, s/p PCI. No other disease   - Continue DAPT (Brilinta   changed to Plavix  due to interaction with prostate meds)  3. Ischemic CM - Echo EF 40% - Start GDMT   4. Hypoxic Respiratory Failure - intubated  - extubated 8/16   5. Prostate Cancer  - s/p recent prostate cryoablation on 8/6/2  Can got to the floor   Length of Stay: 1   Toribio Fuel MD 03/16/2024, 7:33 PM  Advanced Heart Failure Team Pager [phone] (M-F; 7a - 4p)  Please contact [facility] for night-coverage after hours (4p -7a ) and weekends on [email or web address]

## 2024-03-16 NOTE — Progress Notes (Signed)
 PT Cancellation Note  Patient Details Name: Jeffery Graves MRN: [id number] DOB: [birth year]   Cancelled Treatment:    Reason Eval/Treat Not Completed: Patient at procedure or test/unavailable  US  in room for echo. Will check back later this afternoon for functional evaluation with physical therapy.  Leontine Roads, PT, DPT [facility]  Rehabilitation Services Physical Therapist Office: [phone] Website: [email or web address]   Leontine GORMAN Roads 03/16/2024, 1:43 PM

## 2024-03-16 NOTE — Evaluation (Signed)
 Physical Therapy Evaluation Patient Details Name: Jeffery Graves MRN: [id number] DOB: 07-02-1956 Today's Date: 03/16/2024  History of Present Illness  68 y.o. M admitted 8/15 for STEMI, 15 minute Vfib arrest. S/p Cardiac stent distal RCA.  PMH significant for HTN and recurrent prostate Ca  Clinical Impression  Pt admitted with above diagnosis. 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 Requires CGA with bed mobility, transfer, and gait up to 450 feet today, no assistive device. SpO2 99-100% on RA, HR within target goal (65-106). Cognition impaired, especially short term memory. Could not recall 3 items after 1 minute. States he has a brother that can assist him at d/c as needed. Anticipate good recovery from a physical/functional standpoint. May have more cognitive/OT needs pending progress. Will follow and progress acutely. Pt currently with functional limitations due to the deficits listed below (see PT Problem List). Pt will benefit from acute skilled PT to increase their independence and safety with mobility to allow discharge.           If plan is discharge home, recommend the following: Supervision due to cognitive status;Help with stairs or ramp for entrance;Assist for transportation;Direct supervision/assist for medications management;Direct supervision/assist for financial management;Assistance with cooking/housework   Can travel by private vehicle        Equipment Recommendations None recommended by PT  Recommendations for Other Services       Functional Status Assessment Patient has had a recent decline in their functional status and demonstrates the ability to make significant improvements in function in a reasonable and predictable amount of time.     Precautions / Restrictions Precautions Precautions: Fall Recall of Precautions/Restrictions: Impaired Restrictions Weight Bearing Restrictions Per Provider Order: No      Mobility  Bed Mobility Overal  bed mobility: Needs Assistance Bed Mobility: Supine to Sit     Supine to sit: Contact guard     General bed mobility comments: CGA for safety, extra time, somewhat uncomfortable through sternum. Cues for technique.    Transfers Overall transfer level: Needs assistance Equipment used: None Transfers: Sit to/from Stand Sit to Stand: Contact guard assist           General transfer comment: CGA for safety to stand and management of lines/leads/foley. Wide BOS mild sway but improved quickly. No physical assist needed.    Ambulation/Gait Ambulation/Gait assistance: Contact guard assist Gait Distance (Feet): 450 Feet Assistive device: None Gait Pattern/deviations: Step-through pattern, Wide base of support, Drifts right/left Gait velocity: dec Gait velocity interpretation: <1.8 ft/sec, indicate of risk for recurrent falls   General Gait Details: Minor drift, reduced speed, able to self correct instability. CGA for safety without assistive device. SpO2 maintained mid to high 90s throughout session, no dyspnea. HR maintained within target range (65-105) throughout session.  Stairs            Wheelchair Mobility     Tilt Bed    Modified Rankin (Stroke Patients Only)       Balance Overall balance assessment: Mild deficits observed, not formally tested                                           Pertinent Vitals/Pain Pain Assessment Pain Assessment: Faces Faces Pain Scale: Hurts little more Pain Location: sternum intermittently. Pain Descriptors / Indicators: Aching Pain Intervention(s): Limited activity within patient's tolerance, Monitored during session, Repositioned    Home Living  Family/patient expects to be discharged to:: Private residence Living Arrangements: Alone Available Help at Discharge: Family;Available PRN/intermittently (Brother) Type of Home: House Home Access: Stairs to enter Entrance Stairs-Rails: None Entrance  Stairs-Number of Steps: 2   Home Layout: One level Home Equipment: None      Prior Function Prior Level of Function : Independent/Modified Independent;Driving;Working/employed             Mobility Comments: Ind no issues PTA ADLs Comments: Ind, driving, 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 Oriented to self, DOB, city, that he is in a hosptial, (requires cues to recall [place],) states he fell at [employer] but not sure why he was admitted. Able to name 3 animals that walk on 4 legs, can add a quarter and dime. Following commands: Intact       Cueing Cueing Techniques: Verbal cues, Gestural cues     General Comments General comments (skin integrity, edema, etc.): VSS    Exercises     Assessment/Plan    PT Assessment Patient needs continued PT services  PT Problem List Decreased activity tolerance;Decreased balance;Decreased mobility;Decreased cognition;Decreased knowledge of use of DME;Decreased safety awareness;Decreased knowledge of precautions;Cardiopulmonary status limiting activity;Pain       PT Treatment Interventions DME instruction;Gait training;Stair training;Functional mobility training;Therapeutic activities;Therapeutic exercise;Balance training;Neuromuscular re-education;Cognitive remediation;Patient/family education    PT Goals (Current goals can be found in the Care Plan  section)  Acute Rehab PT Goals Patient Stated Goal: go home soon PT Goal Formulation: With patient Time For Goal Achievement: 03/30/24 Potential to Achieve Goals: Good    Frequency Min 2X/week     Co-evaluation               AM-PAC PT 6 Clicks Mobility  Outcome Measure Help needed turning from your back to your side while in a flat bed without using bedrails?: A Little Help needed moving from lying on your back to sitting on the side of a flat bed without using bedrails?: A Little Help needed moving to and from a bed to a chair (including a wheelchair)?: A Little Help needed standing up from a chair using your arms (e.g., wheelchair or bedside chair)?: A Little Help needed to walk in hospital room?: A Little Help needed climbing 3-5 steps with a railing? : A Little 6 Click Score: 18    End of Session Equipment Utilized During Treatment: Gait belt Activity Tolerance: Patient tolerated treatment well Patient left: in bed;with call bell/phone within reach;with bed alarm set Nurse Communication: Mobility status PT Visit Diagnosis: Unsteadiness on feet (R26.81);Other abnormalities of gait and mobility (R26.89);Pain Pain - part of body:  (Sternum)    Time: 8396-8368 PT Time Calculation (min) (ACUTE ONLY): 28 min   Charges:   PT Evaluation $PT Eval Low Complexity: 1 Low PT Treatments $Gait Training: 8-22 mins PT General Charges $$ ACUTE PT VISIT: 1 Visit  Leontine Roads, PT, DPT [facility]  Rehabilitation Services Physical Therapist Office: [phone] Website: [email or web address]   Leontine GORMAN Roads 03/16/2024, 5:05 PM

## 2024-03-16 NOTE — Discharge Instructions (Signed)

## 2024-03-17 ENCOUNTER — Encounter (HOSPITAL_COMMUNITY): Payer: Self-pay | Admitting: Cardiology

## 2024-03-17 ENCOUNTER — Other Ambulatory Visit: Payer: Self-pay

## 2024-03-17 DIAGNOSIS — I213 ST elevation (STEMI) myocardial infarction of unspecified site: Secondary | ICD-10-CM

## 2024-03-17 DIAGNOSIS — I469 Cardiac arrest, cause unspecified: Secondary | ICD-10-CM | POA: Diagnosis not present

## 2024-03-17 DIAGNOSIS — I255 Ischemic cardiomyopathy: Secondary | ICD-10-CM | POA: Diagnosis not present

## 2024-03-17 DIAGNOSIS — I2510 Atherosclerotic heart disease of native coronary artery without angina pectoris: Secondary | ICD-10-CM | POA: Diagnosis not present

## 2024-03-17 LAB — GLUCOSE, CAPILLARY
Glucose-Capillary: 103 mg/dL — ABNORMAL HIGH (ref 70–99)
Glucose-Capillary: 104 mg/dL — ABNORMAL HIGH (ref 70–99)
Glucose-Capillary: 120 mg/dL — ABNORMAL HIGH (ref 70–99)
Glucose-Capillary: 131 mg/dL — ABNORMAL HIGH (ref 70–99)
Glucose-Capillary: 86 mg/dL (ref 70–99)
Glucose-Capillary: 93 mg/dL (ref 70–99)
Glucose-Capillary: 93 mg/dL (ref 70–99)

## 2024-03-17 LAB — CBC
HCT: 37.7 % — ABNORMAL LOW (ref 39.0–52.0)
Hemoglobin: 12.3 g/dL — ABNORMAL LOW (ref 13.0–17.0)
MCH: 26.9 pg (ref 26.0–34.0)
MCHC: 32.6 g/dL (ref 30.0–36.0)
MCV: 82.3 fL (ref 80.0–100.0)
Platelets: 177 K/uL (ref 150–400)
RBC: 4.58 MIL/uL (ref 4.22–5.81)
RDW: 14.2 % (ref 11.5–15.5)
WBC: 7.8 K/uL (ref 4.0–10.5)
nRBC: 0 % (ref 0.0–0.2)

## 2024-03-17 LAB — BASIC METABOLIC PANEL WITH GFR
Anion gap: 8 (ref 5–15)
BUN: 9 mg/dL (ref 8–23)
CO2: 24 mmol/L (ref 22–32)
Calcium: 8.8 mg/dL — ABNORMAL LOW (ref 8.9–10.3)
Chloride: 106 mmol/L (ref 98–111)
Creatinine, Ser: 0.87 mg/dL (ref 0.61–1.24)
GFR, Estimated: >60 mL/min (ref >60)
Glucose, Bld: 96 mg/dL (ref 70–99)
Potassium: 3.6 mmol/L (ref 3.5–5.1)
Sodium: 138 mmol/L (ref 135–145)

## 2024-03-17 LAB — MAGNESIUM: Magnesium: 1.9 mg/dL (ref 1.7–2.4)

## 2024-03-17 MED ORDER — ORAL CARE MOUTH RINSE: 15.0000 mL | OROMUCOSAL | Status: DC | PRN

## 2024-03-17 MED ORDER — POTASSIUM CHLORIDE CRYS ER 20 MEQ PO TBCR
40.0000 meq | EXTENDED_RELEASE_TABLET | Freq: Every day | ORAL | Status: DC
  Administered 2024-03-17 – 2024-03-19 (×3): 40 meq via ORAL
  Filled 2024-03-17 (×3): qty 2

## 2024-03-17 MED ORDER — SPIRONOLACTONE 25 MG PO TABS
25.0000 mg | ORAL_TABLET | Freq: Every day | ORAL | Status: DC
  Administered 2024-03-17 – 2024-03-19 (×3): 25 mg via ORAL
  Filled 2024-03-17 (×3): qty 1

## 2024-03-17 MED ORDER — MAGNESIUM OXIDE -MG SUPPLEMENT 400 (240 MG) MG PO TABS
400.0000 mg | ORAL_TABLET | Freq: Every day | ORAL | Status: DC
  Administered 2024-03-17 – 2024-03-19 (×3): 400 mg via ORAL
  Filled 2024-03-17 (×3): qty 1

## 2024-03-17 NOTE — Progress Notes (Signed)
 Progress Note   Patient: Jeffery Graves FMW:[id number] DOB: July 04, 1956 DOA: 03/15/2024     2 DOS: the patient was seen and examined on 03/17/2024   Brief hospital course: Jeffery Graves is a 68 y.o. M with PMH significant for HTN and recurrent prostate Ca with obstructive voiding, last seen in urology clinic 03/06/24 and plan to proceed with salvage cryo for locally recurrent prostate cancer who was in a grocery store when he had a collapse with possible seizure like activity.  EMS happened to be in the store and started CPR in about one minute, found to be in a shockable rhythm and received about 4-6 defibrillations from the AED and approximately 15 mins CPR.  His initial EKG showed inferolateral STEMI.  Pt had ROSC at the time of ED arrival and was groaning but without purposeful movement.    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 Patient transferred to TRH service 03/17/24 for further management.  Assessment and Plan: S/p Cardiac arrest-outside of hospital. S/p CPR, ACLS protocol. S/p emergent cath PCI to r PLA. Cardiology follow up appreciated. Continue DAPT, statin.  Systolic CHF. Ischemic cardiomyopathy. EF 40%. Continue metoprolol , Losartan , aldactone  per cardiology recommendations.  Hypoxic Respiratory Failure S/p cardiac arrest. S/p extubated 8/16, continue to monitor saturation closely.  Hypertension- Continue GDMT. BP stable.   Prostate Cancer  s/p recent prostate cryoablation. Follow urology as outpatient.        Out of bed to chair. Incentive spirometry. Nursing supportive care. Fall, aspiration precautions. Diet:  Diet Orders (From admission, onward)     Start     Ordered   03/16/24 1446  Diet heart healthy/carb modified Room service appropriate? Yes with Assist; Fluid consistency: Thin  Diet effective now       Question Answer Comment  Diet-HS Snack? Nothing    Room service appropriate? Yes with Assist   Fluid consistency: Thin      03/16/24 1445           DVT prophylaxis: SCD's Start: 03/15/24 1323  Level of care: Telemetry Cardiac   Code Status: Full Code  Subjective: Patient is seen and examined today morning. He is sitting in chair. Denies chest pain. Wishes to go home.  Physical Exam: Vitals:   03/17/24 0900 03/17/24 1000 03/17/24 1100 03/17/24 1200  BP: (!) 124/101 (!) 145/79  (!) 120/90  Pulse: 76 69 66 71  Resp: 19 19 16  (!) 26  Temp:    99.1 F (37.3 C)  TempSrc:      SpO2: 100% 99% 99% 95%    General - Elderly African American male, no apparent distress HEENT - PERRLA, EOMI, atraumatic head, non tender sinuses. Lung - Clear, basal rales, rhonchi, no wheezes. Heart - S1, S2 heard, no murmurs, rubs, trace pedal edema. Abdomen - Soft, non tender, bowel sounds good, foley with dark urine. Neuro - Alert, awake and oriented x 3, non focal exam. Skin - Warm and dry.  Data Reviewed:      Latest Ref Rng & Units 03/17/2024    4:23 AM 03/16/2024    4:14 AM 03/15/2024   12:21 PM  CBC  WBC 4.0 - 10.5 K/uL 7.8  10.2    Hemoglobin 13.0 - 17.0 g/dL 87.6  87.2  86.6   Hematocrit 39.0 - 52.0 % 37.7  39.9  39.0   Platelets 150 - 400 K/uL 177  191        Latest Ref Rng & Units 03/17/2024    4:23 AM 03/16/2024   10:20 AM 03/16/2024    4:14 AM  BMP  Glucose 70 - 99 mg/dL 96  885  878   BUN 8 - 23 mg/dL 9  9  10    Creatinine 0.61 - 1.24 mg/dL 9.12  9.19  9.20   Sodium 135 - 145 mmol/L 138  136  132   Potassium 3.5 - 5.1 mmol/L 3.6  3.6  3.5   Chloride 98 - 111 mmol/L 106  102  99   CO2 22 - 32 mmol/L 24  24  24    Calcium  8.9 - 10.3 mg/dL 8.8  8.7  8.4    ECHOCARDIOGRAM COMPLETE Result Date: 03/16/2024    ECHOCARDIOGRAM REPORT   Patient Name:   Jeffery Graves Date of Exam: 03/16/2024 Medical Rec #:  [id number]     Height:       66.0 in Accession #:    [id number]    Weight:       170.0 lb Date of Birth:  07/04/1956     BSA:           1.866 m Patient Age:    68 years      BP:           119/72 mmHg Patient Gender: M             HR:           75 bpm. Exam Location:  Inpatient Procedure: 2D Echo, Color Doppler, Cardiac Doppler and Intracardiac            Opacification Agent (Both Spectral and Color Flow Doppler were            utilized during procedure). Indications:     Cardiac Arrest I46.9  History:         Patient has no prior history of Echocardiogram examinations.                  Risk Factors:Hypertension.  Sonographer:     Tinnie Gosling RDCS Referring Phys:  [id number] TORIBIO SAUNDERS BENSIMHON Diagnosing Phys: Vina Gull MD IMPRESSIONS  1. Poor acoustic windows Definity  used. Hypokinesis of the inferoseptal, inferior, inferolateral walls (severe in mid segment). Left ventricular ejection fraction, by estimation, is 40%. There is mild concentric left ventricular hypertrophy. Left ventricular diastolic parameters are indeterminate.  2. Right ventricular systolic function is normal. The right ventricular size is normal.  3. Trivial mitral valve regurgitation.  4. The aortic valve is tricuspid. Aortic valve regurgitation is not visualized.  5. The inferior vena cava is normal in size with greater than 50% respiratory variability, suggesting right atrial pressure of 3 mmHg. FINDINGS  Left Ventricle: Poor acoustic windows Definity  used. Hypokinesis of the inferoseptal, inferior, inferolateral walls (severe in mid segment). Left ventricular ejection fraction, by estimation, is 40%. Definity  contrast agent was given IV to delineate the  left ventricular endocardial borders. The left ventricular internal cavity size was small. There is mild concentric left ventricular hypertrophy. Left ventricular diastolic parameters are indeterminate. Right Ventricle: The right ventricular size is normal. Right vetricular wall thickness was not assessed. Right ventricular systolic function is normal. Left Atrium: Left atrial size was normal in size. Right Atrium: Right  atrial size was normal in size. Pericardium: There is no evidence of pericardial effusion. Mitral Valve: There is mild thickening of the mitral valve leaflet(s). Trivial mitral valve regurgitation. Tricuspid  Valve: The tricuspid valve is normal in structure. Tricuspid valve regurgitation is trivial. Aortic Valve: The aortic valve is tricuspid. Aortic valve regurgitation is not visualized. Pulmonic Valve: The pulmonic valve was not well visualized. Aorta: The aortic root and ascending aorta are structurally normal, with no evidence of dilitation. Venous: The inferior vena cava is normal in size with greater than 50% respiratory variability, suggesting right atrial pressure of 3 mmHg. IAS/Shunts: No atrial level shunt detected by color flow Doppler.  LEFT VENTRICLE PLAX 2D LVIDd:         3.20 cm      Diastology LVIDs:         2.60 cm      LV e' medial:    6.20 cm/s LV PW:         1.30 cm      LV E/e' medial:  13.4 LV IVS:        1.20 cm      LV e' lateral:   8.81 cm/s LVOT diam:     2.20 cm      LV E/e' lateral: 9.4 LV SV:         59 LV SV Index:   32 LVOT Area:     3.80 cm  LV Volumes (MOD) LV vol d, MOD A2C: 126.0 ml LV vol d, MOD A4C: 76.3 ml LV vol s, MOD A2C: 69.1 ml LV vol s, MOD A4C: 64.6 ml LV SV MOD A2C:     56.9 ml LV SV MOD A4C:     76.3 ml LV SV MOD BP:      31.5 ml RIGHT VENTRICLE         IVC TAPSE (M-mode): 2.9 cm  IVC diam: 1.70 cm LEFT ATRIUM             Index        RIGHT ATRIUM           Index LA diam:        3.30 cm 1.77 cm/m   RA Area:     16.50 cm LA Vol (A2C):   65.4 ml 35.04 ml/m  RA Volume:   47.10 ml  25.24 ml/m LA Vol (A4C):   47.8 ml 25.61 ml/m LA Biplane Vol: 56.1 ml 30.06 ml/m  AORTIC VALVE LVOT Vmax:   83.00 cm/s LVOT Vmean:  57.600 cm/s LVOT VTI:    0.156 m  AORTA Ao Root diam: 2.80 cm Ao Asc diam:  3.70 cm MITRAL VALVE MV Area (PHT): 3.36 cm     SHUNTS MV Decel Time: 226 msec     Systemic VTI:  0.16 m MV E velocity: 82.80 cm/s   Systemic Diam: 2.20 cm MV A velocity: 105.00 cm/s  MV E/A ratio:  0.79 Vina Gull MD Electronically signed by Vina Gull MD Signature Date/Time: 03/16/2024/4:13:24 PM    Final (Updated)    CT HEAD WO CONTRAST ( ) Result Date: 03/15/2024 EXAM: CT HEAD WITHOUT CONTRAST 03/15/2024 02:31:00 PM TECHNIQUE: CT of the head was performed without the administration of intravenous contrast. Automated exposure control, iterative reconstruction, and/or weight based adjustment of the mA/kV was utilized to reduce the radiation dose to as low as reasonably achievable. COMPARISON: None available. CLINICAL HISTORY: Mental status change, unknown cause. ams; Ams; Fall? FINDINGS: BRAIN AND VENTRICLES: There are encephalomalacia changes present at the right parietooccipital junction with expected dilatation of the posterior horn of the right lateral ventricle. No acute hemorrhage. Gray-white differentiation is preserved. No hydrocephalus. No extra-axial collection. No  mass effect or midline shift. ORBITS: No acute abnormality. SINUSES: There is mild mucosal disease within the left maxillary sinus. No acute abnormality. SOFT TISSUES AND SKULL: Orotracheal Tube and gastric tube are present. No acute soft tissue abnormality. No skull fracture. IMPRESSION: 1. No acute intracranial abnormality. 2. Encephalomalacia changes at the right parietooccipital junction with ex vacuo dilatation of the posterior horn of the right lateral ventricle. 3. Mild mucosal disease within the left maxillary sinus. Electronically signed by: evalene coho 03/15/2024 03:32 PM EDT RP Workstation: [id number]   CT CERVICAL SPINE WO CONTRAST Result Date: 03/15/2024 CLINICAL DATA:  Neck trauma (Age >= 65y). Altered mental status. Question fall EXAM: CT CERVICAL SPINE WITHOUT CONTRAST TECHNIQUE: Multidetector CT imaging of the cervical spine was performed without intravenous contrast. Multiplanar CT image reconstructions were also generated. RADIATION DOSE REDUCTION: This exam was performed according to the  departmental dose-optimization program which includes automated exposure control, adjustment of the mA and/or kV according to patient size and/or use of iterative reconstruction technique. COMPARISON:  None Available. FINDINGS: Alignment: Normal. Skull base and vertebrae: No acute fracture. No aggressive appearing focal osseous lesion or focal pathologic process. Soft tissues and spinal canal: No prevertebral fluid or swelling. No visible canal hematoma. Upper chest: Marked patchy peribronchovascular ground-glass and dependent consolidative airspace opacities. Other: Periapical lucencies along the posterior most remaining right mandibular tooth. Endotracheal tube in appropriate position noted on scout. Enteric tube partially visualized coursing within the esophagus. Acute displaced right anterior first rib fracture. IMPRESSION: 1. No acute displaced fracture or traumatic listhesis of the cervical spine. 2. Marked patchy peribronchovascular ground-glass and dependent consolidative airspace opacities. Finding is not well visualized on chest x-ray 03/15/2024 3:08 p.m. 3. Acute displaced right anterior first rib fracture. No associated right apical pneumothorax. 4. Endotracheal tube in appropriate position. 5. Recommend CT chest with intravenous contrast for further evaluation. 6. Please see separately dictated CT head 03/15/2024. Electronically Signed   By: Morgane  Naveau M.D.   On: 03/15/2024 15:30   DG CHEST PORT 1 VIEW Result Date: 03/15/2024 CLINICAL DATA:  Endotracheal tube placement. EXAM: PORTABLE CHEST 1 VIEW COMPARISON:  Same day. FINDINGS: Endotracheal tube is in grossly good position. Nasogastric tube is seen entering stomach. IMPRESSION: Endotracheal tube in grossly good position. Electronically Signed   By: Lynwood Landy Raddle M.D.   On: 03/15/2024 15:08   DG Abdomen 1 View Result Date: 03/15/2024 CLINICAL DATA:  Orogastric tube placement. EXAM: ABDOMEN - 1 VIEW COMPARISON:  None Available. FINDINGS:  Distal tip of nasogastric tube is seen in expected position of proximal stomach. No abnormal bowel dilatation. IMPRESSION: Nasogastric tube tip seen in proximal stomach. Electronically Signed   By: Lynwood Landy Raddle M.D.   On: 03/15/2024 15:08    Family Communication: Discussed with patient, understand and agree. All questions answered.  Disposition: Status is: Inpatient Cardiac work up  Planned Discharge Destination: Home     Time spent: 44 minutes  Author: Concepcion Riser, MD 03/17/2024 1:27 PM Secure chat 7am to 7pm For on call review [email or web address].

## 2024-03-17 NOTE — Progress Notes (Addendum)
 Patient called RN to room, anxious, pt insistent that he left $70 cash and money order in the closet in his room.  RN went to 2H21 searched room, no personal belongings left in room.  No envelope in chart for personal belongings being sent to security.  2H charge nurse, Arlyss called and discussed.  Requested charge to check in patient chart for possible security envelope.  Will have management team follow up with patient in am.  2325 Went to check on patient, patient states he found money and money order.  Patient oriented x3.

## 2024-03-17 NOTE — Plan of Care (Signed)
  Problem: Education: Goal: Knowledge of General Education information will improve Description: Including pain rating scale, medication(s)/side effects and non-pharmacologic comfort measures Outcome: Progressing   Problem: Health Behavior/Discharge Planning: Goal: Ability to manage health-related needs will improve Outcome: Progressing   Problem: Clinical Measurements: Goal: Ability to maintain clinical measurements within normal limits will improve Outcome: Progressing Goal: Will remain free from infection Outcome: Progressing Goal: Diagnostic test results will improve Outcome: Progressing Goal: Respiratory complications will improve Outcome: Progressing Goal: Cardiovascular complication will be avoided Outcome: Progressing   Problem: Activity: Goal: Risk for activity intolerance will decrease Outcome: Progressing   Problem: Nutrition: Goal: Adequate nutrition will be maintained Outcome: Progressing   Problem: Coping: Goal: Level of anxiety will decrease Outcome: Progressing   Problem: Elimination: Goal: Will not experience complications related to bowel motility Outcome: Progressing Goal: Will not experience complications related to urinary retention Outcome: Progressing   Problem: Pain Managment: Goal: General experience of comfort will improve and/or be controlled Outcome: Progressing   Problem: Safety: Goal: Ability to remain free from injury will improve Outcome: Progressing   Problem: Skin Integrity: Goal: Risk for impaired skin integrity will decrease Outcome: Progressing   Problem: Education: Goal: Understanding of CV disease, CV risk reduction, and recovery process will improve Outcome: Progressing Goal: Individualized Educational Video(s) Outcome: Progressing   Problem: Cardiovascular: Goal: Ability to achieve and maintain adequate cardiovascular perfusion will improve Outcome: Progressing   Problem: Health Behavior/Discharge Planning: Goal:  Ability to safely manage health-related needs after discharge will improve Outcome: Progressing   Problem: Education: Goal: Ability to describe self-care measures that may prevent or decrease complications (Diabetes Survival Skills Education) will improve Outcome: Progressing Goal: Individualized Educational Video(s) Outcome: Progressing   Problem: Coping: Goal: Ability to adjust to condition or change in health will improve Outcome: Progressing   Problem: Fluid Volume: Goal: Ability to maintain a balanced intake and output will improve Outcome: Progressing   Problem: Health Behavior/Discharge Planning: Goal: Ability to identify and utilize available resources and services will improve Outcome: Progressing Goal: Ability to manage health-related needs will improve Outcome: Progressing   Problem: Nutritional: Goal: Maintenance of adequate nutrition will improve Outcome: Progressing Goal: Progress toward achieving an optimal weight will improve Outcome: Progressing   Problem: Skin Integrity: Goal: Risk for impaired skin integrity will decrease Outcome: Progressing

## 2024-03-17 NOTE — Evaluation (Signed)
 Occupational Therapy Evaluation Patient Details Name: Jeffery Graves MRN: [id number] DOB: [birth year] Today's Date: 03/17/2024   History of Present Illness   68 y.o. M admitted 8/15 for STEMI, 15 minute Vfib arrest. S/p Cardiac stent distal RCA. ETT 8/15-8/16 PMH significant for HTN and recurrent prostate Ca     Clinical Impressions Pt ind at baseline with ADLs/functional mobility, lives alone but reports can stay with his niece at d/c if needed. Pt with cognitive impairments, needing supervision for ADLs and will need supervision for med mgmt/IADLs at d/c as well. Pt CGA for ambulation in hall, mild sway noted while dual tasking. Pt needs min cues to find way back to room, cues to orient why he is in the hospital. Pt presenting with impairments listed below, will follow acutely. Recommend OP OT at d/c given pt has frequent assist upon returning home.      If plan is discharge home, recommend the following:   A little help with walking and/or transfers;A little help with bathing/dressing/bathroom;Direct supervision/assist for medications management;Direct supervision/assist for financial management;Assist for transportation;Help with stairs or ramp for entrance;Supervision due to cognitive status     Functional Status Assessment   Patient has had a recent decline in their functional status and demonstrates the ability to make significant improvements in function in a reasonable and predictable amount of time.     Equipment Recommendations   None recommended by OT     Recommendations for Other Services   Speech consult (cognition)     Precautions/Restrictions   Precautions Precautions: Fall Recall of Precautions/Restrictions: Impaired Restrictions Weight Bearing Restrictions Per Provider Order: No     Mobility Bed Mobility               General bed mobility comments: OOB in chair upon arrival & departure    Transfers Overall transfer level: Needs  assistance Equipment used: None Transfers: Sit to/from Stand Sit to Stand: Contact guard assist           General transfer comment: incr sway/mild LOB with dual tasking during hall ambulation      Balance Overall balance assessment: Mild deficits observed, not formally tested                                         ADL either performed or assessed with clinical judgement   ADL Overall ADL's : Needs assistance/impaired Eating/Feeding: Set up   Grooming: Supervision/safety   Upper Body Bathing: Supervision/ safety   Lower Body Bathing: Supervison/ safety   Upper Body Dressing : Supervision/safety   Lower Body Dressing: Supervision/safety   Toilet Transfer: Supervision/safety   Toileting- Clothing Manipulation and Hygiene: Supervision/safety       Functional mobility during ADLs: Supervision/safety       Vision   Vision Assessment?: No apparent visual deficits     Perception Perception: Not tested       Praxis Praxis: Not tested       Pertinent Vitals/Pain Pain Assessment Pain Assessment: Faces Pain Score: 4  Faces Pain Scale: Hurts little more Pain Location: sternum with cough Pain Descriptors / Indicators: Discomfort Pain Intervention(s): Limited activity within patient's tolerance, Monitored during session, Repositioned     Extremity/Trunk Assessment         Cervical / Trunk Assessment Cervical / Trunk Assessment: Normal   Communication Communication Communication: No apparent difficulties   Cognition Arousal: Alert Behavior During Therapy:  WFL for tasks assessed/performed Cognition: Cognition impaired   Orientation impairments: Situation, Place (knows hospital, and he was at a gas station PTA) Awareness: Online awareness impaired, Intellectual awareness impaired (states he is at the hospital for placement of catheter) Memory impairment (select all impairments): Short-term memory, Working memory Attention impairment  (select first level of impairment): Selective attention Executive functioning impairment (select all impairments): Problem solving, Initiation, Sequencing OT - Cognition Comments: pt unable to recall month of year during administration of SBT, can count backward 20-1 but unable to state months of year backward or initiate task, unaware of reason for hospital admission                 Following commands: Intact       Cueing  General Comments   Cueing Techniques: Verbal cues;Gestural cues  VSS on RA   Exercises     Shoulder Instructions      Home Living Family/patient expects to be discharged to:: Private residence Living Arrangements: Alone Available Help at Discharge: Family;Available PRN/intermittently (brother and niece) Type of Home: House Home Access: Stairs to enter Secretary/administrator of Steps: 2 Entrance Stairs-Rails: None Home Layout: One level     Bathroom Shower/Tub: Chief Strategy Officer: Standard Bathroom Accessibility: No   Home Equipment: None   Additional Comments: could stay with his niece at d/c      Prior Functioning/Environment Prior Level of Function : Independent/Modified Independent;Driving;Working/employed             Mobility Comments: Ind no issues PTA ADLs Comments: Ind, driving, reports he does not work (conflicting with PT eval) though reports he goes to the gym    OT Problem List: Decreased strength;Decreased range of motion;Decreased activity tolerance;Decreased cognition;Impaired balance (sitting and/or standing);Decreased knowledge of precautions;Decreased safety awareness   OT Treatment/Interventions: Self-care/ADL training;Therapeutic exercise;DME and/or AE instruction;Energy conservation;Therapeutic activities;Cognitive remediation/compensation;Visual/perceptual remediation/compensation;Patient/family education;Balance training      OT Goals(Current goals can be found in the care plan section)   Acute  Rehab OT Goals OT Goal Formulation: With patient Time For Goal Achievement: 03/31/24 Potential to Achieve Goals: Good   OT Frequency:  Min 2X/week    Co-evaluation              AM-PAC OT 6 Clicks Daily Activity     Outcome Measure Help from another person eating meals?: A Little Help from another person taking care of personal grooming?: A Little Help from another person toileting, which includes using toliet, bedpan, or urinal?: A Little Help from another person bathing (including washing, rinsing, drying)?: A Little Help from another person to put on and taking off regular upper body clothing?: A Little Help from another person to put on and taking off regular lower body clothing?: A Little 6 Click Score: 18   End of Session Equipment Utilized During Treatment: Gait belt Nurse Communication: Mobility status  Activity Tolerance: Patient tolerated treatment well Patient left: in chair;with call bell/phone within reach  OT Visit Diagnosis: Unsteadiness on feet (R26.81);Other abnormalities of gait and mobility (R26.89);Muscle weakness (generalized) (M62.81);Other symptoms and signs involving cognitive function                Time: 8745-8675 OT Time Calculation (min): 30 min Charges:  OT General Charges $OT Visit: 1 Visit OT Evaluation $OT Eval Moderate Complexity: 1 Mod OT Treatments $Self Care/Home Management : 8-22 mins  Zenith Lamphier K, OTD, OTR/L SecureChat Preferred Acute Rehab [phone]   Laneta POUR Koonce 03/17/2024, 4:06  PM

## 2024-03-17 NOTE — Progress Notes (Signed)
 Advanced Heart Failure Rounding Note   Subjective:    8/15 admitted with OOH VF arrest in setting of acute inferolateral STEMI -> s/p PCI/DES RPL branch 8/16 extubated  No acute events overnight, brisk 9 laps around the floor this morning, no complaints.  Foley in place, records difficult to interpret but had foley after cyroablation of prostate 10 days prior, looks like it was removed on 8/11 and replaced on arrival. Given bloody output will continue for now, will need urology follow up.   Some continued confusion surrounding the event. Discussed presentation again.    Objective:   Weight Range:  Vital Signs:   Temp:  [98.5 F (36.9 C)-99.9 F (37.7 C)] 99.1 F (37.3 C) (08/17 1200) Pulse Rate:  [64-86] 71 (08/17 1200) Resp:  [8-26] 26 (08/17 1200) BP: (106-159)/(48-101) 120/90 (08/17 1200) SpO2:  [94 %-100 %] 95 % (08/17 1200) Last BM Date : 03/17/24   Intake/Output:   Intake/Output Summary (Last 24 hours) at 03/17/2024 1405 Last data filed at 03/17/2024 1011 Gross per 24 hour  Intake 3 ml  Output 815 ml  Net -812 ml     Physical Exam: GENERAL: NAD, well appearing PULM:  Normal work of breathing, CTAB CARDIAC:  JVP: flat         Normal rate with regular rhythm. No murmurs, rubs or gallops.  No edema. Warm and well perfused extremities. ABDOMEN: Soft, non-tender, non-distended. NEUROLOGIC: Patient is oriented x3 with no focal or lateralizing neurologic deficits.    Telemetry: Sinus 70s Personally reviewed  Labs: Basic Metabolic Panel: Recent Labs  Lab 03/15/24 1139 03/15/24 1221 03/15/24 1402 03/15/24 2114 03/16/24 0414 03/16/24 1020 03/17/24 0423  NA 135 136  --  135 132* 136 138  K 3.9 2.4*  --  2.9* 3.5 3.6 3.6  CL 100  --   --  97* 99 102 106  CO2 21*  --   --  23 24 24 24   GLUCOSE 212*  --   --  100* 121* 114* 96  BUN 15  --   --  12 10 9 9   CREATININE 1.04  --   --  0.88 0.79 0.80 0.87  CALCIUM  8.6*  --   --  8.7* 8.4* 8.7* 8.8*  MG  --    --  1.9 2.1 2.0  --  1.9    Liver Function Tests: Recent Labs  Lab 03/15/24 1139 03/16/24 0414  AST 132* 158*  ALT 111* 91*  ALKPHOS 86 70  BILITOT 1.1 0.8  PROT 7.0 6.3*  ALBUMIN 3.5 3.1*   No results for input(s): LIPASE, AMYLASE in the last 168 hours. No results for input(s): AMMONIA in the last 168 hours.  CBC: Recent Labs  Lab 03/15/24 1139 03/15/24 1221 03/16/24 0414 03/17/24 0423  WBC 17.0*  --  10.2 7.8  NEUTROABS 12.0*  --   --   --   HGB 13.4 13.3 12.7* 12.3*  HCT 41.9 39.0 39.9 37.7*  MCV 83.8  --  84.5 82.3  PLT 213  --  191 177    Cardiac Enzymes: No results for input(s): CKTOTAL, CKMB, CKMBINDEX, TROPONINI in the last 168 hours.  BNP: BNP (last 3 results) No results for input(s): BNP in the last 8760 hours.  ProBNP (last 3 results) No results for input(s): PROBNP in the last 8760 hours.   Medications:     Scheduled Medications:  aspirin   81 mg Oral Daily   atorvastatin   80 mg Oral Daily  Chlorhexidine  Gluconate Cloth  6 each Topical Daily   clopidogrel   75 mg Oral Daily   insulin  aspart  0-15 Units Subcutaneous Q4H   lidocaine   2 patch Transdermal Q24H   losartan   25 mg Oral Daily   magnesium  oxide  400 mg Oral Daily   metoprolol  succinate  25 mg Oral Daily   potassium chloride   40 mEq Oral Daily   sodium chloride  flush  3 mL Intravenous Q12H   spironolactone   25 mg Oral Daily    Infusions:    PRN Medications: acetaminophen , ondansetron  (ZOFRAN ) IV, mouth rinse, sodium chloride  flush   Assessment/Plan:   1. OOH VF Cardiac Arrest  - Witnessed, CPR initiated ~1 min after found down, total ~15 min CPR, 6 defibs, epi and amio  - posturing on arrival - Now extubated and besides some early amnesia no other defects - post arrest EKG w/ anterolateral STE. Emergent cath w/ right LPB occlusion (culprit). S/p PCI  - Echo 40%  2. CAD with inferolateral STEMI/CAD - Culprit on cath was occlusion of large RPLB, s/p  PCI. No other disease   - Continue DAPT with ASA-Plavix  (Brilinta  changed to Plavix  due to interaction with prostate meds)  3. Ischemic CM - Echo EF 40% - Continue low dose losartan , metoprolol , and spironolactone    4. Hypoxic Respiratory Failure - intubated  - extubated 8/16   5. Prostate Cancer  - s/p recent prostate cryoablation on 03/06/24 - Continue foley until urology follow up  Can got to the floor, anticipate discharge home tomorrow   Length of Stay: 2   Morene JINNY Brownie MD 03/17/2024, 2:05 PM  Advanced Heart Failure Team Pager [phone] (M-F; 7a - 4p)  Please contact [facility] for night-coverage after hours (4p -7a ) and weekends on [email or web address]

## 2024-03-18 ENCOUNTER — Other Ambulatory Visit: Payer: Self-pay

## 2024-03-18 ENCOUNTER — Other Ambulatory Visit (HOSPITAL_COMMUNITY): Payer: Self-pay

## 2024-03-18 DIAGNOSIS — I213 ST elevation (STEMI) myocardial infarction of unspecified site: Secondary | ICD-10-CM | POA: Diagnosis not present

## 2024-03-18 DIAGNOSIS — I2510 Atherosclerotic heart disease of native coronary artery without angina pectoris: Secondary | ICD-10-CM | POA: Diagnosis not present

## 2024-03-18 DIAGNOSIS — Z9889 Other specified postprocedural states: Secondary | ICD-10-CM

## 2024-03-18 DIAGNOSIS — E876 Hypokalemia: Secondary | ICD-10-CM | POA: Insufficient documentation

## 2024-03-18 DIAGNOSIS — R310 Gross hematuria: Secondary | ICD-10-CM

## 2024-03-18 DIAGNOSIS — I255 Ischemic cardiomyopathy: Secondary | ICD-10-CM | POA: Diagnosis not present

## 2024-03-18 DIAGNOSIS — I469 Cardiac arrest, cause unspecified: Secondary | ICD-10-CM | POA: Diagnosis not present

## 2024-03-18 LAB — BASIC METABOLIC PANEL WITH GFR
Anion gap: 6 (ref 5–15)
BUN: 16 mg/dL (ref 8–23)
CO2: 23 mmol/L (ref 22–32)
Calcium: 8.7 mg/dL — ABNORMAL LOW (ref 8.9–10.3)
Chloride: 109 mmol/L (ref 98–111)
Creatinine, Ser: 0.81 mg/dL (ref 0.61–1.24)
GFR, Estimated: >60 mL/min (ref >60)
Glucose, Bld: 92 mg/dL (ref 70–99)
Potassium: 3.9 mmol/L (ref 3.5–5.1)
Sodium: 138 mmol/L (ref 135–145)

## 2024-03-18 LAB — CBC
HCT: 36.1 % — ABNORMAL LOW (ref 39.0–52.0)
Hemoglobin: 11.7 g/dL — ABNORMAL LOW (ref 13.0–17.0)
MCH: 26.8 pg (ref 26.0–34.0)
MCHC: 32.4 g/dL (ref 30.0–36.0)
MCV: 82.8 fL (ref 80.0–100.0)
Platelets: 174 K/uL (ref 150–400)
RBC: 4.36 MIL/uL (ref 4.22–5.81)
RDW: 14 % (ref 11.5–15.5)
WBC: 7.9 K/uL (ref 4.0–10.5)
nRBC: 0 % (ref 0.0–0.2)

## 2024-03-18 LAB — GLUCOSE, CAPILLARY
Glucose-Capillary: 103 mg/dL — ABNORMAL HIGH (ref 70–99)
Glucose-Capillary: 111 mg/dL — ABNORMAL HIGH (ref 70–99)
Glucose-Capillary: 104 mg/dL — ABNORMAL HIGH (ref 70–99)
Glucose-Capillary: 95 mg/dL (ref 70–99)
Glucose-Capillary: 103 mg/dL — ABNORMAL HIGH (ref 70–99)

## 2024-03-18 LAB — MAGNESIUM: Magnesium: 1.7 mg/dL (ref 1.7–2.4)

## 2024-03-18 LAB — LIPOPROTEIN A (LPA): Lipoprotein (a): 185 nmol/L — ABNORMAL HIGH (ref <75.0)

## 2024-03-18 MED ORDER — LOSARTAN POTASSIUM 50 MG PO TABS
50.0000 mg | ORAL_TABLET | Freq: Every day | ORAL | Status: DC
  Administered 2024-03-19: 50 mg via ORAL
  Filled 2024-03-18: qty 1

## 2024-03-18 MED ORDER — CLOPIDOGREL BISULFATE 75 MG PO TABS
75.0000 mg | ORAL_TABLET | Freq: Every day | ORAL | 2 refills | Status: DC
  Filled 2024-03-18: qty 90, 90d supply, fill #0

## 2024-03-18 MED ORDER — METOPROLOL SUCCINATE ER 25 MG PO TB24
25.0000 mg | ORAL_TABLET | Freq: Every day | ORAL | 2 refills | Status: DC
  Filled 2024-03-18: qty 90, 90d supply, fill #0

## 2024-03-18 MED ORDER — LOSARTAN POTASSIUM 25 MG PO TABS
25.0000 mg | ORAL_TABLET | Freq: Every day | ORAL | 2 refills | Status: DC
  Filled 2024-03-18: qty 90, 90d supply, fill #0

## 2024-03-18 MED ORDER — MAGNESIUM OXIDE -MG SUPPLEMENT 400 (240 MG) MG PO TABS
400.0000 mg | ORAL_TABLET | Freq: Every day | ORAL | 0 refills | Status: AC
  Filled 2024-03-18: qty 10, 10d supply, fill #0

## 2024-03-18 MED ORDER — ASPIRIN 81 MG PO CHEW
81.0000 mg | CHEWABLE_TABLET | Freq: Every day | ORAL | 2 refills | Status: DC
  Filled 2024-03-18: qty 90, 90d supply, fill #0

## 2024-03-18 MED ORDER — POTASSIUM CHLORIDE CRYS ER 20 MEQ PO TBCR
40.0000 meq | EXTENDED_RELEASE_TABLET | Freq: Every day | ORAL | 0 refills | Status: DC
  Filled 2024-03-18: qty 20, 10d supply, fill #0

## 2024-03-18 MED ORDER — SPIRONOLACTONE 25 MG PO TABS
25.0000 mg | ORAL_TABLET | Freq: Every day | ORAL | 2 refills | Status: DC
  Filled 2024-03-18: qty 90, 90d supply, fill #0

## 2024-03-18 MED ORDER — ATORVASTATIN CALCIUM 80 MG PO TABS
80.0000 mg | ORAL_TABLET | Freq: Every day | ORAL | 2 refills | Status: DC
  Filled 2024-03-18: qty 90, 90d supply, fill #0

## 2024-03-18 NOTE — Progress Notes (Signed)
 Physical Therapy Treatment Patient Details Name: Jeffery Graves MRN: [id number] DOB: 1955/08/20 Today's Date: 03/18/2024   History of Present Illness 68 y.o. M admitted 8/15 for STEMI, 15 minute Vfib arrest. S/p Cardiac stent distal RCA. ETT 8/15-8/16. PMH significant for HTN and recurrent prostate Ca.    PT Comments  Pt received in supine, initially reluctant to participate in OOB mobility but pt receptive to instruction on IS use, supine/standing exercises per handout (via visual/verbal demo, pt defers to attempt). After initial instruction, pt requesting to walk to toilet, then agreeable to gait trial in hallway. Pt Supervision level for safety only, but defers return to chair/bed at end of session, pt standing in hallway speaking with NT, RN also notified. SpO2/HR WFL on RA with supine/standing tasks. Pt states I'm leaving tomorrow but has decreased recall of recent sequence of events and apparently traumatically removed his foley earlier in the day, pt with hematuria when using urinal, RN notified/aware. Pt continues to benefit from PT services to progress toward functional mobility goals, also recommend pt continue working with mobility and nursing staff to encourage frequent OOB mobility.    If plan is discharge home, recommend the following: Supervision due to cognitive status;Help with stairs or ramp for entrance;Assist for transportation;Direct supervision/assist for medications management;Direct supervision/assist for financial management;Assistance with cooking/housework   Can travel by private vehicle        Equipment Recommendations  None recommended by PT    Recommendations for Other Services       Precautions / Restrictions Precautions Precautions: Fall Recall of Precautions/Restrictions: Impaired Precaution/Restrictions Comments: hematuria Restrictions Weight Bearing Restrictions Per Provider Order: No     Mobility  Bed Mobility Overal bed mobility: Modified  Independent Bed Mobility: Supine to Sit     Supine to sit: Modified independent (Device/Increase time)     General bed mobility comments: no assist needed; pt using bed features to pull up on    Transfers Overall transfer level: Needs assistance Equipment used: None Transfers: Sit to/from Stand Sit to Stand: Modified independent (Device/Increase time)           General transfer comment: from EOB, no lift or steadying assist needed    Ambulation/Gait Ambulation/Gait assistance: Supervision Gait Distance (Feet): 500 Feet Assistive device: None Gait Pattern/deviations: Step-through pattern, Wide base of support   Gait velocity interpretation: >2.62 ft/sec, indicative of community ambulatory (when encouraged to walk fast)   General Gait Details: Initial mild drift to his L side, but after this pt WFL no LOB, able to perform head turns/speed changes. SpO2 poor signal on portable pulse ox, recheck in room and reading 93-95% on RA while standing, HR to 102 bpm with standing/gait   Stairs Stairs:  (pt defers)           Wheelchair Mobility     Tilt Bed    Modified Rankin (Stroke Patients Only)       Balance Overall balance assessment: Mild deficits observed, not formally tested                                          Communication Communication Communication: No apparent difficulties  Cognition Arousal: Alert Behavior During Therapy: WFL for tasks assessed/performed   PT - Cognitive impairments: Orientation, Memory                       PT -  Cognition Comments: Some decreased awareness of sequence of events prior to session. Pt often changing his mind about what he wants, perseverating initially on wanting to chill and then when PTA leaving room, requesting to get up to bathroom and as PTA leaving again, requesting to participate in hallway ambulation. RN aware and reports his brother states he has some confusion at  baseline. Following commands: Intact      Cueing Cueing Techniques: Verbal cues  Exercises Other Exercises Other Exercises: discussion on supine/standing HEP (handout provided) as long as pt has bed rail to hold on to while performing, pt defers to perform during session Other Exercises: IS x 10 reps pt achieves 200-1,044mL, variable, does better the more he attempts, encouraged hourly use    General Comments General comments (skin integrity, edema, etc.): Pt urinating into urinal with reddish appearing urine, RN/MD aware. Pt denies pain but appears defensive. HR 60's bpm resting and to ~100 bpm with exertion.      Pertinent Vitals/Pain Pain Assessment Pain Assessment: Faces Faces Pain Scale: No hurt Pain Intervention(s): Monitored during session    Home Living                          Prior Function            PT Goals (current goals can now be found in the care plan section) Acute Rehab PT Goals Patient Stated Goal: go home soon PT Goal Formulation: With patient Time For Goal Achievement: 03/30/24 Progress towards PT goals: Progressing toward goals    Frequency    Min 2X/week      PT Plan      Co-evaluation              AM-PAC PT 6 Clicks Mobility   Outcome Measure  Help needed turning from your back to your side while in a flat bed without using bedrails?: None Help needed moving from lying on your back to sitting on the side of a flat bed without using bedrails?: None Help needed moving to and from a bed to a chair (including a wheelchair)?: None Help needed standing up from a chair using your arms (e.g., wheelchair or bedside chair)?: None Help needed to walk in hospital room?: A Little Help needed climbing 3-5 steps with a railing? : A Little 6 Click Score: 22    End of Session Equipment Utilized During Treatment:  (pt defers gait belt) Activity Tolerance: Patient tolerated treatment well Patient left: Other (comment) (pt standing in  hallway talking with NT; he defers return to bed/chair and room) Nurse Communication: Mobility status;Other (comment) (pt wanting to walk more, RN/NT aware, pt speaking with NT in hallway) PT Visit Diagnosis: Unsteadiness on feet (R26.81);Other abnormalities of gait and mobility (R26.89);Pain     Time: 1755-1818 PT Time Calculation (min) (ACUTE ONLY): 23 min  Charges:    $Gait Training: 8-22 mins $Therapeutic Exercise: 8-22 mins PT General Charges $$ ACUTE PT VISIT: 1 Visit                     Garvin Ellena P., PTA Acute Rehabilitation Services Secure Chat Preferred 9a-5:30pm Office: [phone]    Connell HERO [facility] 03/18/2024, 6:39 PM

## 2024-03-18 NOTE — Progress Notes (Addendum)
 Advanced Heart Failure Rounding Note   Subjective:    8/15 admitted with OOH VF arrest in setting of acute inferolateral STEMI -> s/p PCI/DES RPL branch 8/16 extubated  Foley in place, records difficult to interpret but had foley after cyroablation of prostate 10 days prior, looks like it was removed on 8/11 and replaced on arrival.   Wants to go home. Says he wants to follow up with Dr Patrcia about his foley. Suspect he had foley trauma earlier this morning. Denies chest pain   Objective:   Weight Range:  Vital Signs:   Temp:  [97.6 F (36.4 C)-99.7 F (37.6 C)] 98 F (36.7 C) (08/18 0729) Pulse Rate:  [66-78] 68 (08/18 0729) Resp:  [14-26] 18 (08/18 0729) BP: (120-156)/(56-108) 142/86 (08/18 0729) SpO2:  [93 %-99 %] 98 % (08/18 0729) Weight:  [77.7 kg] 77.7 kg (08/17 1652) Last BM Date : 03/17/24   Intake/Output:   Intake/Output Summary (Last 24 hours) at 03/18/2024 0907 Last data filed at 03/18/2024 0731 Gross per 24 hour  Intake 480 ml  Output 765 ml  Net -285 ml     Physical Exam: General:   No resp difficulty. Standing in the room.  Neck: no JVD.  Cor: Regular rate & rhythm.  Lungs: clear on room air.  Abdomen: soft, nontender, nondistended.  Extremities: no  edema Neuro: alert & oriented x3 GU: foley hematuria   Telemetry: SR 70s   Labs: Basic Metabolic Panel: Recent Labs  Lab 03/15/24 1402 03/15/24 2114 03/16/24 0414 03/16/24 1020 03/17/24 0423 03/18/24 0255  NA  --  135 132* 136 138 138  K  --  2.9* 3.5 3.6 3.6 3.9  CL  --  97* 99 102 106 109  CO2  --  23 24 24 24 23   GLUCOSE  --  100* 121* 114* 96 92  BUN  --  12 10 9 9 16   CREATININE  --  0.88 0.79 0.80 0.87 0.81  CALCIUM   --  8.7* 8.4* 8.7* 8.8* 8.7*  MG 1.9 2.1 2.0  --  1.9 1.7    Liver Function Tests: Recent Labs  Lab 03/15/24 1139 03/16/24 0414  AST 132* 158*  ALT 111* 91*  ALKPHOS 86 70  BILITOT 1.1 0.8  PROT 7.0 6.3*  ALBUMIN 3.5 3.1*   No results for input(s):  LIPASE, AMYLASE in the last 168 hours. No results for input(s): AMMONIA in the last 168 hours.  CBC: Recent Labs  Lab 03/15/24 1139 03/15/24 1221 03/16/24 0414 03/17/24 0423 03/18/24 0255  WBC 17.0*  --  10.2 7.8 7.9  NEUTROABS 12.0*  --   --   --   --   HGB 13.4 13.3 12.7* 12.3* 11.7*  HCT 41.9 39.0 39.9 37.7* 36.1*  MCV 83.8  --  84.5 82.3 82.8  PLT 213  --  191 177 174    Cardiac Enzymes: No results for input(s): CKTOTAL, CKMB, CKMBINDEX, TROPONINI in the last 168 hours.  BNP: BNP (last 3 results) No results for input(s): BNP in the last 8760 hours.  ProBNP (last 3 results) No results for input(s): PROBNP in the last 8760 hours.   Medications:     Scheduled Medications:  aspirin   81 mg Oral Daily   atorvastatin   80 mg Oral Daily   Chlorhexidine  Gluconate Cloth  6 each Topical Daily   clopidogrel   75 mg Oral Daily   insulin  aspart  0-15 Units Subcutaneous Q4H   lidocaine   2 patch Transdermal  Q24H   losartan   25 mg Oral Daily   magnesium  oxide  400 mg Oral Daily   metoprolol  succinate  25 mg Oral Daily   potassium chloride   40 mEq Oral Daily   sodium chloride  flush  3 mL Intravenous Q12H   spironolactone   25 mg Oral Daily    Infusions:    PRN Medications: acetaminophen , ondansetron  (ZOFRAN ) IV, mouth rinse, sodium chloride  flush   Assessment/Plan:   1. OOH VF Cardiac Arrest  - Witnessed, CPR initiated ~1 min after found down, total ~15 min CPR, 6 defibs, epi and amio  - posturing on arrival -  post arrest EKG w/ anterolateral STE. Emergent cath w/ right LPB occlusion (culprit). S/p PCI  - Echo 40%  2. CAD with inferolateral STEMI/CAD - Culprit on cath was occlusion of large RPLB, s/p PCI. No other disease   - Continue DAPT with ASA-Plavix  (Brilinta  changed to Plavix  due to interaction with prostate meds) - No chest pain.   3. Acute HFrEF, ICM - Echo EF 40% - Appears euvolemic  - Increase losartan  to 50 mg daily.   - Continue  Toprol  XL 25 mg daily  - Continue spironolactone  25 mg daily  - Hold off on SGLT2i with foley.    4. Hypoxic Respiratory Failure - intubated on admit. SABRA Resolved.    5. Prostate Cancer  - s/p recent prostate cryoablation on 03/06/24 - Continue foley until urology follow up - Having ongoing hematuria but had some foley trauma overnight. See Nurses note.    HF Follow up has been set up.   Length of Stay: 3   Amy Clegg NP-C  03/18/2024, 9:07 AM  Advanced Heart Failure Team Pager [phone] (M-F; 7a - 4p)  Please contact [facility] for night-coverage after hours (4p -7a ) and weekends on [email or web address]  Patient seen and examined with the above-signed Advanced Practice Provider and/or Housestaff. I personally reviewed laboratory data, imaging studies and relevant notes. I independently examined the patient and formulated the important aspects of the plan. I have edited the note to reflect any of my changes or salient points. I have personally discussed the plan with the patient and/or family.  Denies CP or SOB. Adamant that he wants to go home. Vitals stable.  General:  Sitting up No resp difficulty HEENT: normal Neck: supple. no JVD. Carotids 2+ bilat; no bruits. No lymphadenopathy or thryomegaly appreciated. Cor:  Regular rate & rhythm. No rubs, gallops or murmurs. Lungs: clear Abdomen: soft, nontender, nondistended. No hepatosplenomegaly. No bruits or masses. Good bowel sounds. Extremities: no cyanosis, clubbing, rash, edema + Foley with hematuria Neuro: alert & orientedx3, cranial nerves grossly intact. moves all 4 extremities w/o difficulty.   Ok for d/c today from cardiac perspective. Stressed the need to be compliant with DAPT  Eviana Sibilia, MD  11:25 AM

## 2024-03-18 NOTE — TOC CM/SW Note (Signed)
 Transition of Care [facility]) - Inpatient Brief Assessment   Patient Details  Name: Jeffery Graves MRN: [id number] Date of Birth: 09/18/1955  Transition of Care [facility]) CM/SW Contact:    Lauraine FORBES Saa, LCSWA Phone Number: 03/18/2024, 10:17 AM   Clinical Narrative:  10:17 AM Per chart review, patient resides at home alone. Patient has a PCP and insurance. Patient does not have SNF/HH/DME history. Patient's preferred pharmacy's are [facility], [facility], and [facility] [id number]. No TOC needs were identified at this time. TOC will continue to follow and be available to assist.  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

## 2024-03-18 NOTE — Progress Notes (Signed)
 Progress Note   Patient: Jeffery Graves FMW:[id number] DOB: Jan 15, 1956 DOA: 03/15/2024     3 DOS: the patient was seen and examined on 03/18/2024   Brief hospital course: Jakell Trusty is a 68 y.o. M with PMH significant for HTN and recurrent prostate Ca with obstructive voiding, last seen in urology clinic 03/06/24 and plan to proceed with salvage cryo for locally recurrent prostate cancer who was in a grocery store when he had a collapse with possible seizure like activity.  EMS happened to be in the store and started CPR in about one minute, found to be in a shockable rhythm and received about 4-6 defibrillations from the AED and approximately 15 mins CPR.  His initial EKG showed inferolateral STEMI.  Pt had ROSC at the time of ED arrival and was groaning but without purposeful movement.    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 Patient transferred to TRH service 03/17/24 for further management. He is continued on DAPT. Urology consulted for hematuria, foley removed advised to observe for another day as he is on plavix .  Assessment and Plan: S/p Cardiac arrest-outside of hospital. S/p CPR, ACLS protocol. S/p emergent cath PCI to r PLA. Cardiology follow up appreciated. Continue DAPT (aspirin +Plavix ), statin.  Systolic CHF. Ischemic cardiomyopathy. EF 40%. Continue metoprolol , Losartan , aldactone  per cardiology recommendations.  Hypoxic Respiratory Failure S/p cardiac arrest. S/p extubated 8/16, continue to monitor saturation closely.  Hypertension- Continue GDMT. BP stable.   Prostate Cancer Hematuria  s/p recent prostate cryoablation. Foley catheter removed, he has hematuria. Continue to follow h/h, hematuria for another day. He may need CBI. Follow urology recommendations.     Out of bed to chair. Incentive spirometry. Nursing supportive care. Fall, aspiration  precautions. Diet:  Diet Orders (From admission, onward)     Start     Ordered   03/16/24 1446  Diet heart healthy/carb modified Room service appropriate? Yes with Assist; Fluid consistency: Thin  Diet effective now       Question Answer Comment  Diet-HS Snack? Nothing   Room service appropriate? Yes with Assist   Fluid consistency: Thin      03/16/24 1445           DVT prophylaxis: SCD's Start: 03/15/24 1323  Level of care: Telemetry Cardiac   Code Status: Full Code  Subjective: Patient is seen and examined today morning. Foley removed, hematuria noted. He is adamant to go home. Seems restless, impulsive. Explained the need for hospital stay due to hematuria. Also discussed with brother who convinced him to stay. Denies chest pain.   Physical Exam: Vitals:   03/17/24 2336 03/18/24 0314 03/18/24 0729 03/18/24 1212  BP: 135/84 (!) 156/87 (!) 142/86 (!) 140/74  Pulse: 78 73 68 79  Resp: 14 18 18 18   Temp: 97.6 F (36.4 C) 97.9 F (36.6 C) 98 F (36.7 C) 98.4 F (36.9 C)  TempSrc: Oral Oral Oral Oral  SpO2: 93% 99% 98%   Weight:      Height:        General - Elderly African American male, no apparent distress HEENT - PERRLA, EOMI, atraumatic head, non tender sinuses. Lung - Clear, basal rales, rhonchi, no wheezes. Heart - S1, S2 heard, no murmurs, rubs, trace pedal edema. Abdomen - Soft, non tender, bowel sounds good, foley removed. Neuro - Alert, awake and oriented x 3, non focal exam. Skin -  Warm and dry.  Data Reviewed:      Latest Ref Rng & Units 03/18/2024    2:55 AM 03/17/2024    4:23 AM 03/16/2024    4:14 AM  CBC  WBC 4.0 - 10.5 K/uL 7.9  7.8  10.2   Hemoglobin 13.0 - 17.0 g/dL 88.2  87.6  87.2   Hematocrit 39.0 - 52.0 % 36.1  37.7  39.9   Platelets 150 - 400 K/uL 174  177  191       Latest Ref Rng & Units 03/18/2024    2:55 AM 03/17/2024    4:23 AM 03/16/2024   10:20 AM  BMP  Glucose 70 - 99 mg/dL 92  96  885   BUN 8 - 23 mg/dL 16  9  9     Creatinine 0.61 - 1.24 mg/dL 9.18  9.12  9.19   Sodium 135 - 145 mmol/L 138  138  136   Potassium 3.5 - 5.1 mmol/L 3.9  3.6  3.6   Chloride 98 - 111 mmol/L 109  106  102   CO2 22 - 32 mmol/L 23  24  24    Calcium  8.9 - 10.3 mg/dL 8.7  8.8  8.7    No results found.   Family Communication: Discussed with patient, brother at bedside. They understand and agree. All questions answered.  Disposition: Status is: Inpatient Monitoring Hematuria, may need CBI.  Planned Discharge Destination: Home     Time spent: 47 minutes  Author: Concepcion Riser, MD 03/18/2024 2:53 PM Secure chat 7am to 7pm For on call review [email or web address].

## 2024-03-18 NOTE — Progress Notes (Signed)
 Patient c/o needing to void, hematuria noted, blood at insertion site.  Patient keeps going to bathroom and trying to pee.  Patient keeps pulling at penis and catheter.  Unable to educate patient and redirect.  Patient argumentative.  Bladder scanned patient,  no urine noted.   Flushed foley with 30cc NS.  10 cc returned immediately, amber in color.  Patient wants to pull his catheter out.  Educated patient that is contraindicated and that his doctor would be in this morning to speak with him.  Patient anxioux and agitated.

## 2024-03-18 NOTE — Progress Notes (Signed)
 Patient found walking in hall, gait steady, patient noted not to have foley bag.  Asked patient where is foley is, patient states it is still in but he took the bag off.  Redirected patient back to his room, assessed catheter.  Foley remains in place with blood around meatus, drainage bag in chair.  Cleaned both catheter and tube and reconnected drainage bag to foley.  Educated patient about disconnecting foley and risk of infection.  Again discussed with patient the need to discuss his foley with the urologist.  Patient agreeable but anxious.

## 2024-03-18 NOTE — Progress Notes (Signed)
 Pt agitated and pulling up his pants upon my arrival, pt calmed down and seated to educate. Reviewed admission hx with pt and educated him on the following:  Pt was educated on STEMI, stent card, stent location, Antiplatelet and ASA use, wt restrictions, no baths/daily wash-ups, s/s of infection, ex guidelines, s/s to stop exercising, NTG use and calling 911, heart healthy diet, risk factors and CRPII. Pt received MI book and materials on exercise, diet, and CRPII. Will refer to [facility].   Amb deferred d/t agitation and walking with PT w/o chest pain. Pt states he is interested in CR  Garen FORBES Candy MS, ACSM-CEP  03/18/2024 9:28 AM   [phone]

## 2024-03-18 NOTE — Consult Note (Addendum)
 Urology Consult Note   Requesting Attending Physician:  Darci Pore, MD Service Providing Consult: Urology  Consulting Attending: Dr. Sherrilee   Reason for Consult:  voiding trial  HPI: Jeffery Graves is seen in consultation for reasons noted above at the request of Darci Pore, MD. patient is a 68 year old male known to our practice and followed by Dr. Alvaro.  He recently underwent salvage cryoablation for recurrence of prostate cancer on 03/06/2024.  He was seen in clinic and successfully passed his voiding trial on 03/11/2024.  Patient reportedly had an acute anterolateral STEMI in [employer] on 03/15/2024 requiring 15 minutes of CPR.  Patient was sent urgently to Cath Lab showing a distal occlusion of the right PLB.  S/p PCI/DES. [facility] was consulted because patient want his catheter out.  On my arrival he was up walking in the room with his Foley catheter full of very frank bloody urine.  He was a poor historian and did not recall his voiding trial. ------------------  Assessment:   68 y.o. male s/p STEMI and DES placement this admission, salvage cryoablation last week, successful TOV,  now with hematuria following traumatic catheter placement.   Recommendations: #hematuria #voiding trial  Unfortunately patient requires Plavix  and will for some time.  He has been losing around half an point of hemoglobin a day.  Continue to trend labs.  Will place hematuria catheter and start CBI if patient becomes clot obstructed or bleeding acutely worsens.  Foley catheter was placed traumatically at some point during this hospitalization for unknown reasons.  He should not ultimately need this because he was voiding well postoperatively.  Reviewed case and plan with his surgeon and the patient.  Shared decision was made to proceed with voiding trial on the chance that the catheter in the context of the Plavix  was causing the hematuria.  Recommend staying in hospital  overnight and reevaluate in the morning.  Collect string of bottles  Case and plan discussed with Dr. Alvaro  Past Medical History: Past Medical History:  Diagnosis Date   BPH (benign prostatic hyperplasia)    Hx of radiation therapy 09/06/10 - 10/29/10   prostate, seminal vesicles   Hypertension    on meds   Prostate cancer (HCC) 04/26/2010   gleason 7, vol 20.88 cc    Past Surgical History:  Past Surgical History:  Procedure Laterality Date   BIOPSY PROSTATE     CORONARY/GRAFT ACUTE MI REVASCULARIZATION N/A 03/15/2024   Procedure: Coronary/Graft Acute MI Revascularization;  Surgeon: Elmira Newman PARAS, MD;  [facility]: [facility];  Service: Cardiovascular;  Laterality: N/A;   CRYOABLATION N/A 03/06/2024   Procedure: CRYOABLATION, PROSTATE, CYSTOSCOPY;  Surgeon: Alvaro Ricardo KATHEE Mickey., MD;  Location: [facility];  Service: Urology;  Laterality: N/A;    Medication: Current Facility-Administered Medications  Medication Dose Route Frequency Provider Last Rate Last Admin   acetaminophen  (TYLENOL ) tablet 650 mg  650 mg Oral Q4H PRN Bensimhon, Toribio SAUNDERS, MD   650 mg at 03/17/24 2105   aspirin  chewable tablet 81 mg  81 mg Oral Daily Bensimhon, Daniel R, MD   81 mg at 03/18/24 9074   atorvastatin  (LIPITOR ) tablet 80 mg  80 mg Oral Daily Bensimhon, Daniel R, MD   80 mg at 03/18/24 9074   Chlorhexidine  Gluconate Cloth 2 % PADS 6 each  6 each Topical Daily Bensimhon, Daniel R, MD   6 each at 03/18/24 9072   clopidogrel  (PLAVIX ) tablet 75 mg  75 mg Oral Daily  Bensimhon, Daniel R, MD   75 mg at 03/18/24 9074   insulin  aspart (novoLOG ) injection 0-15 Units  0-15 Units Subcutaneous Q4H Bensimhon, Toribio SAUNDERS, MD   2 Units at 03/16/24 0430   lidocaine  (LIDODERM ) 5 % 2 patch  2 patch Transdermal Q24H Bensimhon, Daniel R, MD   2 patch at 03/18/24 0927   [START ON 03/19/2024] losartan  (COZAAR ) tablet 50 mg  50 mg Oral Daily Clegg, Amy D, NP       magnesium  oxide (MAG-OX) tablet 400 mg  400 mg Oral Daily  Sreeram, Narendranath, MD   400 mg at 03/18/24 9074   metoprolol  succinate (TOPROL -XL) 24 hr tablet 25 mg  25 mg Oral Daily Bensimhon, Daniel R, MD   25 mg at 03/18/24 9074   ondansetron  (ZOFRAN ) injection 4 mg  4 mg Intravenous Q6H PRN Bensimhon, Toribio SAUNDERS, MD       Oral care mouth rinse  15 mL Mouth Rinse PRN Darci Pore, MD       potassium chloride  SA (KLOR-CON  M) CR tablet 40 mEq  40 mEq Oral Daily Sreeram, Narendranath, MD   40 mEq at 03/18/24 9074   sodium chloride  flush (NS) 0.9 % injection 3 mL  3 mL Intravenous Q12H Bensimhon, Toribio SAUNDERS, MD   3 mL at 03/17/24 2105   sodium chloride  flush (NS) 0.9 % injection 3 mL  3 mL Intravenous PRN Bensimhon, Toribio SAUNDERS, MD       spironolactone  (ALDACTONE ) tablet 25 mg  25 mg Oral Daily Stoner, Benjamin J, MD   25 mg at 03/18/24 9074    Allergies: No Known Allergies  Social History: Social History   Tobacco Use   Smoking status: Never   Smokeless tobacco: Never  Vaping Use   Vaping status: Never Used  Substance Use Topics   Alcohol use: Never    Alcohol/week: 7.0 standard drinks of alcohol    Types: 7 Standard drinks or equivalent per week   Drug use: No    Family History Family History  Problem Relation Age of Onset   Stroke Mother    Colon cancer Neg Hx    Colon polyps Neg Hx    Esophageal cancer Neg Hx    Stomach cancer Neg Hx    Rectal cancer Neg Hx     Review of Systems  Genitourinary:  Positive for hematuria. Negative for dysuria, flank pain, frequency and urgency.     Objective   Vital signs in last 24 hours: BP (!) 140/74 (BP Location: Left Arm)   Pulse 79   Temp 98.4 F (36.9 C) (Oral)   Resp 18   Ht 5' 6 (1.676 m)   Wt 77.7 kg   SpO2 98%   BMI 27.63 kg/m   Physical Exam General: Seems somewhat confused, questionable mental status HEENT: [place]/AT Pulmonary: Normal work of breathing Cardiovascular: no cyanosis Abdomen: Soft, NTTP, nondistended GU: Foley catheter in place draining frank bloody  urine  Most Recent Labs: Lab Results  Component Value Date   WBC 7.9 03/18/2024   HGB 11.7 (L) 03/18/2024   HCT 36.1 (L) 03/18/2024   PLT 174 03/18/2024    Lab Results  Component Value Date   NA 138 03/18/2024   K 3.9 03/18/2024   CL 109 03/18/2024   CO2 23 03/18/2024   BUN 16 03/18/2024   CREATININE 0.81 03/18/2024   CALCIUM  8.7 (L) 03/18/2024   MG 1.7 03/18/2024    Lab Results  Component Value Date   INR  1.2 03/15/2024   APTT 30 03/15/2024     Urine Culture: @[id number] (laburin,org,[id number],r9621)@   IMAGING: No results found.  ------  Ole Bourdon, NP Pager: [phone]   Please contact the urology consult pager with any further questions/concerns.

## 2024-03-19 ENCOUNTER — Encounter (HOSPITAL_COMMUNITY): Payer: Self-pay | Admitting: Urology

## 2024-03-19 ENCOUNTER — Other Ambulatory Visit (HOSPITAL_COMMUNITY): Payer: Self-pay

## 2024-03-19 DIAGNOSIS — I255 Ischemic cardiomyopathy: Secondary | ICD-10-CM | POA: Diagnosis not present

## 2024-03-19 DIAGNOSIS — E876 Hypokalemia: Secondary | ICD-10-CM

## 2024-03-19 DIAGNOSIS — I213 ST elevation (STEMI) myocardial infarction of unspecified site: Secondary | ICD-10-CM | POA: Diagnosis not present

## 2024-03-19 DIAGNOSIS — C61 Malignant neoplasm of prostate: Secondary | ICD-10-CM

## 2024-03-19 DIAGNOSIS — I469 Cardiac arrest, cause unspecified: Secondary | ICD-10-CM | POA: Diagnosis not present

## 2024-03-19 LAB — BASIC METABOLIC PANEL WITH GFR
Anion gap: 10 (ref 5–15)
BUN: 14 mg/dL (ref 8–23)
CO2: 22 mmol/L (ref 22–32)
Calcium: 9 mg/dL (ref 8.9–10.3)
Chloride: 105 mmol/L (ref 98–111)
Creatinine, Ser: 0.77 mg/dL (ref 0.61–1.24)
GFR, Estimated: >60 mL/min (ref >60)
Glucose, Bld: 100 mg/dL — ABNORMAL HIGH (ref 70–99)
Potassium: 4.1 mmol/L (ref 3.5–5.1)
Sodium: 137 mmol/L (ref 135–145)

## 2024-03-19 LAB — CBC
HCT: 34.2 % — ABNORMAL LOW (ref 39.0–52.0)
Hemoglobin: 11.3 g/dL — ABNORMAL LOW (ref 13.0–17.0)
MCH: 26.9 pg (ref 26.0–34.0)
MCHC: 33 g/dL (ref 30.0–36.0)
MCV: 81.4 fL (ref 80.0–100.0)
Platelets: 189 K/uL (ref 150–400)
RBC: 4.2 MIL/uL — ABNORMAL LOW (ref 4.22–5.81)
RDW: 13.8 % (ref 11.5–15.5)
WBC: 9 K/uL (ref 4.0–10.5)
nRBC: 0 % (ref 0.0–0.2)

## 2024-03-19 LAB — MAGNESIUM: Magnesium: 1.6 mg/dL — ABNORMAL LOW (ref 1.7–2.4)

## 2024-03-19 LAB — GLUCOSE, CAPILLARY: Glucose-Capillary: 100 mg/dL — ABNORMAL HIGH (ref 70–99)

## 2024-03-19 MED ORDER — LOSARTAN POTASSIUM 25 MG PO TABS
50.0000 mg | ORAL_TABLET | Freq: Every day | ORAL | 2 refills | Status: DC
Start: 2024-03-19 — End: 2024-03-25
  Filled 2024-03-19: qty 180, 90d supply, fill #0

## 2024-03-19 MED ORDER — POTASSIUM CHLORIDE CRYS ER 20 MEQ PO TBCR
20.0000 meq | EXTENDED_RELEASE_TABLET | Freq: Every day | ORAL | 0 refills | Status: DC
  Filled 2024-03-19: qty 10, 10d supply, fill #0

## 2024-03-19 NOTE — TOC Initial Note (Signed)
 Transition of Care [facility]) - Initial/Assessment Note    Patient Details  Name: Jeffery Graves MRN: [id number] Date of Birth: March 06, 1956  Transition of Care [facility]) CM/SW Contact:    Justina Delcia Czar, RN Phone Number: [phone] 03/19/2024, 8:57 AM  Clinical Narrative:                 Spoke to pt and brother at bedside. Gave permission to speak to brother, [place]. States brother will provide transportation to home. Pt states he drives to appts or his brother will assist. States he has scale at home for daily weights. Educated on importance of daily weights. Provided pt with Living Better with HF booklet. Discussed low sodium/heart healthy diet. 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     Expected Discharge Plan: Home/Self Care Barriers to Discharge: No Barriers Identified   Patient Goals and CMS Choice Patient states their goals for this hospitalization and ongoing recovery are:: wants to return home          Expected Discharge Plan and Services   Discharge Planning Services: CM Consult   Living arrangements for the past 2 months: Single Family Home                                      Prior Living Arrangements/Services Living arrangements for the past 2 months: Single Family Home Lives with:: Self Patient language and need for interpreter reviewed:: Yes Do you feel safe going back to the place where you live?: Yes      Need for Family Participation in Patient Care: No (Comment) Care giver support system in place?: No (comment) Current home services: DME (scale) Criminal Activity/Legal Involvement Pertinent to Current Situation/Hospitalization: No - Comment as needed  Activities of Daily Living   ADL Screening (condition at time of admission) Independently performs ADLs?: Yes (appropriate for developmental age) Is the patient deaf or have difficulty hearing?: No Does the patient have difficulty seeing, even when wearing  glasses/contacts?: No Does the patient have difficulty concentrating, remembering, or making decisions?: No  Permission Sought/Granted Permission sought to share information with : Case Manager, Family Supports, PCP Permission granted to share information with : Yes, Verbal Permission Granted  Share Information with NAME: Darryon Bastin  Permission granted to share info w AGENCY: PCP, DME  Permission granted to share info w Relationship: brother  Permission granted to share info w Contact Information: [phone]  Emotional Assessment Appearance:: Appears stated age Attitude/Demeanor/Rapport: Engaged Affect (typically observed): Accepting Orientation: : Oriented to Self, Oriented to Place, Oriented to  Time, Oriented to Situation   Psych Involvement: No (comment)  Admission diagnosis:  STEMI (ST elevation myocardial infarction) [facility]) [I21.3] Patient Active Problem List   Diagnosis Date Noted   Ischemic cardiomyopathy 03/18/2024   Hypokalemia 03/18/2024   Cardiac arrest (HCC) 03/16/2024   STEMI (ST elevation myocardial infarction) (HCC) 03/15/2024   Prostate cancer (HCC) 03/06/2024   Vitamin D  deficiency 03/13/2014   BPH (benign prostatic hyperplasia) 10/04/2013   DENTAL CARIES 02/10/2010   ERECTILE DYSFUNCTION, SECONDARY TO MEDICATION 02/26/2009   HYPERTENSION, BENIGN ESSENTIAL 08/02/2007   PCP:  Delbert Clam, MD Pharmacy:   [facility] [address], [address] [place] [address] Phone: [phone] Fax: [phone]  [facility] - [place] - [address] [place] (  [place] [address] Phone: [phone] Fax: [phone]  [facility] [address] [place] [address] Phone: [phone] Fax: [phone]     Social Drivers of Health (SDOH) Social History: SDOH Screenings   Food Insecurity: No Food Insecurity  (03/17/2024)  Housing: Low Risk  (03/17/2024)  Transportation Needs: No Transportation Needs (03/17/2024)  Utilities: Not At Risk (03/17/2024)  Depression (PHQ2-9): Low Risk  (06/22/2021)  Social Connections: Unknown (03/17/2024)  Tobacco Use: Low Risk  (03/06/2024)  Recent Concern: Tobacco Use - Medium Risk (02/12/2024)   SDOH Interventions:     Readmission Risk Interventions     No data to display

## 2024-03-19 NOTE — Progress Notes (Signed)
 Occupational Therapy Treatment Patient Details Name: Jeffery Graves MRN: [id number] DOB: 04/29/1956 Today's Date: 03/19/2024   History of present illness 68 y.o. M admitted 8/15 for STEMI, 15 minute Vfib arrest. S/p Cardiac stent distal RCA. ETT 8/15-8/16. PMH significant for HTN and recurrent prostate Ca.   OT comments  Patient sitting EOB upon arrival into room and willing to participate in OT intervention.  Patient completed sit to stand and functional mobility to sink Independently/Supervision. Patient able to completed UB grooming task of washing face and oral hygiene with distant Supervision.  Patient asking to shave face/hair but unable to locate rechargeable shaver to complete task.  Patient educated on overall safety when he returns home with patient verbalizing understanding.  Patient left sitting EOB with all needs within reach.  Patient is making good progress toward OT goals and would benefit from follow up OP therapy to address remaining deficits.      If plan is discharge home, recommend the following:  A little help with walking and/or transfers;A little help with bathing/dressing/bathroom;Direct supervision/assist for medications management;Direct supervision/assist for financial management;Assist for transportation;Help with stairs or ramp for entrance;Supervision due to cognitive status   Equipment Recommendations  None recommended by OT    Recommendations for Other Services      Precautions / Restrictions Precautions Precautions: Fall Recall of Precautions/Restrictions: Impaired Precaution/Restrictions Comments: hematuria Restrictions Weight Bearing Restrictions Per Provider Order: No       Mobility Bed Mobility Overal bed mobility: Modified Independent (HOB elevated) Bed Mobility: Supine to Sit     Supine to sit: Modified independent (Device/Increase time)          Transfers Overall transfer level: Independent Equipment used: None Transfers: Sit to/from  Stand Sit to Stand: Independent                 Balance                                           ADL either performed or assessed with clinical judgement   ADL Overall ADL's : Modified independent     Grooming: Wash/dry face;Oral care;Supervision/safety (distant supervision)               Lower Body Dressing: Set up               Functional mobility during ADLs: Independent      Extremity/Trunk Assessment Upper Extremity Assessment Upper Extremity Assessment: Overall WFL for tasks assessed            Vision   Vision Assessment?: No apparent visual deficits   Perception     Praxis     Communication Communication Communication: No apparent difficulties   Cognition Arousal: Alert Behavior During Therapy: WFL for tasks assessed/performed                                 Following commands: Intact        Cueing      Exercises      Shoulder Instructions       General Comments      Pertinent Vitals/ Pain       Pain Assessment Pain Assessment: No/denies pain Faces Pain Scale: No hurt  Home Living  Prior Functioning/Environment              Frequency  Min 2X/week        Progress Toward Goals  OT Goals(current goals can now be found in the care plan section)  Progress towards OT goals: Progressing toward goals  Acute Rehab OT Goals OT Goal Formulation: With patient Time For Goal Achievement: 03/31/24 Potential to Achieve Goals: Good ADL Goals Pt Will Perform Lower Body Dressing: with modified independence;sitting/lateral leans Pt Will Perform Tub/Shower Transfer: Tub transfer;Shower transfer;with modified independence;ambulating Additional ADL Goal #1: pt will follow 3 step command in order to improve cognition for ADLs  Plan      Co-evaluation                 AM-PAC OT 6 Clicks Daily Activity     Outcome  Measure   Help from another person eating meals?: A Little Help from another person taking care of personal grooming?: None   Help from another person bathing (including washing, rinsing, drying)?: A Little Help from another person to put on and taking off regular upper body clothing?: None Help from another person to put on and taking off regular lower body clothing?: A Little 6 Click Score: 17    End of Session    OT Visit Diagnosis: Unsteadiness on feet (R26.81);Other abnormalities of gait and mobility (R26.89);Muscle weakness (generalized) (M62.81);Other symptoms and signs involving cognitive function   Activity Tolerance Patient tolerated treatment well   Patient Left Other (comment) (sitting EOB with all needs within reach)   Nurse Communication Mobility status        Time: 1046-1100 OT Time Calculation (min): 14 min  Charges: OT General Charges $OT Visit: 1 Visit OT Treatments $Self Care/Home Management : 8-22 mins  Lamarr Pouch OT/L  Lamarr JONETTA Pouch 03/19/2024, 11:42 AM

## 2024-03-19 NOTE — Progress Notes (Signed)
   4 Days Post-Op Subjective: Patient is again pacing the room on my arrival.  Per his primary surgeon his mental status was normal on last office visit.  Perhaps related to his STEMI, patient has very poor short-term memory.  Objective: Vital signs in last 24 hours: Temp:  [98.3 F (36.8 C)-99 F (37.2 C)] 98.3 F (36.8 C) (08/19 1142) Pulse Rate:  [70-80] 71 (08/19 1142) Resp:  [15-20] 18 (08/19 1142) BP: (131-148)/(78-87) 142/78 (08/19 1142) SpO2:  [94 %-100 %] 97 % (08/19 1142)  Assessment/Plan: #hematuria #voiding trial   Unfortunately patient requires Plavix  and will for some time.  He may have some slow to resolve residual bleeding, but expect this to improve.  Collected string of bottles shows improvement overnight though.  Hgb is at an acceptable range.  Recommend close follow-up with either urology or PCP for repeat H&H at least once a week until hemoglobin either stabilizes or hematuria resolves.  Safe to discharge from urologic perspective.  Urology will sign off at this time.  Case and plan discussed with Dr. Alvaro  Intake/Output from previous day: 08/18 0701 - 08/19 0700 In: 123 [P.O.:120; I.V.:3] Out: 270 [Urine:270]  Intake/Output this shift: No intake/output data recorded.  Physical Exam:  General: Alert and oriented CV: No cyanosis Lungs: equal chest rise Abdomen: Soft, NTND, no rebound or guarding Gu: Foley catheter removed.  Blood-tinged urine  Lab Results: Recent Labs    03/17/24 0423 03/18/24 0255 03/19/24 0209  HGB 12.3* 11.7* 11.3*  HCT 37.7* 36.1* 34.2*   BMET Recent Labs    03/18/24 0255 03/19/24 0209  NA 138 137  K 3.9 4.1  CL 109 105  CO2 23 22  GLUCOSE 92 100*  BUN 16 14  CREATININE 0.81 0.77  CALCIUM  8.7* 9.0  HGB 11.7* 11.3*  WBC 7.9 9.0     Studies/Results: No results found.    LOS: 4 days   Ole Bourdon, NP [facility] Pager: [phone]  03/19/2024, 1:55 PM

## 2024-03-19 NOTE — Care Management Important Message (Signed)
 Important Message  Patient Details  Name: Jeffery Graves MRN: [id number] Date of Birth: 11-28-1955   Important Message Given:  Yes - Medicare IM     Claretta Deed 03/19/2024, 4:37 PM

## 2024-03-19 NOTE — TOC Transition Note (Addendum)
 Transition of Care [facility]) - Discharge Note   Patient Details  Name: Jeffery Graves MRN: [id number] Date of Birth: 23-Apr-1956  [facility] CM/SW Contact:  Justina Delcia Czar, RN Phone Number: [phone] 03/19/2024, 3:28 PM   Clinical Narrative:    Brother will provide transportation home.  Offered choice for [facility]. Medicare.gov list placed on chart and given to pt.  Contacted [facility] for HH, unable to accept due to staffing. Contacted [facility] rep, Shylise and she will review plan. Will call ICM back with confirmation.   [facility] -declined [facility]-declined [facility]-declined [facility] -declined  [facility]- pending  [facility]- pending./cancelled  [facility] rep, Cory contacted back and they will accept referral for [facility].   Final next level of care: Home w Home Health Services Barriers to Discharge: No Barriers Identified   Patient Goals and CMS Choice Patient states their goals for this hospitalization and ongoing recovery are:: wants to return home CMS Medicare.gov Compare Post Acute Care list provided to:: Patient Choice offered to / list presented to : Patient      Discharge Placement                       Discharge Plan and Services Additional resources added to the After Visit Summary for     Discharge Planning Services: CM Consult Post Acute Care Choice: Home Health                               Social Drivers of Health (SDOH) Interventions SDOH Screenings   Food Insecurity: No Food Insecurity (03/17/2024)  Housing: Low Risk  (03/17/2024)  Transportation Needs: No Transportation Needs (03/17/2024)  Utilities: Not At Risk (03/17/2024)  Depression (PHQ2-9): Low Risk  (06/22/2021)  Social Connections: Unknown (03/17/2024)  Tobacco Use: Low Risk  (03/06/2024)  Recent Concern: Tobacco Use - Medium Risk (02/12/2024)     Readmission Risk Interventions     No data to display

## 2024-03-19 NOTE — Evaluation (Signed)
 Speech Language Pathology Evaluation Patient Details Name: Jeffery Graves MRN: [id number] DOB: [birth year] Today's Date: 03/19/2024 Time: 8675-8593 SLP Time Calculation (min) (ACUTE ONLY): 42 min  Problem List:  Patient Active Problem List   Diagnosis Date Noted   Ischemic cardiomyopathy 03/18/2024   Hypokalemia 03/18/2024   Cardiac arrest (HCC) 03/16/2024   STEMI (ST elevation myocardial infarction) (HCC) 03/15/2024   Prostate cancer (HCC) 03/06/2024   Vitamin D  deficiency 03/13/2014   BPH (benign prostatic hyperplasia) 10/04/2013   DENTAL CARIES 02/10/2010   ERECTILE DYSFUNCTION, SECONDARY TO MEDICATION 02/26/2009   HYPERTENSION, BENIGN ESSENTIAL 08/02/2007   Past Medical History:  Past Medical History:  Diagnosis Date   BPH (benign prostatic hyperplasia)    Hx of radiation therapy 09/06/10 - 10/29/10   prostate, seminal vesicles   Hypertension    on meds   Prostate cancer (HCC) 04/26/2010   gleason 7, vol 20.88 cc   Past Surgical History:  Past Surgical History:  Procedure Laterality Date   BIOPSY PROSTATE     CORONARY/GRAFT ACUTE MI REVASCULARIZATION N/A 03/15/2024   Procedure: Coronary/Graft Acute MI Revascularization;  Surgeon: Elmira Newman PARAS, MD;  Location: [facility];  Service: Cardiovascular;  Laterality: N/A;   CRYOABLATION N/A 03/06/2024   Procedure: CRYOABLATION, PROSTATE, CYSTOSCOPY;  Surgeon: Alvaro Ricardo KATHEE Mickey., MD;  Location: [facility];  Service: Urology;  Laterality: N/A;   HPI:  68 y.o. M admitted 8/15 for STEMI, 15 minute Vfib arrest. S/p Cardiac stent distal RCA. ETT 8/15-8/16. PMH significant for HTN and recurrent prostate Ca.   Assessment / Plan / Recommendation Clinical Impression  Pt's cognition is improved from yesterday per chart review but not likely at baseline.  Demonstrates mild difficulty with functional problem-solving (i.e., using various controls on call bell).  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  He was oriented to  person/place/time but had difficulty verbalizing current situation or reason for hospitalization (He could provide only vague description of events surrounding admission).  He describes a disciplined schedule- waking up early, going to the gym most days of the week. He is retired after years of working in [employer] and lives alone.  He was calm and interactive during today's session. He may benefit from OP SLP assessment along with his OT follow-up to ensure his cognition is progressing back to baseline. Would benefit from intermittent supervision s/p D/C.    SLP Assessment  SLP Recommendation/Assessment: All further Speech Language Pathology needs can be addressed in the next venue of care SLP Visit Diagnosis: Cognitive communication deficit (R41.841)     Assistance Recommended at Discharge  Intermittent Supervision/Assistance                 SLP Evaluation Cognition  Overall Cognitive Status: Impaired/Different from baseline Arousal/Alertness: Awake/alert Orientation Level: Oriented to person;Oriented to place;Other (comment);Oriented to time;Disoriented to situation (had difficulty describing why he was in the hospital) Attention: Selective Selective Attention: Appears intact Memory: Appears intact Problem Solving: Impaired Problem Solving Impairment: Functional basic       Comprehension  Auditory Comprehension Overall Auditory Comprehension: Appears within functional limits for tasks assessed Yes/No Questions: Within Functional Limits Commands: Within Functional Limits Visual Recognition/Discrimination Discrimination: Within Function Limits    Expression Expression Primary Mode of Expression: Verbal Verbal Expression Overall Verbal Expression: Appears within functional limits for tasks assessed Written Expression Written Expression: Not tested   Oral / Motor  Oral Motor/Sensory Function Overall Oral Motor/Sensory Function: Within functional limits Motor  Speech Overall Motor Speech: Appears  within functional limits for tasks assessed            Vona Palma Laurice 03/19/2024, 2:27 PM Palma L. Vona, MA CCC/SLP Clinical Specialist - Acute Care SLP Acute Rehabilitation Services Office number [phone]

## 2024-03-19 NOTE — Plan of Care (Signed)
  Problem: Clinical Measurements: Goal: Will remain free from infection Outcome: Progressing   Problem: Activity: Goal: Risk for activity intolerance will decrease Outcome: Progressing   Problem: Nutrition: Goal: Adequate nutrition will be maintained Outcome: Progressing   Problem: Activity: Goal: Ability to return to baseline activity level will improve Outcome: Progressing   Problem: Nutritional: Goal: Maintenance of adequate nutrition will improve Outcome: Progressing   Problem: Education: Goal: Knowledge of General Education information will improve Description: Including pain rating scale, medication(s)/side effects and non-pharmacologic comfort measures Outcome: Not Progressing   Problem: Health Behavior/Discharge Planning: Goal: Ability to manage health-related needs will improve Outcome: Not Progressing   Problem: Clinical Measurements: Goal: Ability to maintain clinical measurements within normal limits will improve Outcome: Not Progressing   Problem: Health Behavior/Discharge Planning: Goal: Ability to safely manage health-related needs after discharge will improve Outcome: Not Progressing   Problem: Health Behavior/Discharge Planning: Goal: Ability to manage health-related needs will improve Outcome: Not Progressing

## 2024-03-19 NOTE — Discharge Summary (Signed)
 Physician Discharge Summary   Patient: Jeffery Graves MRN: [id number] DOB: 15-Sep-1955  Admit date:     03/15/2024  Discharge date: 03/19/24  Discharge Physician: Concepcion Riser   PCP: Delbert Clam, MD   Recommendations at discharge:    PCP follow up in 1 week with repeat CBC, BMP. Cardiology follow up as suggested. Urology follow up in as scheduled  Discharge Diagnoses: Principal Problem:   STEMI (ST elevation myocardial infarction) [facility]) Active Problems:   Cardiac arrest (HCC)   Ischemic cardiomyopathy   Hypokalemia  Resolved Problems:   * No resolved hospital problems. *  Hospital Course: Jeffery Graves is a 68 y.o. M with PMH significant for HTN and recurrent prostate Ca with obstructive voiding, last seen in urology clinic 03/06/24 and plan to proceed with salvage cryo for locally recurrent prostate cancer who was in a grocery store when he had a collapse with possible seizure like activity.  EMS happened to be in the store and started CPR in about one minute, found to be in a shockable rhythm and received about 4-6 defibrillations from the AED and approximately 15 mins CPR.  His initial EKG showed inferolateral STEMI.  Pt had ROSC at the time of ED arrival and was groaning but without purposeful movement.    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 Patient transferred to TRH service 03/17/24 for further management. He is continued on DAPT. Urology consulted for hematuria, foley removed advised to observe for another day as he is on plavix .   Assessment and Plan: Cardiac arrest-outside of hospital. S/p CPR, ACLS protocol. S/p emergent cath PCI to r PLA. Cardiology follow up appreciated. Continue DAPT (aspirin +Plavix ), statin. Patient seems to have cognitive impairment, discussed with brother, TOC. HHRN for medication administration and compliance ordered.  I advised to follow  PCP with repeat Hb check in a week. Cardiology follow up as scheduled.   Systolic CHF. Ischemic cardiomyopathy. EF 40%. GDMT as per cardiology. Scripts for metoprolol , Losartan , aldactone  sent to out pharmacy.   Acute hypoxic Respiratory Failure S/p cardiac arrest. S/p extubated 8/16, continue to monitor saturation closely.   Hypertension- Continue GDMT. BP stable.   Prostate Cancer Hematuria  s/p recent prostate cryoablation 03/06/24. Foley catheter removed, he did have hematuria which seems to be clearing. Urology evaluated him, advised weekly Hb monitoring as he is on asa+plavix .  Urology follow up with Dr. Alvaro as scheduled.        Consultants: Cardiology, urology Procedures performed: s/p cardiac cath  Disposition: Home Diet recommendation:  Discharge Diet Orders (From admission, onward)     Start     Ordered   03/19/24 0000  Diet - low sodium heart healthy        03/19/24 1432           Cardiac diet DISCHARGE MEDICATION: Allergies as of 03/19/2024   No Known Allergies      Medication List     STOP taking these medications    cephALEXin  500 MG capsule Commonly known as: KEFLEX    traMADol  50 MG tablet Commonly known as: Ultram    valsartan -hydrochlorothiazide  160-25 MG tablet Commonly known as: DIOVAN -HCT       TAKE these medications    aspirin  81 MG chewable tablet Chew 1 tablet (81 mg total) by mouth daily.   atorvastatin  80 MG tablet Commonly known as: LIPITOR  Take 1 tablet (80 mg total)  by mouth daily.   clopidogrel  75 MG tablet Commonly known as: PLAVIX  Take 1 tablet (75 mg total) by mouth daily.   losartan  25 MG tablet Commonly known as: COZAAR  Take 2 tablets (50 mg total) by mouth daily.   LUMIFY  OP Place 1 drop into both eyes daily.   magnesium  oxide 400 (240 Mg) MG tablet Commonly known as: MAG-OX Take 1 tablet (400 mg total) by mouth daily for 10 days.   metoprolol  succinate 25 MG 24 hr tablet Commonly known as:  TOPROL -XL Take 1 tablet (25 mg total) by mouth daily.   potassium chloride  SA 20 MEQ tablet Commonly known as: KLOR-CON  M Take 1 tablet (20 mEq total) by mouth daily for 10 days.   spironolactone  25 MG tablet Commonly known as: ALDACTONE  Take 1 tablet (25 mg total) by mouth daily.   Stool Softener/Laxative 50-8.6 MG tablet Generic drug: senna-docusate Take 1 tablet by mouth 2 (two) times daily. While taking pain meds to prevent constipation   tamsulosin  0.4 MG Caps capsule Commonly known as: FLOMAX  Take 1 capsule (0.4 mg total) by mouth 2 (two) times daily.   Xtandi 80 MG tablet Generic drug: enzalutamide Take 80 mg by mouth daily.        Follow-up Information     [facility] Follow up on 03/25/2024.   Specialty: Cardiology Why: at 1:30 Contact information: [address] [place]  [address] [phone]        Delbert Clam, MD Follow up.   Specialty: Family Medicine Why: please call to arrange hospital follow up in 7-14 days Contact information: [address] [place] [address] [phone]                Discharge Exam: Jeffery Graves   03/17/24 1652  Weight: 77.7 kg      03/19/2024   11:42 AM 03/19/2024    9:12 AM 03/19/2024    7:38 AM  Vitals with BMI  Systolic 142 131 868  Diastolic 78 79 79  Pulse 71 76 76   General - Elderly African American male, no apparent distress HEENT - PERRLA, EOMI, atraumatic head, non tender sinuses. Lung - Clear, basal rales, rhonchi, no wheezes. Heart - S1, S2 heard, no murmurs, rubs, trace pedal edema. Abdomen - Soft, non tender, bowel sounds good, foley removed. Neuro - Alert, awake and oriented, non focal exam. Skin - Warm and dry.  Condition at discharge: stable  The results of significant diagnostics from this hospitalization (including imaging, microbiology, ancillary and laboratory) are listed below for reference.    Imaging Studies: ECHOCARDIOGRAM COMPLETE Result Date: 03/16/2024    ECHOCARDIOGRAM REPORT   Patient Name:   Jeffery Graves Date of Exam: 03/16/2024 Medical Rec #:  [id number]     Height:       66.0 in Accession #:    [id number]    Weight:       170.0 lb Date of Birth:  14-May-1956     BSA:          1.866 m Patient Age:    68 years      BP:           119/72 mmHg Patient Gender: M             HR:           75 bpm. Exam Location:  Inpatient Procedure: 2D Echo, Color Doppler, Cardiac Doppler and Intracardiac  Opacification Agent (Both Spectral and Color Flow Doppler were            utilized during procedure). Indications:     Cardiac Arrest I46.9  History:         Patient has no prior history of Echocardiogram examinations.                  Risk Factors:Hypertension.  Sonographer:     Tinnie Gosling RDCS Referring Phys:  [id number] B[address]IMHON Diagnosing Phys: Vina Gull MD IMPRESSIONS  1. Poor acoustic windows Definity  used. Hypokinesis of the inferoseptal, inferior, inferolateral walls (severe in mid segment). Left ventricular ejection fraction, by estimation, is 40%. There is mild concentric left ventricular hypertrophy. Left ventricular diastolic parameters are indeterminate.  2. Right ventricular systolic function is normal. The right ventricular size is normal.  3. Trivial mitral valve regurgitation.  4. The aortic valve is tricuspid. Aortic valve regurgitation is not visualized.  5. The inferior vena cava is normal in size with greater than 50% respiratory variability, suggesting right atrial pressure of 3 mmHg. FINDINGS  Left Ventricle: Poor acoustic windows Definity  used. Hypokinesis of the inferoseptal, inferior, inferolateral walls (severe in mid segment). Left ventricular ejection fraction, by estimation, is 40%. Definity  contrast agent was given IV to delineate the  left ventricular endocardial borders. The left ventricular internal cavity size was small. There is mild concentric left  ventricular hypertrophy. Left ventricular diastolic parameters are indeterminate. Right Ventricle: The right ventricular size is normal. Right vetricular wall thickness was not assessed. Right ventricular systolic function is normal. Left Atrium: Left atrial size was normal in size. Right Atrium: Right atrial size was normal in size. Pericardium: There is no evidence of pericardial effusion. Mitral Valve: There is mild thickening of the mitral valve leaflet(s). Trivial mitral valve regurgitation. Tricuspid Valve: The tricuspid valve is normal in structure. Tricuspid valve regurgitation is trivial. Aortic Valve: The aortic valve is tricuspid. Aortic valve regurgitation is not visualized. Pulmonic Valve: The pulmonic valve was not well visualized. Aorta: The aortic root and ascending aorta are structurally normal, with no evidence of dilitation. Venous: The inferior vena cava is normal in size with greater than 50% respiratory variability, suggesting right atrial pressure of 3 mmHg. IAS/Shunts: No atrial level shunt detected by color flow Doppler.  LEFT VENTRICLE PLAX 2D LVIDd:         3.20 cm      Diastology LVIDs:         2.60 cm      LV e' medial:    6.20 cm/s LV PW:         1.30 cm      LV E/e' medial:  13.4 LV IVS:        1.20 cm      LV e' lateral:   8.81 cm/s LVOT diam:     2.20 cm      LV E/e' lateral: 9.4 LV SV:         59 LV SV Index:   32 LVOT Area:     3.80 cm  LV Volumes (MOD) LV vol d, MOD A2C: 126.0 ml LV vol d, MOD A4C: 76.3 ml LV vol s, MOD A2C: 69.1 ml LV vol s, MOD A4C: 64.6 ml LV SV MOD A2C:     56.9 ml LV SV MOD A4C:     76.3 ml LV SV MOD BP:      31.5 ml RIGHT VENTRICLE  IVC TAPSE (M-mode): 2.9 cm  IVC diam: 1.70 cm LEFT ATRIUM             Index        RIGHT ATRIUM           Index LA diam:        3.30 cm 1.77 cm/m   RA Area:     16.50 cm LA Vol (A2C):   65.4 ml 35.04 ml/m  RA Volume:   47.10 ml  25.24 ml/m LA Vol (A4C):   47.8 ml 25.61 ml/m LA Biplane Vol: 56.1 ml 30.06 ml/m  AORTIC  VALVE LVOT Vmax:   83.00 cm/s LVOT Vmean:  57.600 cm/s LVOT VTI:    0.156 m  AORTA Ao Root diam: 2.80 cm Ao Asc diam:  3.70 cm MITRAL VALVE MV Area (PHT): 3.36 cm     SHUNTS MV Decel Time: 226 msec     Systemic VTI:  0.16 m MV E velocity: 82.80 cm/s   Systemic Diam: 2.20 cm MV A velocity: 105.00 cm/s MV E/A ratio:  0.79 Vina Gull MD Electronically signed by Vina Gull MD Signature Date/Time: 03/16/2024/4:13:24 PM    Final (Updated)    CT HEAD WO CONTRAST ( ) Result Date: 03/15/2024 EXAM: CT HEAD WITHOUT CONTRAST 03/15/2024 02:31:00 PM TECHNIQUE: CT of the head was performed without the administration of intravenous contrast. Automated exposure control, iterative reconstruction, and/or weight based adjustment of the mA/kV was utilized to reduce the radiation dose to as low as reasonably achievable. COMPARISON: None available. CLINICAL HISTORY: Mental status change, unknown cause. ams; Ams; Fall? FINDINGS: BRAIN AND VENTRICLES: There are encephalomalacia changes present at the right parietooccipital junction with expected dilatation of the posterior horn of the right lateral ventricle. No acute hemorrhage. Gray-white differentiation is preserved. No hydrocephalus. No extra-axial collection. No mass effect or midline shift. ORBITS: No acute abnormality. SINUSES: There is mild mucosal disease within the left maxillary sinus. No acute abnormality. SOFT TISSUES AND SKULL: Orotracheal Tube and gastric tube are present. No acute soft tissue abnormality. No skull fracture. IMPRESSION: 1. No acute intracranial abnormality. 2. Encephalomalacia changes at the right parietooccipital junction with ex vacuo dilatation of the posterior horn of the right lateral ventricle. 3. Mild mucosal disease within the left maxillary sinus. Electronically signed by: evalene coho 03/15/2024 03:32 PM EDT RP Workstation: [id number]   CT CERVICAL SPINE WO CONTRAST Result Date: 03/15/2024 CLINICAL DATA:  Neck trauma (Age >= 65y).  Altered mental status. Question fall EXAM: CT CERVICAL SPINE WITHOUT CONTRAST TECHNIQUE: Multidetector CT imaging of the cervical spine was performed without intravenous contrast. Multiplanar CT image reconstructions were also generated. RADIATION DOSE REDUCTION: This exam was performed according to the departmental dose-optimization program which includes automated exposure control, adjustment of the mA and/or kV according to patient size and/or use of iterative reconstruction technique. COMPARISON:  None Available. FINDINGS: Alignment: Normal. Skull base and vertebrae: No acute fracture. No aggressive appearing focal osseous lesion or focal pathologic process. Soft tissues and spinal canal: No prevertebral fluid or swelling. No visible canal hematoma. Upper chest: Marked patchy peribronchovascular ground-glass and dependent consolidative airspace opacities. Other: Periapical lucencies along the posterior most remaining right mandibular tooth. Endotracheal tube in appropriate position noted on scout. Enteric tube partially visualized coursing within the esophagus. Acute displaced right anterior first rib fracture. IMPRESSION: 1. No acute displaced fracture or traumatic listhesis of the cervical spine. 2. Marked patchy peribronchovascular ground-glass and dependent consolidative airspace opacities. Finding is not well visualized on chest x-ray  03/15/2024 3:08 p.m. 3. Acute displaced right anterior first rib fracture. No associated right apical pneumothorax. 4. Endotracheal tube in appropriate position. 5. Recommend CT chest with intravenous contrast for further evaluation. 6. Please see separately dictated CT head 03/15/2024. Electronically Signed   By: Morgane  Naveau M.D.   On: 03/15/2024 15:30   DG CHEST PORT 1 VIEW Result Date: 03/15/2024 CLINICAL DATA:  Endotracheal tube placement. EXAM: PORTABLE CHEST 1 VIEW COMPARISON:  Same day. FINDINGS: Endotracheal tube is in grossly good position. Nasogastric tube is  seen entering stomach. IMPRESSION: Endotracheal tube in grossly good position. Electronically Signed   By: Lynwood Landy Raddle M.D.   On: 03/15/2024 15:08   DG Abdomen 1 View Result Date: 03/15/2024 CLINICAL DATA:  Orogastric tube placement. EXAM: ABDOMEN - 1 VIEW COMPARISON:  None Available. FINDINGS: Distal tip of nasogastric tube is seen in expected position of proximal stomach. No abnormal bowel dilatation. IMPRESSION: Nasogastric tube tip seen in proximal stomach. Electronically Signed   By: Lynwood Landy Raddle M.D.   On: 03/15/2024 15:08   CARDIAC CATHETERIZATION Result Date: 03/15/2024 Images from the original result were not included. Coronary angiography & intervention 03/15/2024: LM: Normal LAD: Minimal luminal irregularities Lcx: Minimal luminal irregularities RCA: Large, dominant vessel          Occluded distal R PLA, culprit lesion          Focal 80% stenosis in small caliber RPDA, not culprit lesion LVEDP 5 mmHg Successful percutaneous coronary intervention RPLA        PTCA and stent placement 2.25 X 20 mm Synergy drug-eluting stent        Postdilatation using 2.5 x 12 mm [place] balloon up to 20 atm Patient is hemodynamically and electrically stable.  Admit to ICU.  Prognosis depends on neurological recovery. Newman JINNY Lawrence, MD  DG Chest Portable 1 View Result Date: 03/15/2024 CLINICAL DATA:  Acute myocardial infarction. EXAM: PORTABLE CHEST 1 VIEW COMPARISON:  05/28/2008 FINDINGS: No significant change in enlargement of the cardiac silhouette. Clear lungs with normal vascularity. Endotracheal tube with its tip 1.9 cm above the carina. It is recommended that this be retracted 4 cm. Nasogastric tube tip in the proximal stomach and side hole in the region of the gastroesophageal junction. It is recommended that this be advanced 6 cm. Mild scoliosis. IMPRESSION: 1. Endotracheal tube tip 1.9 cm above the carina. It is recommended that this be retracted 4 cm. 2. Nasogastric tube tip in the proximal stomach  and side hole in the region of the gastroesophageal junction. It is recommended that this be advanced 6 cm. 3. Stable cardiomegaly. Electronically Signed   By: Elspeth Bathe M.D.   On: 03/15/2024 12:19    Microbiology: Results for orders placed or performed during the hospital encounter of 03/15/24  MRSA Next Gen by PCR, Nasal     Status: None   Collection Time: 03/15/24  1:31 PM   Specimen: Nasal Mucosa; Nasal Swab  Result Value Ref Range Status   MRSA by PCR Next Gen NOT DETECTED NOT DETECTED Final    Comment: (NOTE) The GeneXpert MRSA Assay (FDA approved for NASAL specimens only), is one component of a comprehensive MRSA colonization surveillance program. It is not intended to diagnose MRSA infection nor to guide or monitor treatment for MRSA infections. Test performance is not FDA approved in patients less than 21 years old. Performed at [facility], [address] [place], [place] [address]   Culture, blood (Routine X 2)  w Reflex to ID Panel     Status: None (Preliminary result)   Collection Time: 03/15/24  2:02 PM   Specimen: BLOOD LEFT HAND  Result Value Ref Range Status   Specimen Description BLOOD LEFT HAND  Final   Special Requests   Final    BOTTLES DRAWN AEROBIC ONLY Blood Culture results may not be optimal due to an inadequate volume of blood received in culture bottles   Culture   Final    NO GROWTH 4 DAYS Performed at [facility], [address] [place], [place] [address]    Report Status PENDING  Incomplete  Culture, blood (Routine X 2) w Reflex to ID Panel     Status: None (Preliminary result)   Collection Time: 03/15/24  2:02 PM   Specimen: BLOOD LEFT ARM  Result Value Ref Range Status   Specimen Description BLOOD LEFT ARM  Final   Special Requests   Final    BOTTLES DRAWN AEROBIC ONLY Blood Culture results may not be optimal due to an inadequate volume of blood received in culture bottles   Culture   Final    NO GROWTH 4 DAYS Performed at  [facility], [address], [place], [place] [address]    Report Status PENDING  Incomplete    Labs: CBC: Recent Labs  Lab 03/15/24 1139 03/15/24 1221 03/16/24 0414 03/17/24 0423 03/18/24 0255 03/19/24 0209  WBC 17.0*  --  10.2 7.8 7.9 9.0  NEUTROABS 12.0*  --   --   --   --   --   HGB 13.4 13.3 12.7* 12.3* 11.7* 11.3*  HCT 41.9 39.0 39.9 37.7* 36.1* 34.2*  MCV 83.8  --  84.5 82.3 82.8 81.4  PLT 213  --  191 177 174 189   Basic Metabolic Panel: Recent Labs  Lab 03/15/24 2114 03/16/24 0414 03/16/24 1020 03/17/24 0423 03/18/24 0255 03/19/24 0209  NA 135 132* 136 138 138 137  K 2.9* 3.5 3.6 3.6 3.9 4.1  CL 97* 99 102 106 109 105  CO2 23 24 24 24 23 22   GLUCOSE 100* 121* 114* 96 92 100*  BUN 12 10 9 9 16 14   CREATININE 0.88 0.79 0.80 0.87 0.81 0.77  CALCIUM  8.7* 8.4* 8.7* 8.8* 8.7* 9.0  MG 2.1 2.0  --  1.9 1.7 1.6*   Liver Function Tests: Recent Labs  Lab 03/15/24 1139 03/16/24 0414  AST 132* 158*  ALT 111* 91*  ALKPHOS 86 70  BILITOT 1.1 0.8  PROT 7.0 6.3*  ALBUMIN 3.5 3.1*   CBG: Recent Labs  Lab 03/18/24 0752 03/18/24 1136 03/18/24 1640 03/18/24 2025 03/19/24 0458  GLUCAP 111* 104* 95 103* 100*    Discharge time spent: 36 minutes.  Signed: Concepcion Riser, MD [facility] 03/19/2024

## 2024-03-20 ENCOUNTER — Telehealth: Payer: Self-pay

## 2024-03-20 ENCOUNTER — Other Ambulatory Visit: Payer: Self-pay

## 2024-03-20 LAB — CULTURE, BLOOD (ROUTINE X 2)
Culture: NO GROWTH
Culture: NO GROWTH

## 2024-03-20 NOTE — Telephone Encounter (Signed)
 Please see patient request for call back from PCP   Copied from CRM #[id number]. Topic: Clinical - Medical Advice >> Mar 20, 2024  9:54 AM Winona R wrote: Pt has concerns about the medications he's currently taking after heart attack. He believes its a lot of medication.

## 2024-03-20 NOTE — Transitions of Care (Post Inpatient/ED Visit) (Signed)
 03/20/2024  Name: Jeffery [facility] MRN: [id number] DOB: March 16, 1956  Today's TOC FU Call Status: Today's TOC FU Call Status:: Successful TOC FU Call Completed TOC FU Call Complete Date: 03/20/24 Patient's Name and Date of Birth confirmed.  Transition Care Management Follow-up Telephone Call Date of Discharge: 03/19/24 Discharge Facility: [facility]) Type of Discharge: Inpatient Admission Primary Inpatient Discharge Diagnosis:: STEMI How have you been since you were released from the [facility]?: Better (He stated he is feeling good.) Any questions or concerns?: Yes Patient Questions/Concerns:: He had questions about his medications and came to the clinic with the meds to review.  He had the bottles separated by the meds he is taking in the morning and those that he is taking in the evening. He said that a pharmacist labeled them for him with how many pills he is to take and if they should be in the am and/or pm and he wants to make sure he understands what he was told.  He has all medications except the stool softener and he said he does not have any problems with his bowels now  He is not able to read and has a difficult time following the instructions on the medication bottles and AVS.  He wanted to review what the pharmacy had set up  for him, which he did multiple times and was able to state correctly which medication he is taking in the morning and which he is taking in the evening and how many pills he is taking.  All medications could be taken in the morning to make it easier and only the tamsulosin  would be twice daily but he kept referring to the pills that he is supposed to take in the pm.  So to create less confusion with making changes in the schedule, he decided to take them as instructed by the pharmacist.  when he left he was able to correctly state which how many pills of each medication he is to take and when he is to take it. Patient Questions/Concerns Addressed: Other: (I met with  the patient in the clinic.)  Items Reviewed: Did you receive and understand the discharge instructions provided?: Yes Medications obtained,verified, and reconciled?: Yes (Medications Reviewed) (the patient said he has questions about his medications and he is not able to read. He said he would bring his medications to the clinic this afternoon for me to review with him.) Any new allergies since your discharge?: No Dietary orders reviewed?: Yes Type of Diet Ordered:: heart healthy, low sodium Do you have support at home?: Yes People in Home [RPT]: sibling(s) Name of Support/Comfort Primary Source: his brother  Medications Reviewed Today: Medications Reviewed Today     Reviewed by Jeffery Bradley, RN (Case Manager) on 03/20/24 at 1738  Med List Status: <None>   Medication Order Taking? Sig Documenting Provider Last Dose Status Informant  aspirin  81 MG chewable tablet [id number]  Chew 1 tablet (81 mg total) by mouth daily. Jeffery Pore, MD  Active   atorvastatin  (LIPITOR ) 80 MG tablet [id number]  Take 1 tablet (80 mg total) by mouth daily. Jeffery Pore, MD  Active   Brimonidine  Tartrate (LUMIFY  OP) [id number]  Place 1 drop into both eyes daily. [provider]  Active Self, Pharmacy Records, Multiple Informants  clopidogrel  (PLAVIX ) 75 MG tablet [id number]  Take 1 tablet (75 mg total) by mouth daily. Jeffery Pore, MD  Active   losartan  (COZAAR ) 25 MG tablet [id number]  Take 2 tablets (50 mg total) by  mouth daily. Jeffery Pore, MD  Active   magnesium  oxide (MAG-OX) 400 (240 Mg) MG tablet [id number]  Take 1 tablet (400 mg total) by mouth daily for 10 days. Jeffery Pore, MD  Active   metoprolol  succinate (TOPROL -XL) 25 MG 24 hr tablet [id number]  Take 1 tablet (25 mg total) by mouth daily. Jeffery Pore, MD  Active   potassium chloride  SA (KLOR-CON  M) 20 MEQ tablet [id number]  Take 1 tablet (20 mEq total) by mouth daily for 10 days. Jeffery Pore, MD  Active   senna-docusate (SENOKOT-S) 8.6-50 MG tablet [id number]  Take 1 tablet by mouth 2 (two) times daily. While taking pain meds to prevent constipation [facility], Jeffery KATHEE Raddle., MD  Active Self, Pharmacy Records, Multiple Informants  spironolactone  (ALDACTONE ) 25 MG tablet [id number]  Take 1 tablet (25 mg total) by mouth daily. Jeffery Pore, MD  Active   tamsulosin  (FLOMAX ) 0.4 MG CAPS capsule [id number]  Take 1 capsule (0.4 mg total) by mouth 2 (two) times daily.   Active Self, Pharmacy Records, Multiple Informants           Med Note Jeffery [facility]   Fri Mar 15, 2024  8:31 PM) LF: 03/12/24 for a 30ds  XTANDI 80 MG tablet [id number]  Take 80 mg by mouth daily. [provider]  Active Self, Pharmacy Records, Multiple Informants           Med Note Jeffery, ALFREIDA Graves   Fri Mar 15, 2024  8:31 PM) LF: 02/26/24 for a 30ds            Home Care and Equipment/Supplies: Were Home Health Services Ordered?: Yes Name of Home Health Agency:: [facility] Has Agency set up a time to come to your home?: No EMR reviewed for Home Health Orders: Orders present/patient has not received call (refer to CM for follow-up) (I spoke to [facility] and she confirmed that they have the referral and will be calling the patient to schedule a home visit.) Any new equipment or medical supplies ordered?: No  Functional Questionnaire: Do you need assistance with bathing/showering or dressing?: No Do you need assistance with meal preparation?: No Do you need assistance with eating?: No Do you have difficulty maintaining continence: No Do you need assistance with getting out of bed/getting out of a chair/moving?: No Do you have difficulty managing or taking your medications?: Yes (His brother said he will be assisting him)  Follow up appointments reviewed: PCP Follow-up appointment confirmed?: Yes Date of PCP follow-up appointment?: 04/16/24 Follow-up Provider: Haze Servant,  NP Specialist [facility] Follow-up appointment confirmed?: Yes Date of Specialist follow-up appointment?: 03/25/24 Follow-Up Specialty Provider:: [facility].  I told the patient I will call urology to schedule a follow up appointment if needed Do you need transportation to your follow-up appointment?: No Do you understand care options if your condition(s) worsen?: Yes-patient verbalized understanding    SIGNATURE Slater Diesel, RN

## 2024-03-20 NOTE — Telephone Encounter (Signed)
 Patient coming to the clinic to discuss with J.Brazeau, CM

## 2024-03-21 ENCOUNTER — Telehealth: Payer: Self-pay

## 2024-03-21 NOTE — Telephone Encounter (Signed)
 I called [facility] and schedule an appointment for the patient with dr Alvaro 03/26/2024 @ 1430.   I then called the patient and informed him of the appointment. He said he will write it down when he gets home.  I asked him if he wanted to me text him the appointment information and he said no. He doesn't read so the text message would not help him.  He said he will remember to write it down. He repeated the correct appointment information multiple times and also stated he correctly stated where Dr Haroldine office is.   Regarding his medication, he said he is doing fine. He explained that he took the medications this morning that say AM and tonight he is going to take the medications that say PM.  He also said that the prostate medication, Tamsulosin , is what it takes AM and PM and he takes 1 pill in the AM and 1 pill in the PM.  He said he understands his medications now and doesn't want to change anything.   He said that the home health agency called him and he thinks they are coming out on Saturday but they will need to call him with a visit time.  He did not have any questions, and I told him to please call this office if he does have questions/concerns and he said he understood.

## 2024-03-22 ENCOUNTER — Telehealth (HOSPITAL_COMMUNITY): Payer: Self-pay

## 2024-03-22 ENCOUNTER — Encounter (HOSPITAL_COMMUNITY): Payer: Self-pay

## 2024-03-22 NOTE — Telephone Encounter (Signed)
 Attempted to call patient to check interest in cardiac rehab- no answer, left message. Mailed letter.

## 2024-03-23 DIAGNOSIS — I5020 Unspecified systolic (congestive) heart failure: Secondary | ICD-10-CM | POA: Diagnosis not present

## 2024-03-23 DIAGNOSIS — Z7902 Long term (current) use of antithrombotics/antiplatelets: Secondary | ICD-10-CM | POA: Diagnosis not present

## 2024-03-23 DIAGNOSIS — Z9181 History of falling: Secondary | ICD-10-CM | POA: Diagnosis not present

## 2024-03-23 DIAGNOSIS — I110 Hypertensive heart disease with heart failure: Secondary | ICD-10-CM | POA: Diagnosis not present

## 2024-03-23 DIAGNOSIS — S2231XD Fracture of one rib, right side, subsequent encounter for fracture with routine healing: Secondary | ICD-10-CM | POA: Diagnosis not present

## 2024-03-23 DIAGNOSIS — J9601 Acute respiratory failure with hypoxia: Secondary | ICD-10-CM | POA: Diagnosis not present

## 2024-03-23 DIAGNOSIS — I255 Ischemic cardiomyopathy: Secondary | ICD-10-CM | POA: Diagnosis not present

## 2024-03-23 DIAGNOSIS — Z8674 Personal history of sudden cardiac arrest: Secondary | ICD-10-CM | POA: Diagnosis not present

## 2024-03-23 DIAGNOSIS — Z48812 Encounter for surgical aftercare following surgery on the circulatory system: Secondary | ICD-10-CM | POA: Diagnosis not present

## 2024-03-23 DIAGNOSIS — I2119 ST elevation (STEMI) myocardial infarction involving other coronary artery of inferior wall: Secondary | ICD-10-CM | POA: Diagnosis not present

## 2024-03-23 DIAGNOSIS — Z955 Presence of coronary angioplasty implant and graft: Secondary | ICD-10-CM | POA: Diagnosis not present

## 2024-03-23 DIAGNOSIS — I081 Rheumatic disorders of both mitral and tricuspid valves: Secondary | ICD-10-CM | POA: Diagnosis not present

## 2024-03-23 DIAGNOSIS — E876 Hypokalemia: Secondary | ICD-10-CM | POA: Diagnosis not present

## 2024-03-23 DIAGNOSIS — Z7982 Long term (current) use of aspirin: Secondary | ICD-10-CM | POA: Diagnosis not present

## 2024-03-25 ENCOUNTER — Telehealth: Payer: Self-pay

## 2024-03-25 ENCOUNTER — Ambulatory Visit (HOSPITAL_COMMUNITY): Payer: Self-pay | Admitting: Physician Assistant

## 2024-03-25 ENCOUNTER — Other Ambulatory Visit (HOSPITAL_COMMUNITY): Payer: Self-pay

## 2024-03-25 ENCOUNTER — Telehealth (HOSPITAL_COMMUNITY): Payer: Self-pay

## 2024-03-25 ENCOUNTER — Encounter (HOSPITAL_COMMUNITY): Payer: Self-pay

## 2024-03-25 ENCOUNTER — Other Ambulatory Visit: Payer: Self-pay

## 2024-03-25 ENCOUNTER — Ambulatory Visit (HOSPITAL_COMMUNITY)
Admission: RE | Admit: 2024-03-25 | Discharge: 2024-03-25 | Disposition: A | Discharge status: Home / Self Care | Source: Ambulatory Visit | Attending: Physician Assistant

## 2024-03-25 VITALS — BP 154/76 | HR 61 | Ht 66.0 in | Wt 170.6 lb

## 2024-03-25 DIAGNOSIS — Z8546 Personal history of malignant neoplasm of prostate: Secondary | ICD-10-CM | POA: Insufficient documentation

## 2024-03-25 DIAGNOSIS — I10 Essential (primary) hypertension: Secondary | ICD-10-CM | POA: Diagnosis not present

## 2024-03-25 DIAGNOSIS — I5020 Unspecified systolic (congestive) heart failure: Secondary | ICD-10-CM | POA: Diagnosis not present

## 2024-03-25 DIAGNOSIS — Z79899 Other long term (current) drug therapy: Secondary | ICD-10-CM | POA: Diagnosis not present

## 2024-03-25 DIAGNOSIS — Z7902 Long term (current) use of antithrombotics/antiplatelets: Secondary | ICD-10-CM | POA: Diagnosis not present

## 2024-03-25 DIAGNOSIS — I110 Hypertensive heart disease with heart failure: Secondary | ICD-10-CM | POA: Insufficient documentation

## 2024-03-25 DIAGNOSIS — Z7982 Long term (current) use of aspirin: Secondary | ICD-10-CM | POA: Insufficient documentation

## 2024-03-25 DIAGNOSIS — I255 Ischemic cardiomyopathy: Secondary | ICD-10-CM | POA: Diagnosis not present

## 2024-03-25 DIAGNOSIS — I252 Old myocardial infarction: Secondary | ICD-10-CM | POA: Diagnosis not present

## 2024-03-25 DIAGNOSIS — Z8674 Personal history of sudden cardiac arrest: Secondary | ICD-10-CM | POA: Diagnosis not present

## 2024-03-25 DIAGNOSIS — I4901 Ventricular fibrillation: Secondary | ICD-10-CM | POA: Diagnosis not present

## 2024-03-25 DIAGNOSIS — Z955 Presence of coronary angioplasty implant and graft: Secondary | ICD-10-CM | POA: Diagnosis not present

## 2024-03-25 DIAGNOSIS — I2510 Atherosclerotic heart disease of native coronary artery without angina pectoris: Secondary | ICD-10-CM | POA: Insufficient documentation

## 2024-03-25 LAB — BASIC METABOLIC PANEL WITH GFR
Anion gap: 6 (ref 5–15)
BUN: 10 mg/dL (ref 8–23)
CO2: 24 mmol/L (ref 22–32)
Calcium: 8.9 mg/dL (ref 8.9–10.3)
Chloride: 105 mmol/L (ref 98–111)
Creatinine, Ser: 0.83 mg/dL (ref 0.61–1.24)
GFR, Estimated: >60 mL/min (ref >60)
Glucose, Bld: 98 mg/dL (ref 70–99)
Potassium: 4.2 mmol/L (ref 3.5–5.1)
Sodium: 135 mmol/L (ref 135–145)

## 2024-03-25 LAB — BRAIN NATRIURETIC PEPTIDE: B Natriuretic Peptide: 328.9 pg/mL — ABNORMAL HIGH (ref 0.0–100.0)

## 2024-03-25 MED ORDER — SACUBITRIL-VALSARTAN 97-103 MG PO TABS: 1.0000 | ORAL_TABLET | Freq: Two times a day (BID) | ORAL | Status: DC

## 2024-03-25 MED ORDER — ATORVASTATIN CALCIUM 80 MG PO TABS
80.0000 mg | ORAL_TABLET | Freq: Every day | ORAL | 2 refills | Status: AC
  Filled 2024-03-25 – 2024-06-11 (×2): qty 90, 90d supply, fill #0
  Filled ????-??-??: fill #1

## 2024-03-25 MED ORDER — ENTRESTO 97-103 MG PO TABS
1.0000 | ORAL_TABLET | Freq: Two times a day (BID) | ORAL | 2 refills | Status: DC
  Filled 2024-03-25: qty 90, 45d supply, fill #0

## 2024-03-25 MED ORDER — ENTRESTO 97-103 MG PO TABS
1.0000 | ORAL_TABLET | Freq: Two times a day (BID) | ORAL | 2 refills | Status: DC
  Filled 2024-03-25: qty 90, 45d supply, fill #0
  Filled 2024-05-02: qty 90, 45d supply, fill #1
  Filled 2024-06-17: qty 90, 45d supply, fill #2

## 2024-03-25 MED ORDER — METOPROLOL SUCCINATE ER 25 MG PO TB24
25.0000 mg | ORAL_TABLET | Freq: Every day | ORAL | 2 refills | Status: AC
Start: 2024-03-25 — End: ?
  Filled 2024-03-25 – 2024-06-11 (×2): qty 90, 90d supply, fill #0
  Filled ????-??-??: fill #1

## 2024-03-25 MED ORDER — CLOPIDOGREL BISULFATE 75 MG PO TABS
75.0000 mg | ORAL_TABLET | Freq: Every day | ORAL | 2 refills | Status: AC
  Filled 2024-03-25 – 2024-06-11 (×2): qty 90, 90d supply, fill #0
  Filled ????-??-??: fill #1

## 2024-03-25 MED ORDER — SPIRONOLACTONE 25 MG PO TABS
25.0000 mg | ORAL_TABLET | Freq: Every day | ORAL | 2 refills | Status: AC
  Filled 2024-03-25 – 2024-06-11 (×2): qty 90, 90d supply, fill #0
  Filled ????-??-??: fill #1

## 2024-03-25 MED ORDER — ASPIRIN 81 MG PO CHEW
81.0000 mg | CHEWABLE_TABLET | Freq: Every day | ORAL | 2 refills | Status: AC
  Filled 2024-03-25 – 2024-06-11 (×2): qty 90, 90d supply, fill #0
  Filled 2024-09-05: qty 90, 90d supply, fill #1

## 2024-03-25 NOTE — Telephone Encounter (Signed)
Verbal orders given for patient.

## 2024-03-25 NOTE — Telephone Encounter (Signed)
 Copied from CRM [id number]. Topic: General - Other >> Mar 25, 2024  8:25 AM Daved SQUIBB wrote: Reason for CRM: Tonya nurse with [facility] home health [phone] needing verbal orders for patient, nursing 1 week for cardiac teaching and medications. Number to call back for approval, [phone]

## 2024-03-25 NOTE — Progress Notes (Signed)
 Advanced Heart Failure Clinic Note  PCP: Delbert Clam, MD HF Cardiologist:   Chief Complaint: HFrEF  HPI: 68 y.o. AA male with history of prostate cancer s/p prostate cryoablation 08/25, HTN. Recently diagnosed CAD and ischemic cardiomyopathy.  He presented 03/15/24 with out of witnessed out of hospital cardiac arrest while shopping at [employer]. Received 15 minutes of CPR and had shockable rhythm (VF). Post arrest had inferolateral STE on ECG. 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 Also had 80% small PDA treated medically. Echo with LVEF 40%, HK inferior/inferolateral walls, RV okay  He is here today for post hospital CHF follow-up.  He has been doing well. No chest pain, shortness of breath, orthopnea, PND or lower extremity edema. He is eager to return to the gym. Prior to his MI he exercised on the elliptical or walked on the treadmill 5-6 days a week and lifted weights. He is taking all medications as prescribed. No ETOH or tobacco use.    Past Medical History:  Diagnosis Date   BPH (benign prostatic hyperplasia)    Hx of radiation therapy 09/06/10 - 10/29/10   prostate, seminal vesicles   Hypertension    on meds   Prostate cancer (HCC) 04/26/2010   gleason 7, vol 20.88 cc    Current Outpatient Medications  Medication Sig Dispense Refill   Brimonidine  Tartrate (LUMIFY  OP) Place 1 drop into both eyes daily.     magnesium  oxide (MAG-OX) 400 (240 Mg) MG tablet Take 1 tablet (400 mg total) by mouth daily for 10 days. 10 tablet 0   potassium chloride  SA (KLOR-CON  M) 20 MEQ tablet Take 1 tablet (20 mEq total) by mouth daily for 10 days. 10 tablet 0   senna-docusate (SENOKOT-S) 8.6-50 MG tablet Take 1 tablet by mouth 2 (two) times daily. While taking pain meds to prevent constipation 10 tablet 0   tamsulosin  (FLOMAX ) 0.4 MG CAPS capsule Take 1 capsule (0.4 mg total) by mouth 2 (two) times daily. 60 capsule 11   XTANDI  80 MG tablet Take 80 mg by mouth daily.     aspirin  81 MG chewable tablet Chew 1 tablet (81 mg total) by mouth daily. 90 tablet 2   atorvastatin  (LIPITOR ) 80 MG tablet Take 1 tablet (80 mg total) by mouth daily. 90 tablet 2   clopidogrel  (PLAVIX ) 75 MG tablet Take 1 tablet (75 mg total) by mouth daily. 90 tablet 2   ENTRESTO  97-103 MG Take 1 tablet by mouth 2 (two) times daily. 90 tablet 2   metoprolol  succinate (TOPROL -XL) 25 MG 24 hr tablet Take 1 tablet (25 mg total) by mouth daily. 90 tablet 2   spironolactone  (ALDACTONE ) 25 MG tablet Take 1 tablet (25 mg total) by mouth daily. 90 tablet 2   No current facility-administered medications for this encounter.    No Known Allergies    Social History   Socioeconomic History   Marital status: Single    Spouse name: Not on file   Number of children: Not on file   Years of education: Not on file   Highest education level: Not on file  Occupational History   Not on file  Tobacco Use   Smoking status: Never   Smokeless tobacco: Never  Vaping Use   Vaping status: Never Used  Substance and Sexual Activity   Alcohol use: Never    Alcohol/week: 7.0 standard drinks of alcohol    Types: 7 Standard drinks or equivalent per  week   Drug use: No   Sexual activity: Not on file  Other Topics Concern   Not on file  Social History Narrative   Not on file   Social Drivers of Health   Financial Resource Strain: Not on file  Food Insecurity: No Food Insecurity (03/17/2024)   Hunger Vital Sign    Worried About Running Out of Food in the Last Year: Never true    Ran Out of Food in the Last Year: Never true  Transportation Needs: No Transportation Needs (03/17/2024)   PRAPARE - Administrator, Civil Service (Medical): No    Lack of Transportation (Non-Medical): No  Physical Activity: Not on file  Stress: Not on file  Social Connections: Unknown (03/17/2024)   Social Connection and Isolation Panel    Frequency of Communication with  Friends and Family: Patient declined    Frequency of Social Gatherings with Friends and Family: Patient declined    Attends Religious Services: Patient declined    Database administrator or Organizations: Not on file    Attends Banker Meetings: Not on file    Marital Status: Patient declined  Intimate Partner Violence: Not At Risk (03/17/2024)   Humiliation, Afraid, Rape, and Kick questionnaire    Fear of Current or Ex-Partner: No    Emotionally Abused: No    Physically Abused: No    Sexually Abused: No      Family History  Problem Relation Age of Onset   Stroke Mother    Colon cancer Neg Hx    Colon polyps Neg Hx    Esophageal cancer Neg Hx    Stomach cancer Neg Hx    Rectal cancer Neg Hx     Vitals:   03/25/24 1317  BP: (!) 154/76  Pulse: 61  SpO2: 99%  Weight: 77.4 kg (170 lb 9.6 oz)  Height: 5' 6 (1.676 m)     PHYSICAL EXAM: General:  Well appearing.  Cor: No JVD. Regular rate & rhythm. No murmurs. Lungs: clear Abdomen: soft, nontender, nondistended.  Extremities: no edema Neuro: alert & oriented x 3. Affect pleasant.   ASSESSMENT & PLAN:  1. OOH VF Cardiac Arrest  - Witnessed VF arrest 08/25, CPR initiated ~1 min after found down, total ~15 min CPR, 6 defibs, epi and amio  - post arrest EKG w/ anterolateral STE. Emergent cath w/ right PLA occlusion (culprit). S/p PCI  - Echo EF 40%   2. CAD  - Recent inferolateral STEMI 08/25. Culprit on cath with occlusion of large RPLA, s/p PCI/DES. Had 80% small RPDA treated medically - Continue DAPT with ASA + Plavix  x 1 year (Brilinta  changed to Plavix  due to interaction with prostate meds) - Continue high-intensity statin, check lipid panel in about 6 weeks. Goal LDL < 55. - No chest pain - Encouraged him to participate in cardiac rehab   3. HFrEF, ICM - Echo 08/25 EF 40%, RWMA, RV okay - Cath in 08/25 as above - NYHA I-II - Volume looks good. Not requiring loop diuretic. - Stop Losartan . Start  Entresto  97/103 mg BID. Copay $0. - Continue Toprol  XL 25 mg daily  - Continue spironolactone  25 mg daily  - No longer has a foley. Not having issues with urinary retention. Can consider farxiga next visit, copay would be $0. - BMET/BNP today - plan repeat echo in 3 months to reassess LV function/EF  4. HTN - BP elevated today - Med changes as above   5.  Prostate Cancer  - s/p recent prostate cryoablation on 03/06/24   Follow-up 2 weeks with APP for medication titration  Anntoinette Haefele N, PA-C 03/25/24

## 2024-03-25 NOTE — Telephone Encounter (Signed)
 Advanced Heart Failure Patient Advocate Encounter  Test billing for this patients current coverage ([employer] MPD) shows $0 copay for 90 day supply of generic Farxiga, and $0 copay for brand name Entresto  with a limit of 30 day supply.  This test claim was processed through [facility]- copay amounts may vary at other pharmacies due to pharmacy/plan contracts, or as the patient moves through the different stages of their insurance plan.  Rachel DEL, CPhT Rx Patient Advocate Phone: [phone]

## 2024-03-25 NOTE — Patient Instructions (Addendum)
 Good to see you today!  Stop Taking Losartan .   Start taking Entresto  97/103 mg tablet twice a day.  Labs done today, your results will be available in MyChart, we will contact you for abnormal readings.  Your physician recommends that you schedule a follow-up appointment 2 weeks with app clinic  If you have any questions or concerns before your next appointment please send us  a message through [place] or call our office at [phone].    TO LEAVE A MESSAGE FOR THE NURSE SELECT OPTION 2, PLEASE LEAVE A MESSAGE INCLUDING: YOUR NAME DATE OF BIRTH CALL BACK NUMBER REASON FOR CALL**this is important as we prioritize the call backs  YOU WILL RECEIVE A CALL BACK THE SAME DAY AS LONG AS YOU CALL BEFORE 4:00 PM At the Advanced [facility] Failure Clinic, you and your health needs are our priority. As part of our continuing mission to provide you with exceptional heart care, we have created designated Provider Care Teams. These Care Teams include your primary Cardiologist (physician) and Advanced Practice Providers (APPs- Physician Assistants and Nurse Practitioners) who all work together to provide you with the care you need, when you need it.   You may see any of the following providers on your designated Care Team at your next follow up: Dr Toribio Fuel Dr Ezra Shuck Dr. Ria Commander Dr. Morene Brownie Amy Lenetta, NP Caffie Shed, [place] [facility] [place], [place] Beckey Coe, NP [place] Lee, NP Ellouise Class, NP Tinnie Redman, PharmD Jaun Bash, PharmD   Please be sure to bring in all your medications bottles to every appointment.    Thank you for choosing [facility]

## 2024-03-26 ENCOUNTER — Telehealth (HOSPITAL_COMMUNITY): Payer: Self-pay

## 2024-03-26 DIAGNOSIS — R3914 Feeling of incomplete bladder emptying: Secondary | ICD-10-CM | POA: Diagnosis not present

## 2024-03-26 NOTE — Telephone Encounter (Signed)
 Called patient to see if he was interested in participating in the Cardiac Rehab Program. Patient will come in for orientation on 9/18 and will attend the 6:45 exercise class on Mondays and Fridays.  Pensions consultant.

## 2024-03-26 NOTE — Telephone Encounter (Signed)
 Pt insurance is active and benefits verified through [employer] [facility] [employer]. Co-pay $0, DED $257/$257 met, out of pocket $9,350/$565.97 met, co-insurance 0%. No pre-authorization required. Passport, 03/26/2024 @ 12:37pm, REF# [id number].  TCR/ICR? ICR Visit(date of service)limitation? No Can multiple codes be used on the same date of service/visit?(IF ITS A LIMIT) N/A  Is this a lifetime maximum or an annual maximum? Annual Has the member used any of these services to date? No Is there a time limit (weeks/months) on start of program and/or program completion? No

## 2024-03-27 ENCOUNTER — Other Ambulatory Visit: Payer: Self-pay

## 2024-03-27 ENCOUNTER — Other Ambulatory Visit: Payer: Self-pay | Admitting: Urology

## 2024-03-27 DIAGNOSIS — Z48812 Encounter for surgical aftercare following surgery on the circulatory system: Secondary | ICD-10-CM | POA: Diagnosis not present

## 2024-03-27 DIAGNOSIS — Z955 Presence of coronary angioplasty implant and graft: Secondary | ICD-10-CM | POA: Diagnosis not present

## 2024-03-27 DIAGNOSIS — I081 Rheumatic disorders of both mitral and tricuspid valves: Secondary | ICD-10-CM | POA: Diagnosis not present

## 2024-03-27 DIAGNOSIS — S2231XD Fracture of one rib, right side, subsequent encounter for fracture with routine healing: Secondary | ICD-10-CM | POA: Diagnosis not present

## 2024-03-27 DIAGNOSIS — I255 Ischemic cardiomyopathy: Secondary | ICD-10-CM | POA: Diagnosis not present

## 2024-03-27 DIAGNOSIS — I5020 Unspecified systolic (congestive) heart failure: Secondary | ICD-10-CM | POA: Diagnosis not present

## 2024-03-27 DIAGNOSIS — Z7902 Long term (current) use of antithrombotics/antiplatelets: Secondary | ICD-10-CM | POA: Diagnosis not present

## 2024-03-27 DIAGNOSIS — I110 Hypertensive heart disease with heart failure: Secondary | ICD-10-CM | POA: Diagnosis not present

## 2024-03-27 DIAGNOSIS — I2119 ST elevation (STEMI) myocardial infarction involving other coronary artery of inferior wall: Secondary | ICD-10-CM | POA: Diagnosis not present

## 2024-03-27 DIAGNOSIS — Z9181 History of falling: Secondary | ICD-10-CM | POA: Diagnosis not present

## 2024-03-27 DIAGNOSIS — E876 Hypokalemia: Secondary | ICD-10-CM | POA: Diagnosis not present

## 2024-03-27 DIAGNOSIS — Z8674 Personal history of sudden cardiac arrest: Secondary | ICD-10-CM | POA: Diagnosis not present

## 2024-03-27 DIAGNOSIS — Z7982 Long term (current) use of aspirin: Secondary | ICD-10-CM | POA: Diagnosis not present

## 2024-03-27 DIAGNOSIS — J9601 Acute respiratory failure with hypoxia: Secondary | ICD-10-CM | POA: Diagnosis not present

## 2024-03-28 ENCOUNTER — Other Ambulatory Visit: Payer: Self-pay

## 2024-03-29 ENCOUNTER — Telehealth: Payer: Self-pay | Admitting: Family Medicine

## 2024-03-29 ENCOUNTER — Other Ambulatory Visit: Payer: Self-pay

## 2024-03-29 NOTE — Telephone Encounter (Signed)
 Copied from CRM #[id number]. Topic: General - Other >> Mar 29, 2024  9:08 AM Dawna HERO wrote:  Reason for CRM: Tonya from [employer] is calling to see how patient fractured his rib, says it is for coding and billing. Her call back number is [phone].

## 2024-04-02 NOTE — Telephone Encounter (Unsigned)
 Copied from CRM [id number]. Topic: Clinical - Medical Advice >> Apr 02, 2024  8:34 AM Carlatta H wrote: Reason for CRM: Please call to advise Tonya from [employer] is calling to see how patient fractured his rib, says it is for coding and billing. Her call back number is [phone].

## 2024-04-02 NOTE — Telephone Encounter (Signed)
 Attempted to contact and no answer. I do not have the information she is requesting.

## 2024-04-04 DIAGNOSIS — Z9181 History of falling: Secondary | ICD-10-CM | POA: Diagnosis not present

## 2024-04-04 DIAGNOSIS — I255 Ischemic cardiomyopathy: Secondary | ICD-10-CM | POA: Diagnosis not present

## 2024-04-04 DIAGNOSIS — I5020 Unspecified systolic (congestive) heart failure: Secondary | ICD-10-CM | POA: Diagnosis not present

## 2024-04-04 DIAGNOSIS — E876 Hypokalemia: Secondary | ICD-10-CM | POA: Diagnosis not present

## 2024-04-04 DIAGNOSIS — S2231XD Fracture of one rib, right side, subsequent encounter for fracture with routine healing: Secondary | ICD-10-CM | POA: Diagnosis not present

## 2024-04-04 DIAGNOSIS — Z8674 Personal history of sudden cardiac arrest: Secondary | ICD-10-CM | POA: Diagnosis not present

## 2024-04-04 DIAGNOSIS — Z7982 Long term (current) use of aspirin: Secondary | ICD-10-CM | POA: Diagnosis not present

## 2024-04-04 DIAGNOSIS — J9601 Acute respiratory failure with hypoxia: Secondary | ICD-10-CM | POA: Diagnosis not present

## 2024-04-04 DIAGNOSIS — I110 Hypertensive heart disease with heart failure: Secondary | ICD-10-CM | POA: Diagnosis not present

## 2024-04-04 DIAGNOSIS — I2119 ST elevation (STEMI) myocardial infarction involving other coronary artery of inferior wall: Secondary | ICD-10-CM | POA: Diagnosis not present

## 2024-04-04 DIAGNOSIS — I081 Rheumatic disorders of both mitral and tricuspid valves: Secondary | ICD-10-CM | POA: Diagnosis not present

## 2024-04-04 DIAGNOSIS — Z48812 Encounter for surgical aftercare following surgery on the circulatory system: Secondary | ICD-10-CM | POA: Diagnosis not present

## 2024-04-04 DIAGNOSIS — Z7902 Long term (current) use of antithrombotics/antiplatelets: Secondary | ICD-10-CM | POA: Diagnosis not present

## 2024-04-04 DIAGNOSIS — Z955 Presence of coronary angioplasty implant and graft: Secondary | ICD-10-CM | POA: Diagnosis not present

## 2024-04-05 ENCOUNTER — Telehealth (HOSPITAL_COMMUNITY): Payer: Self-pay

## 2024-04-05 NOTE — Progress Notes (Addendum)
 Advanced Heart Failure Clinic Note  PCP: Delbert Clam, MD HF Cardiologist: Dr. Cherrie  HPI: 68 y.o. AA male with history of prostate cancer s/p prostate cryoablation 08/25, HTN. Recently diagnosed CAD and ischemic cardiomyopathy.  Admitted 03/15/24 with witnessed, out of hospital, cardiac arrest while shopping at [employer]. Received 15 minutes of CPR and had shockable rhythm (VF). Post arrest had inferolateral STE on ECG. 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 Also had 80% small PDA treated medically. Echo with LVEF 40%, HK inferior/inferolateral walls, RV okay.  Today he returns for HF follow up. Overall feeling fine. Main complaint is rash to + left upper chest and in waistband. He walks outside daily, 2-5 miles on flat ground, without dyspnea. Denies palpitations, abnormal bleeding, CP, dizziness, edema, or PND/Orthopnea. Appetite ok. Weight at home 170 pounds. Taking all medications. No ETOH, drugs or tobacco. He is illiterate, niece and brother help him with his medications. He has transportation and is insured.   Cardiac Studies  - Echo 8/25: EF 40%, mild LVH, normal RV  - LHC 8/25: PTCA to RPLA   Past Medical History:  Diagnosis Date   BPH (benign prostatic hyperplasia)    Hx of radiation therapy 09/06/10 - 10/29/10   prostate, seminal vesicles   Hypertension    on meds   Prostate cancer (HCC) 04/26/2010   gleason 7, vol 20.88 cc   Current Outpatient Medications  Medication Sig Dispense Refill   aspirin  81 MG chewable tablet Chew 1 tablet (81 mg total) by mouth daily. 90 tablet 2   atorvastatin  (LIPITOR ) 80 MG tablet Take 1 tablet (80 mg total) by mouth daily. 90 tablet 2   Brimonidine  Tartrate (LUMIFY  OP) Place 1 drop into both eyes daily.     clopidogrel  (PLAVIX ) 75 MG tablet Take 1 tablet (75 mg total) by mouth daily. 90 tablet 2   ENTRESTO  97-103 MG Take 1 tablet by mouth 2 (two) times daily. 90 tablet  2   metoprolol  succinate (TOPROL -XL) 25 MG 24 hr tablet Take 1 tablet (25 mg total) by mouth daily. 90 tablet 2   potassium chloride  SA (KLOR-CON  M) 20 MEQ tablet Take 1 tablet (20 mEq total) by mouth daily for 10 days. 10 tablet 0   senna-docusate (SENOKOT-S) 8.6-50 MG tablet Take 1 tablet by mouth 2 (two) times daily. While taking pain meds to prevent constipation 10 tablet 0   spironolactone  (ALDACTONE ) 25 MG tablet Take 1 tablet (25 mg total) by mouth daily. 90 tablet 2   tamsulosin  (FLOMAX ) 0.4 MG CAPS capsule Take 1 capsule (0.4 mg total) by mouth 2 (two) times daily. 60 capsule 11   XTANDI 80 MG tablet Take 80 mg by mouth daily.     No current facility-administered medications for this encounter.   No Known Allergies  Social History   Socioeconomic History   Marital status: Single    Spouse name: Not on file   Number of children: Not on file   Years of education: Not on file   Highest education level: Not on file  Occupational History   Not on file  Tobacco Use   Smoking status: Never   Smokeless tobacco: Never  Vaping Use   Vaping status: Never Used  Substance and Sexual Activity   Alcohol use: Never    Alcohol/week: 7.0 standard drinks of alcohol    Types: 7 Standard drinks or equivalent per week   Drug use: No   Sexual  activity: Not on file  Other Topics Concern   Not on file  Social History Narrative   Not on file   Social Drivers of Health   Financial Resource Strain: Not on file  Food Insecurity: No Food Insecurity (03/17/2024)   Hunger Vital Sign    Worried About Running Out of Food in the Last Year: Never true    Ran Out of Food in the Last Year: Never true  Transportation Needs: No Transportation Needs (03/17/2024)   PRAPARE - Administrator, Civil Service (Medical): No    Lack of Transportation (Non-Medical): No  Physical Activity: Not on file  Stress: Not on file  Social Connections: Unknown (03/17/2024)   Social Connection and Isolation  Panel    Frequency of Communication with Friends and Family: Patient declined    Frequency of Social Gatherings with Friends and Family: Patient declined    Attends Religious Services: Patient declined    Database administrator or Organizations: Not on file    Attends Banker Meetings: Not on file    Marital Status: Patient declined  Intimate Partner Violence: Not At Risk (03/17/2024)   Humiliation, Afraid, Rape, and Kick questionnaire    Fear of Current or Ex-Partner: No    Emotionally Abused: No    Physically Abused: No    Sexually Abused: No    Family History  Problem Relation Age of Onset   Stroke Mother    Colon cancer Neg Hx    Colon polyps Neg Hx    Esophageal cancer Neg Hx    Stomach cancer Neg Hx    Rectal cancer Neg Hx    Wt Readings from Last 3 Encounters:  04/08/24 78.7 kg (173 lb 9.6 oz)  03/25/24 77.4 kg (170 lb 9.6 oz)  03/17/24 77.7 kg (171 lb 3.2 oz)   BP 122/74   Pulse 76   Ht 5' 6 (1.676 m)   Wt 78.7 kg (173 lb 9.6 oz)   SpO2 98%   BMI 28.02 kg/m   PHYSICAL EXAM: General:  NAD. No resp difficulty, walked into clinic HEENT: Normal Neck: Supple. No JVD. Cor: Regular rate & rhythm. No rubs, gallops or murmurs. Lungs: Clear Abdomen: Soft, nontender, nondistended.  Extremities: No cyanosis, clubbing, rash, edema Neuro: Alert & oriented x 3, moves all 4 extremities w/o difficulty. Affect pleasant. Skin: Eczematous areas, non draining, to left upper chest and periumbilical area   ASSESSMENT & PLAN:  1. OOH VF Cardiac Arrest  - Witnessed VF arrest 08/25, CPR initiated ~1 min after found down, total ~15 min CPR, 6 defibs, epi and amio  - post arrest EKG w/ anterolateral STE. Emergent cath w/ right PLA occlusion (culprit). S/p PCI  - Echo EF 40%   2. CAD  - Inferolateral STEMI 08/25. Culprit on cath with occlusion of large RPLA, s/p PCI/DES. Had 80% small RPDA treated medically - Continue DAPT with ASA + Plavix  x 1 year (Brilinta  changed  to Plavix  due to interaction with prostate meds) - Continue high-intensity statin, check lipid panel next visit. Goal LDL < 55. - No chest pain - He is planning to start cardiac rehab   3. HFrEF, ICM - Echo 8/25 EF 40%, RWMA, RV okay - Cath in 08/25 as above - NYHA I-II, volume OK. Does not need daily loop - Start Farxiga  10 mg daily. Disused potential side effects - Continue Entresto  97/103 mg bid - Continue Toprol  XL 25 mg daily  - Continue spironolactone   25 mg daily  - BMET/BNP today - Repeat echo in 2-3 months.  4. HTN - BP stable today - Meds as above   5. Prostate Cancer  - s/p recent prostate cryoablation on 03/06/24  6. Rash - Looks like an allergic dermitis - Discussed switching to unscented detergent, body wash and deodorant - Can try OTC 1% hydrocortisone cream, AAA bid x 2 weeks  Follow up in 2-3 months with Dr. Cherrie + echo  Jeffery Graves, [place] 04/08/24

## 2024-04-05 NOTE — Telephone Encounter (Signed)
 Called to confirm/remind patient of their appointment at the [facility] Clinic on 04/08/24.   Appointment:   [x] Confirmed  [] Left mess   [] No answer/No voice mail  [] VM Full/unable to leave message  [] Phone not in service  Patient reminded to bring all medications and/or complete list.  Confirmed patient has transportation. Gave directions, instructed to utilize valet parking.

## 2024-04-08 ENCOUNTER — Encounter (HOSPITAL_COMMUNITY): Payer: Self-pay

## 2024-04-08 ENCOUNTER — Ambulatory Visit (HOSPITAL_COMMUNITY): Payer: Self-pay | Admitting: Family Medicine

## 2024-04-08 ENCOUNTER — Ambulatory Visit (HOSPITAL_COMMUNITY)
Admission: RE | Admit: 2024-04-08 | Discharge: 2024-04-08 | Disposition: A | Discharge status: Home / Self Care | Source: Ambulatory Visit | Attending: Family Medicine | Admitting: Family Medicine

## 2024-04-08 ENCOUNTER — Telehealth (HOSPITAL_COMMUNITY): Payer: Self-pay

## 2024-04-08 ENCOUNTER — Other Ambulatory Visit: Payer: Self-pay

## 2024-04-08 ENCOUNTER — Other Ambulatory Visit (HOSPITAL_COMMUNITY): Payer: Self-pay

## 2024-04-08 VITALS — BP 122/74 | HR 76 | Ht 66.0 in | Wt 173.6 lb

## 2024-04-08 DIAGNOSIS — I2119 ST elevation (STEMI) myocardial infarction involving other coronary artery of inferior wall: Secondary | ICD-10-CM | POA: Insufficient documentation

## 2024-04-08 DIAGNOSIS — Z8674 Personal history of sudden cardiac arrest: Secondary | ICD-10-CM | POA: Diagnosis not present

## 2024-04-08 DIAGNOSIS — Z955 Presence of coronary angioplasty implant and graft: Secondary | ICD-10-CM | POA: Diagnosis not present

## 2024-04-08 DIAGNOSIS — I5022 Chronic systolic (congestive) heart failure: Secondary | ICD-10-CM | POA: Insufficient documentation

## 2024-04-08 DIAGNOSIS — R21 Rash and other nonspecific skin eruption: Secondary | ICD-10-CM | POA: Insufficient documentation

## 2024-04-08 DIAGNOSIS — Z7902 Long term (current) use of antithrombotics/antiplatelets: Secondary | ICD-10-CM | POA: Insufficient documentation

## 2024-04-08 DIAGNOSIS — I2510 Atherosclerotic heart disease of native coronary artery without angina pectoris: Secondary | ICD-10-CM | POA: Diagnosis not present

## 2024-04-08 DIAGNOSIS — Z7982 Long term (current) use of aspirin: Secondary | ICD-10-CM | POA: Insufficient documentation

## 2024-04-08 DIAGNOSIS — I255 Ischemic cardiomyopathy: Secondary | ICD-10-CM | POA: Diagnosis not present

## 2024-04-08 DIAGNOSIS — I10 Essential (primary) hypertension: Secondary | ICD-10-CM

## 2024-04-08 DIAGNOSIS — Z79899 Other long term (current) drug therapy: Secondary | ICD-10-CM | POA: Insufficient documentation

## 2024-04-08 DIAGNOSIS — I4901 Ventricular fibrillation: Secondary | ICD-10-CM | POA: Diagnosis not present

## 2024-04-08 DIAGNOSIS — I110 Hypertensive heart disease with heart failure: Secondary | ICD-10-CM | POA: Diagnosis not present

## 2024-04-08 DIAGNOSIS — Z8546 Personal history of malignant neoplasm of prostate: Secondary | ICD-10-CM | POA: Insufficient documentation

## 2024-04-08 LAB — BASIC METABOLIC PANEL WITH GFR
Anion gap: 9 (ref 5–15)
BUN: 12 mg/dL (ref 8–23)
CO2: 24 mmol/L (ref 22–32)
Calcium: 8.3 mg/dL — ABNORMAL LOW (ref 8.9–10.3)
Chloride: 105 mmol/L (ref 98–111)
Creatinine, Ser: 0.97 mg/dL (ref 0.61–1.24)
GFR, Estimated: >60 mL/min (ref >60)
Glucose, Bld: 79 mg/dL (ref 70–99)
Potassium: 3.4 mmol/L — ABNORMAL LOW (ref 3.5–5.1)
Sodium: 138 mmol/L (ref 135–145)

## 2024-04-08 LAB — BRAIN NATRIURETIC PEPTIDE: B Natriuretic Peptide: 290.5 pg/mL — ABNORMAL HIGH (ref 0.0–100.0)

## 2024-04-08 MED ORDER — DAPAGLIFLOZIN PROPANEDIOL 10 MG PO TABS
10.0000 mg | ORAL_TABLET | Freq: Every day | ORAL | 6 refills | Status: AC
  Filled 2024-04-08: qty 30, 30d supply, fill #0
  Filled 2024-05-01: qty 30, 30d supply, fill #1
  Filled 2024-05-30: qty 30, 30d supply, fill #2
  Filled 2024-06-28: qty 30, 30d supply, fill #3
  Filled 2024-07-24 (×2): qty 30, 30d supply, fill #4
  Filled 2024-08-23: qty 30, 30d supply, fill #5

## 2024-04-08 NOTE — Telephone Encounter (Signed)
 Advanced Heart Failure Patient Advocate Encounter  Test billing for this patient's current coverage ([employer] MPD) returns a $0 copay for 90 day supply of Farxiga .  This test claim was processed through [facility]- copay amounts may vary at other pharmacies due to pharmacy/plan contracts, or as the patient moves through the different stages of their insurance plan.  Rachel DEL, CPhT Rx Patient Advocate Phone: [phone]

## 2024-04-08 NOTE — Patient Instructions (Signed)
 Start Farxiga  10 mg daily - Rx sent. May apply over the counter 1% hydrocortisone cream twice daily to rash. Do not use for more than 2 weeks for rash. Please follow up with your primary care physician for your rash. Labs today - will call you if abnormal.  Return to see Dr. Cherrie in 2 months with echo. See below. Please call [phone] if any questions or concerns prior to your next visit.

## 2024-04-10 DIAGNOSIS — Z7902 Long term (current) use of antithrombotics/antiplatelets: Secondary | ICD-10-CM | POA: Diagnosis not present

## 2024-04-10 DIAGNOSIS — Z9181 History of falling: Secondary | ICD-10-CM | POA: Diagnosis not present

## 2024-04-10 DIAGNOSIS — S2231XD Fracture of one rib, right side, subsequent encounter for fracture with routine healing: Secondary | ICD-10-CM | POA: Diagnosis not present

## 2024-04-10 DIAGNOSIS — Z7982 Long term (current) use of aspirin: Secondary | ICD-10-CM | POA: Diagnosis not present

## 2024-04-10 DIAGNOSIS — I255 Ischemic cardiomyopathy: Secondary | ICD-10-CM | POA: Diagnosis not present

## 2024-04-10 DIAGNOSIS — I081 Rheumatic disorders of both mitral and tricuspid valves: Secondary | ICD-10-CM | POA: Diagnosis not present

## 2024-04-10 DIAGNOSIS — Z48812 Encounter for surgical aftercare following surgery on the circulatory system: Secondary | ICD-10-CM | POA: Diagnosis not present

## 2024-04-10 DIAGNOSIS — Z955 Presence of coronary angioplasty implant and graft: Secondary | ICD-10-CM | POA: Diagnosis not present

## 2024-04-10 DIAGNOSIS — I110 Hypertensive heart disease with heart failure: Secondary | ICD-10-CM | POA: Diagnosis not present

## 2024-04-10 DIAGNOSIS — I2119 ST elevation (STEMI) myocardial infarction involving other coronary artery of inferior wall: Secondary | ICD-10-CM | POA: Diagnosis not present

## 2024-04-10 DIAGNOSIS — E876 Hypokalemia: Secondary | ICD-10-CM | POA: Diagnosis not present

## 2024-04-10 DIAGNOSIS — Z8674 Personal history of sudden cardiac arrest: Secondary | ICD-10-CM | POA: Diagnosis not present

## 2024-04-10 DIAGNOSIS — I5020 Unspecified systolic (congestive) heart failure: Secondary | ICD-10-CM | POA: Diagnosis not present

## 2024-04-10 DIAGNOSIS — J9601 Acute respiratory failure with hypoxia: Secondary | ICD-10-CM | POA: Diagnosis not present

## 2024-04-16 ENCOUNTER — Encounter: Payer: Self-pay | Admitting: Nurse Practitioner

## 2024-04-16 ENCOUNTER — Telehealth (HOSPITAL_COMMUNITY): Payer: Self-pay

## 2024-04-16 ENCOUNTER — Ambulatory Visit: Attending: Nurse Practitioner | Admitting: Nurse Practitioner

## 2024-04-16 VITALS — BP 127/75 | HR 57 | Resp 19 | Ht 66.0 in | Wt 170.8 lb

## 2024-04-16 DIAGNOSIS — I10 Essential (primary) hypertension: Secondary | ICD-10-CM | POA: Diagnosis not present

## 2024-04-16 DIAGNOSIS — Z09 Encounter for follow-up examination after completed treatment for conditions other than malignant neoplasm: Secondary | ICD-10-CM

## 2024-04-16 NOTE — Progress Notes (Signed)
 Assessment & Plan:  Jeffery Graves was seen today for hospitalization follow-up.  Diagnoses and all orders for this visit:  Primary hypertension Blood pressure is within normal limits. He has a an appointment with cardiac rehab this week. Recheck vital signs including heart rate  Hospital discharge follow-up Recent myocardial infarction Follow-up post-hospitalization. Cardiology has already evaluated .  Patient has been counseled on age-appropriate routine health concerns for screening and prevention. These are reviewed and up-to-date. Referrals have been placed accordingly. Immunizations are up-to-date or declined.    Subjective:   Chief Complaint  Patient presents with   Hospitalization Follow-up    History of Present Illness Jeffery Graves is a 68 year old male who presents for a hospital follow-up after a recent myocardial infarction. 68 y.o. male presents to office today for HFU. He is a patient of Jeffery Graves  68 y.o. AA male with history of prostate cancer s/p prostate cryoablation 08/25, HTN. Recently diagnosed CAD and ischemic cardiomyopathy.   He presented 03/15/24 with out of witnessed out of hospital cardiac arrest while shopping at [employer]. Received 15 minutes of CPR and had shockable rhythm (VF). Post arrest had inferolateral STE on ECG. 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 Also had 80% small PDA treated medically. Echo with LVEF 40%, HK inferior/inferolateral walls, RV okay  Prior to his MI he exercised on the elliptical or walked on the treadmill 5-6 days a week and lifted weights. He is taking all medications as prescribed. No ETOH or tobacco use.    He had an recent office visit with Cardiology on 04-08-2024. Instructed to continue Plavix  and ASA for 1 year, high intensity statin, Entresto , Toprol  XL, spironolactone .  He was started on Farxiga  10 mg daily and also instructed to try 1% hydrocortisone cream  over-the-counter for allergic dermatitis   He takes his heart and blood pressure medications in the morning. He reports that he has not been napping or taking any muscle relaxants. His heart rate was 71 at his last appointment in July and currently is in the high 50s with asymptomatic bradycardia.  He does not have his medications with him today.  I would like to verify how he is taking his Toprol -XL. BP Readings from Last 3 Encounters:  04/16/24 127/75  04/08/24 122/74  03/25/24 (!) 154/76     He has a rash on the side of his chest, which initially presented with itching. He suspects the rash may have been caused by his deodorant or detergent, so he washed all his belongings. The rash seems to have improved with the use of over-the-counter hydrocortisone cream.    Review of Systems  Constitutional:  Negative for fever, malaise/fatigue and weight loss.  HENT: Negative.  Negative for nosebleeds.   Eyes: Negative.  Negative for blurred vision, double vision and photophobia.  Respiratory: Negative.  Negative for cough and shortness of breath.   Cardiovascular: Negative.  Negative for chest pain, palpitations and leg swelling.  Gastrointestinal: Negative.  Negative for heartburn, nausea and vomiting.  Musculoskeletal: Negative.  Negative for myalgias.  Neurological: Negative.  Negative for dizziness, focal weakness, seizures and headaches.  Psychiatric/Behavioral: Negative.  Negative for suicidal ideas.     Past Medical History:  Diagnosis Date   BPH (benign prostatic hyperplasia)    Hx of radiation therapy 09/06/10 - 10/29/10   prostate, seminal vesicles   Hypertension    on meds   Prostate cancer (HCC) 04/26/2010  gleason 7, vol 20.88 cc    Past Surgical History:  Procedure Laterality Date   BIOPSY PROSTATE     CORONARY/GRAFT ACUTE MI REVASCULARIZATION N/A 03/15/2024   Procedure: Coronary/Graft Acute MI Revascularization;  Surgeon: Elmira Newman PARAS, MD;  Location: [facility] INVASIVE  [facility];  Service: Cardiovascular;  Laterality: N/A;   CRYOABLATION N/A 03/06/2024   Procedure: CRYOABLATION, PROSTATE, CYSTOSCOPY;  Surgeon: Alvaro Ricardo KATHEE Mickey., MD;  Location: [facility] OR[facility];  Service: Urology;  Laterality: N/A;    Family History  Problem Relation Age of Onset   Stroke Mother    Colon cancer Neg Hx    Colon polyps Neg Hx    Esophageal cancer Neg Hx    Stomach cancer Neg Hx    Rectal cancer Neg Hx     Social History Reviewed with no changes to be made today.   Outpatient Medications Prior to Visit  Medication Sig Dispense Refill   aspirin  81 MG chewable tablet Chew 1 tablet (81 mg total) by mouth daily. 90 tablet 2   atorvastatin  (LIPITOR ) 80 MG tablet Take 1 tablet (80 mg total) by mouth daily. 90 tablet 2   Brimonidine  Tartrate (LUMIFY  OP) Place 1 drop into both eyes daily.     clopidogrel  (PLAVIX ) 75 MG tablet Take 1 tablet (75 mg total) by mouth daily. 90 tablet 2   dapagliflozin  propanediol (FARXIGA ) 10 MG TABS tablet Take 1 tablet (10 mg total) by mouth daily before breakfast. 30 tablet 6   ENTRESTO  97-103 MG Take 1 tablet by mouth 2 (two) times daily. 90 tablet 2   metoprolol  succinate (TOPROL -XL) 25 MG 24 hr tablet Take 1 tablet (25 mg total) by mouth daily. 90 tablet 2   potassium chloride  SA (KLOR-CON  M) 20 MEQ tablet Take 1 tablet (20 mEq total) by mouth daily for 10 days. 10 tablet 0   senna-docusate (SENOKOT-S) 8.6-50 MG tablet Take 1 tablet by mouth 2 (two) times daily. While taking pain meds to prevent constipation 10 tablet 0   spironolactone  (ALDACTONE ) 25 MG tablet Take 1 tablet (25 mg total) by mouth daily. 90 tablet 2   tamsulosin  (FLOMAX ) 0.4 MG CAPS capsule Take 1 capsule (0.4 mg total) by mouth 2 (two) times daily. 60 capsule 11   XTANDI 80 MG tablet Take 80 mg by mouth daily.     No facility-administered medications prior to visit.    No Known Allergies     Objective:    BP 127/75 (BP Location: Left Arm, Patient Position: Sitting, Cuff Size:  Normal)   Pulse (!) 57   Resp 19   Ht 5' 6 (1.676 m)   Wt 170 lb 12.8 oz (77.5 kg)   SpO2 100%   BMI 27.57 kg/m  Wt Readings from Last 3 Encounters:  04/16/24 170 lb 12.8 oz (77.5 kg)  04/08/24 173 lb 9.6 oz (78.7 kg)  03/25/24 170 lb 9.6 oz (77.4 kg)    Physical Exam Vitals and nursing note reviewed.  Constitutional:      Appearance: He is well-developed.  HENT:     Head: Normocephalic and atraumatic.  Cardiovascular:     Rate and Rhythm: Regular rhythm. Bradycardia present.     Heart sounds: Normal heart sounds. No murmur heard.    No friction rub. No gallop.     Comments: Asymptomatic Pulmonary:     Effort: Pulmonary effort is normal. No tachypnea or respiratory distress.     Breath sounds: Normal breath sounds. No decreased breath sounds, wheezing, rhonchi  or rales.  Chest:     Chest wall: No tenderness.  Musculoskeletal:        General: Normal range of motion.     Cervical back: Normal range of motion.  Skin:    General: Skin is warm and dry.  Neurological:     Mental Status: He is alert and oriented to person, place, and time.     Coordination: Coordination normal.  Psychiatric:        Behavior: Behavior normal. Behavior is cooperative.        Thought Content: Thought content normal.        Judgment: Judgment normal.          Patient has been counseled extensively about nutrition and exercise as well as the importance of adherence with medications and regular follow-up. The patient was given clear instructions to go to ER or return to medical center if symptoms don't improve, worsen or new problems develop. The patient verbalized understanding.   Follow-up: Return in about 2 months (around 06/27/2024).   Haze LELON Servant, FNP-BC [facility] [place], [place] [phone]   04/16/2024, 6:52 PM

## 2024-04-16 NOTE — Telephone Encounter (Signed)
 Cardiac orientation appointment confirmed and cardiac risk assessment completed.

## 2024-04-17 ENCOUNTER — Other Ambulatory Visit: Payer: Self-pay

## 2024-04-17 ENCOUNTER — Ambulatory Visit: Payer: Self-pay

## 2024-04-17 DIAGNOSIS — I2119 ST elevation (STEMI) myocardial infarction involving other coronary artery of inferior wall: Secondary | ICD-10-CM | POA: Diagnosis not present

## 2024-04-17 DIAGNOSIS — J9601 Acute respiratory failure with hypoxia: Secondary | ICD-10-CM | POA: Diagnosis not present

## 2024-04-17 DIAGNOSIS — I5020 Unspecified systolic (congestive) heart failure: Secondary | ICD-10-CM | POA: Diagnosis not present

## 2024-04-17 DIAGNOSIS — Z9181 History of falling: Secondary | ICD-10-CM | POA: Diagnosis not present

## 2024-04-17 DIAGNOSIS — Z8674 Personal history of sudden cardiac arrest: Secondary | ICD-10-CM | POA: Diagnosis not present

## 2024-04-17 DIAGNOSIS — Z48812 Encounter for surgical aftercare following surgery on the circulatory system: Secondary | ICD-10-CM | POA: Diagnosis not present

## 2024-04-17 DIAGNOSIS — E876 Hypokalemia: Secondary | ICD-10-CM | POA: Diagnosis not present

## 2024-04-17 DIAGNOSIS — I255 Ischemic cardiomyopathy: Secondary | ICD-10-CM | POA: Diagnosis not present

## 2024-04-17 DIAGNOSIS — I081 Rheumatic disorders of both mitral and tricuspid valves: Secondary | ICD-10-CM | POA: Diagnosis not present

## 2024-04-17 DIAGNOSIS — Z955 Presence of coronary angioplasty implant and graft: Secondary | ICD-10-CM | POA: Diagnosis not present

## 2024-04-17 DIAGNOSIS — Z7902 Long term (current) use of antithrombotics/antiplatelets: Secondary | ICD-10-CM | POA: Diagnosis not present

## 2024-04-17 DIAGNOSIS — I110 Hypertensive heart disease with heart failure: Secondary | ICD-10-CM | POA: Diagnosis not present

## 2024-04-17 DIAGNOSIS — S2231XD Fracture of one rib, right side, subsequent encounter for fracture with routine healing: Secondary | ICD-10-CM | POA: Diagnosis not present

## 2024-04-17 DIAGNOSIS — Z7982 Long term (current) use of aspirin: Secondary | ICD-10-CM | POA: Diagnosis not present

## 2024-04-17 NOTE — Telephone Encounter (Signed)
 Call to Eulalio Sniff, RN, [facility], [phone]. Unable to reach . Message was to acknowledge that the office receive message in regards to patient being D/C for [facility]

## 2024-04-17 NOTE — Telephone Encounter (Signed)
 FYI Only or Action Required?: Action required by provider: update on patient condition.  Patient was last seen in primary care on 04/16/2024 by Theotis Haze ORN, NP.  Called Nurse Triage reporting Advice Only.  Symptoms began n/a.  Interventions attempted: Other: n/a.  Symptoms are: n/a.  Triage Disposition: Information or Advice Only Call  Patient/caregiver understands and will follow disposition?: Yes  Copied from CRM [id number]. Topic: General - Other >> Apr 17, 2024  2:29 PM Larissa S wrote: Reason for CRM: Angeol with [facility] home health requesting to speak with nurse regarding discharge from home health Reason for Disposition  [1] Other NON-URGENT information for PCP AND [2] does not require PCP response  Answer Assessment - Initial Assessment Questions 1. REASON FOR CALL or QUESTION: What is your reason for calling today? or How can I best     Patient is stable      No sign of acute distress, goals met       Independent     Discharged from home health  2. CALLER: Document the source of call. (e.g., laboratory staff, caregiver or patient).     Eulalio Sniff, RN, [facility], [phone]  Protocols used: PCP Call - No Triage-A-AH

## 2024-04-18 ENCOUNTER — Encounter (HOSPITAL_COMMUNITY)
Admission: RE | Admit: 2024-04-18 | Discharge: 2024-04-18 | Disposition: A | Discharge status: Home / Self Care | Source: Ambulatory Visit | Attending: Internal Medicine | Admitting: Internal Medicine

## 2024-04-18 ENCOUNTER — Other Ambulatory Visit: Payer: Self-pay

## 2024-04-18 ENCOUNTER — Encounter (HOSPITAL_COMMUNITY): Payer: Self-pay

## 2024-04-18 ENCOUNTER — Telehealth (HOSPITAL_COMMUNITY): Payer: Self-pay | Admitting: *Deleted

## 2024-04-18 VITALS — BP 102/62 | HR 70 | Ht 66.75 in | Wt 169.1 lb

## 2024-04-18 DIAGNOSIS — I213 ST elevation (STEMI) myocardial infarction of unspecified site: Secondary | ICD-10-CM | POA: Insufficient documentation

## 2024-04-18 DIAGNOSIS — Z955 Presence of coronary angioplasty implant and graft: Secondary | ICD-10-CM | POA: Diagnosis not present

## 2024-04-18 HISTORY — DX: Atherosclerotic heart disease of native coronary artery without angina pectoris: I25.10

## 2024-04-18 NOTE — Progress Notes (Signed)
 Cardiac Rehab Medication Review by a Nurse  Does the patient  feel that his/her medications are working for him/her?  yes  Has the patient been experiencing any side effects to the medications prescribed?  no  Does the patient measure his/her own blood pressure or blood glucose at home?  no   Does the patient have any problems obtaining medications due to transportation or finances?   no  Understanding of regimen: poor Understanding of indications: fair Potential of compliance: good    Nurse comments: Dwyane is taking his medications as prescribed. Lemond brought his medication bottles in to verify. Patient says he did not know if he is  taking Kdur. Upon talking to [facility] RN Leevi only took [facility] for 10 days. Lemond does not have a prescription for Sublingual nitroglycerin . Will check with Dr Dewanda office. Thornton does not have a blood pressure cuff/ machine to check his BP at home.    Hadassah Gaw [facility] RN 04/18/2024 8:13 AM

## 2024-04-18 NOTE — Progress Notes (Signed)
 Cardiac Individual Treatment Plan  Patient Details  Name: Jeffery Graves MRN: [id number] Date of Birth: Apr 13, 1956 Referring Provider:   Flowsheet Row INTENSIVE CARDIAC REHAB ORIENT from 04/18/2024 in [facility]  Referring Provider Dr. 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  03/15/24 STEMI  Patient's Home Medications on Admission:  Current Outpatient Medications:    aspirin  81 MG chewable tablet, Chew 1 tablet (81 mg total) by mouth daily., Disp: 90 tablet, Rfl: 2   atorvastatin  (LIPITOR ) 80 MG tablet, Take 1 tablet (80 mg total) by mouth daily., Disp: 90 tablet, Rfl: 2   Brimonidine  Tartrate (LUMIFY  OP), Place 1 drop into both eyes daily., Disp: , Rfl:    clopidogrel  (PLAVIX ) 75 MG tablet, Take 1 tablet (75 mg total) by mouth daily., Disp: 90 tablet, Rfl: 2   dapagliflozin  propanediol (FARXIGA ) 10 MG TABS tablet, Take 1 tablet (10 mg total) by mouth daily before breakfast., Disp: 30 tablet, Rfl: 6   ENTRESTO  97-103 MG, Take 1 tablet by mouth 2 (two) times daily., Disp: 90 tablet, Rfl: 2   metoprolol  succinate (TOPROL -XL) 25 MG 24 hr tablet, Take 1 tablet (25 mg total) by mouth daily., Disp: 90 tablet, Rfl: 2   spironolactone  (ALDACTONE ) 25 MG tablet, Take 1 tablet (25 mg total) by mouth daily., Disp: 90 tablet, Rfl: 2   tamsulosin  (FLOMAX ) 0.4 MG CAPS capsule, Take 1 capsule (0.4 mg total) by mouth 2 (two) times daily., Disp: 60 capsule, Rfl: 11   XTANDI 80 MG tablet, Take 80 mg by mouth daily., Disp: , Rfl:    potassium chloride  SA (KLOR-CON  M) 20 MEQ tablet, Take 1 tablet (20 mEq total) by mouth daily for 10 days. (Patient not taking: Reported on 04/18/2024), Disp: 10 tablet, Rfl: 0   senna-docusate (SENOKOT-S) 8.6-50 MG tablet, Take 1  tablet by mouth 2 (two) times daily. While taking pain meds to prevent constipation (Patient not taking: Reported on 04/18/2024), Disp: 10 tablet, Rfl: 0  Past Medical History: Past Medical History:  Diagnosis Date   BPH (benign prostatic hyperplasia)    Coronary artery disease    Hx of radiation therapy 09/06/10 - 10/29/10   prostate, seminal vesicles   Hypertension    on meds   Prostate cancer (HCC) 04/26/2010   gleason 7, vol 20.88 cc    Tobacco Use: Social History   Tobacco Use  Smoking Status Never  Smokeless Tobacco Never    Labs: Review Flowsheet  More data exists      Latest Ref Rng & Units 06/21/2022 12/27/2022 02/12/2024 03/15/2024 03/16/2024  Labs for ITP Cardiac and Pulmonary Rehab  Cholestrol 0 - 200 mg/dL - 800  - 835  -  LDL (calc) 0 - 99 mg/dL - 897  - 895  -  HDL-C >40 mg/dL - 87  - 52  -  Trlycerides <150 mg/dL - 56  - 39  -  Hemoglobin A1c 4.8 - 5.6 % 5.1  - 5.4  5.2  5.2   PH, Arterial 7.35 - 7.45 - - - 7.371  -  PCO2 arterial 32 - 48 mmHg - - - 37.9  -  Bicarbonate 20.0 - 28.0 mmol/L - - - 21.9  -  TCO2 22 - 32 mmol/L - - -  23  -  Acid-base deficit 0.0 - 2.0 mmol/L - - - 3.0  -  O2 Saturation % - - - 100  -    Capillary Blood Glucose: Lab Results  Component Value Date   GLUCAP 100 (H) 03/19/2024   GLUCAP 103 (H) 03/18/2024   GLUCAP 95 03/18/2024   GLUCAP 104 (H) 03/18/2024   GLUCAP 111 (H) 03/18/2024     Exercise Target Goals: Exercise Program Goal: Individual exercise prescription set using results from initial 6 min walk test and THRR while considering  patient's activity barriers and safety.   Exercise Prescription Goal: Initial exercise prescription builds to 30-45 minutes a day of aerobic activity, 2-3 days per week.  Home exercise guidelines will be given to patient during program as part of exercise prescription that the participant will acknowledge.  Activity Barriers & Risk Stratification:  Activity Barriers & Cardiac Risk  Stratification - 04/18/24 1015       Activity Barriers & Cardiac Risk Stratification   Activity Barriers None    Cardiac Risk Stratification High          6 Minute Walk:  6 Minute Walk     Row Name 04/18/24 1007         6 Minute Walk   Phase Initial     Distance 1390 feet     Walk Time 6 minutes     # of Rest Breaks 0     MPH 2.63     METS 3.41     RPE 8     Perceived Dyspnea  0     VO2 Peak 11.94     Symptoms No     Resting HR 70 bpm     Resting BP 102/62     Resting Oxygen Saturation  100 %     Exercise Oxygen Saturation  during 6 min walk 99 %     Max Ex. HR 114 bpm     Max Ex. BP 126/76     2 Minute Post BP 112/72        Oxygen Initial Assessment:   Oxygen Re-Evaluation:   Oxygen Discharge (Final Oxygen Re-Evaluation):   Initial Exercise Prescription:  Initial Exercise Prescription - 04/18/24 1000       Date of Initial Exercise RX and Referring Provider   Date 04/18/24    Referring Provider Dr. Toribio Fuel, MD    Expected Discharge Date 07/12/24      Treadmill   MPH 2.5    Grade 0    Minutes 15    METs 2.91      Recumbant Elliptical   Level 2    RPM 60    Watts 80    Minutes 15    METs 3.2      Prescription Details   Frequency (times per week) 2    Duration Progress to 30 minutes of continuous aerobic without signs/symptoms of physical distress      Intensity   THRR 40-80% of Max Heartrate 62-123    Ratings of Perceived Exertion 11-13    Perceived Dyspnea 0-4      Progression   Progression Continue progressive overload as per policy without signs/symptoms or physical distress.      Resistance Training   Training Prescription Yes    Weight 3 lbs    Reps 10-15          Perform Capillary Blood Glucose checks as needed.  Exercise Prescription Changes:   Exercise Comments:   Exercise  Goals and Review:   Exercise Goals     Row Name 04/18/24 1018             Exercise Goals   Increase Physical Activity Yes        Intervention Provide advice, education, support and counseling about physical activity/exercise needs.;Develop an individualized exercise prescription for aerobic and resistive training based on initial evaluation findings, risk stratification, comorbidities and participant's personal goals.       Expected Outcomes Short Term: Attend rehab on a regular basis to increase amount of physical activity.;Long Term: Exercising regularly at least 3-5 days a week.;Long Term: Add in home exercise to make exercise part of routine and to increase amount of physical activity.       Increase Strength and Stamina Yes       Intervention Provide advice, education, support and counseling about physical activity/exercise needs.;Develop an individualized exercise prescription for aerobic and resistive training based on initial evaluation findings, risk stratification, comorbidities and participant's personal goals.       Expected Outcomes Short Term: Increase workloads from initial exercise prescription for resistance, speed, and METs.;Short Term: Perform resistance training exercises routinely during rehab and add in resistance training at home;Long Term: Improve cardiorespiratory fitness, muscular endurance and strength as measured by increased METs and functional capacity ( )       Able to understand and use rate of perceived exertion (RPE) scale Yes       Intervention Provide education and explanation on how to use RPE scale       Expected Outcomes Short Term: Able to use RPE daily in rehab to express subjective intensity level;Long Term:  Able to use RPE to guide intensity level when exercising independently       Knowledge and understanding of Target Heart Rate Range (THRR) Yes       Intervention Provide education and explanation of THRR including how the numbers were predicted and where they are located for reference       Expected Outcomes Short Term: Able to state/look up THRR;Long Term: Able to use THRR to  govern intensity when exercising independently;Short Term: Able to use daily as guideline for intensity in rehab       Understanding of Exercise Prescription Yes       Intervention Provide education, explanation, and written materials on patient's individual exercise prescription       Expected Outcomes Short Term: Able to explain program exercise prescription;Long Term: Able to explain home exercise prescription to exercise independently          Exercise Goals Re-Evaluation :   Discharge Exercise Prescription (Final Exercise Prescription Changes):   Nutrition:  Target Goals: Understanding of nutrition guidelines, daily intake of sodium 1500mg , cholesterol 200mg , calories 30% from fat and 7% or less from saturated fats, daily to have 5 or more servings of fruits and vegetables.  Biometrics:  Pre Biometrics - 04/18/24 1018       Pre Biometrics   Height 5' 6.75 (1.695 m)    Weight 76.7 kg    Waist Circumference 35 inches    Hip Circumference 38 inches    Waist to Hip Ratio 0.92 %    BMI (Calculated) 26.7    Triceps Skinfold 11 mm    % Body Fat 23.6 %    Grip Strength 15 kg    Flexibility 9 in    Single Leg Stand 24 seconds           Nutrition Therapy Plan and Nutrition  Goals:   Nutrition Assessments:  MEDIFICTS Score Key: >=70 Need to make dietary changes  40-70 Heart Healthy Diet <= 40 Therapeutic Level Cholesterol Diet    Picture Your Plate Scores: <59 Unhealthy dietary pattern with much room for improvement. 41-50 Dietary pattern unlikely to meet recommendations for good health and room for improvement. 51-60 More healthful dietary pattern, with some room for improvement.  >60 Healthy dietary pattern, although there may be some specific behaviors that could be improved.    Nutrition Goals Re-Evaluation:   Nutrition Goals Re-Evaluation:   Nutrition Goals Discharge (Final Nutrition Goals Re-Evaluation):   Psychosocial: Target Goals: Acknowledge  presence or absence of significant depression and/or stress, maximize coping skills, provide positive support system. Participant is able to verbalize types and ability to use techniques and skills needed for reducing stress and depression.  Initial Review & Psychosocial Screening:  Initial Psych Review & Screening - 04/18/24 1307       Initial Review   Current issues with None Identified      Family Dynamics   Good Support System? Yes   Lundon lives alone. Dalten has his neice Hadassah and his brother Lynwood for support     Barriers   Psychosocial barriers to participate in program There are no identifiable barriers or psychosocial needs.      Screening Interventions   Interventions Encouraged to exercise          Quality of Life Scores:  Quality of Life - 04/18/24 0859       Quality of Life   Select Quality of Life      Quality of Life Scores   Health/Function Pre 22.86 %    Socioeconomic Pre 19.58 %    Psych/Spiritual Pre 22.5 %    Family Pre 20.83 %    GLOBAL Pre 21.92 %         Scores of 19 and below usually indicate a poorer quality of life in these areas.  A difference of  2-3 points is a clinically meaningful difference.  A difference of 2-3 points in the total score of the Quality of Life Index has been associated with significant improvement in overall quality of life, self-image, physical symptoms, and general health in studies assessing change in quality of life.  PHQ-9: Review Flowsheet       04/18/2024 04/16/2024 06/22/2021 11/04/2015  Depression screen PHQ 2/9  Decreased Interest 0 0 0 0  Down, Depressed, Hopeless 0 0 0 0  PHQ - 2 Score 0 0 0 0  Altered sleeping 0 0 0 0  Tired, decreased energy 0 0 0 0  Change in appetite 0 0 0 0  Feeling bad or failure about yourself  0 0 0 0  Trouble concentrating 0 0 0 3  Moving slowly or fidgety/restless 0 0 0 0  Suicidal thoughts 0 0 0 0   PHQ-9 Score 0 0 0 3  Difficult doing work/chores Not difficult at all Not  difficult at all - -    Details       Data saved with a previous flowsheet row definition        Interpretation of Total Score  Total Score Depression Severity:  1-4 = Minimal depression, 5-9 = Mild depression, 10-14 = Moderate depression, 15-19 = Moderately severe depression, 20-27 = Severe depression   Psychosocial Evaluation and Intervention:   Psychosocial Re-Evaluation:   Psychosocial Discharge (Final Psychosocial Re-Evaluation):   Vocational Rehabilitation: Provide vocational rehab assistance to qualifying candidates.  Vocational Rehab Evaluation & Intervention:  Vocational Rehab - 04/18/24 1308       Initial Vocational Rehab Evaluation & Intervention   Assessment shows need for Vocational Rehabilitation No   Makye is retired and does not need vocational rehab at this time         Education: Education Goals: Education classes will be provided on a weekly basis, covering required topics. Participant will state understanding/return demonstration of topics presented.     Core Videos: Exercise    Move It!  Clinical staff conducted group or individual video education with verbal and written material and guidebook.  Patient learns the recommended Pritikin exercise program. Exercise with the goal of living a long, healthy life. Some of the health benefits of exercise include controlled diabetes, healthier blood pressure levels, improved cholesterol levels, improved heart and lung capacity, improved sleep, and better body composition. Everyone should speak with their doctor before starting or changing an exercise routine.  Biomechanical Limitations Clinical staff conducted group or individual video education with verbal and written material and guidebook.  Patient learns how biomechanical limitations can impact exercise and how we can mitigate and possibly overcome limitations to have an impactful and balanced exercise routine.  Body Composition Clinical staff  conducted group or individual video education with verbal and written material and guidebook.  Patient learns that body composition (ratio of muscle mass to fat mass) is a key component to assessing overall fitness, rather than body weight alone. Increased fat mass, especially visceral belly fat, can put us  at increased risk for metabolic syndrome, type 2 diabetes, heart disease, and even death. It is recommended to combine diet and exercise (cardiovascular and resistance training) to improve your body composition. Seek guidance from your physician and exercise physiologist before implementing an exercise routine.  Exercise Action Plan Clinical staff conducted group or individual video education with verbal and written material and guidebook.  Patient learns the recommended strategies to achieve and enjoy long-term exercise adherence, including variety, self-motivation, self-efficacy, and positive decision making. Benefits of exercise include fitness, good health, weight management, more energy, better sleep, less stress, and overall well-being.  Medical   Heart Disease Risk Reduction Clinical staff conducted group or individual video education with verbal and written material and guidebook.  Patient learns our heart is our most vital organ as it circulates oxygen, nutrients, white blood cells, and hormones throughout the entire body, and carries waste away. Data supports a plant-based eating plan like the Pritikin Program for its effectiveness in slowing progression of and reversing heart disease. The video provides a number of recommendations to address heart disease.   Metabolic Syndrome and Belly Fat  Clinical staff conducted group or individual video education with verbal and written material and guidebook.  Patient learns what metabolic syndrome is, how it leads to heart disease, and how one can reverse it and keep it from coming back. You have metabolic syndrome if you have 3 of the following 5  criteria: abdominal obesity, high blood pressure, high triglycerides, low HDL cholesterol, and high blood sugar.  Hypertension and Heart Disease Clinical staff conducted group or individual video education with verbal and written material and guidebook.  Patient learns that high blood pressure, or hypertension, is very common in the [place] . Hypertension is largely due to excessive salt intake, but other important risk factors include being overweight, physical inactivity, drinking too much alcohol, smoking, and not eating enough potassium from fruits and vegetables. High blood pressure is a leading risk factor  for heart attack, stroke, congestive heart failure, dementia, kidney failure, and premature death. Long-term effects of excessive salt intake include stiffening of the arteries and thickening of heart muscle and organ damage. Recommendations include ways to reduce hypertension and the risk of heart disease.  Diseases of Our Time - Focusing on Diabetes Clinical staff conducted group or individual video education with verbal and written material and guidebook.  Patient learns why the best way to stop diseases of our time is prevention, through food and other lifestyle changes. Medicine (such as prescription pills and surgeries) is often only a Band-Aid on the problem, not a long-term solution. Most common diseases of our time include obesity, type 2 diabetes, hypertension, heart disease, and cancer. The Pritikin Program is recommended and has been proven to help reduce, reverse, and/or prevent the damaging effects of metabolic syndrome.  Nutrition   Overview of the Pritikin Eating Plan  Clinical staff conducted group or individual video education with verbal and written material and guidebook.  Patient learns about the Pritikin Eating Plan for disease risk reduction. The Pritikin Eating Plan emphasizes a wide variety of unrefined, minimally-processed carbohydrates, like fruits, vegetables,  whole grains, and legumes. Go, Caution, and Stop food choices are explained. Plant-based and lean animal proteins are emphasized. Rationale provided for low sodium intake for blood pressure control, low added sugars for blood sugar stabilization, and low added fats and oils for coronary artery disease risk reduction and weight management.  Calorie Density  Clinical staff conducted group or individual video education with verbal and written material and guidebook.  Patient learns about calorie density and how it impacts the Pritikin Eating Plan. Knowing the characteristics of the food you choose will help you decide whether those foods will lead to weight gain or weight loss, and whether you want to consume more or less of them. Weight loss is usually a side effect of the Pritikin Eating Plan because of its focus on low calorie-dense foods.  Label Reading  Clinical staff conducted group or individual video education with verbal and written material and guidebook.  Patient learns about the Pritikin recommended label reading guidelines and corresponding recommendations regarding calorie density, added sugars, sodium content, and whole grains.  Dining Out - Part 1  Clinical staff conducted group or individual video education with verbal and written material and guidebook.  Patient learns that restaurant meals can be sabotaging because they can be so high in calories, fat, sodium, and/or sugar. Patient learns recommended strategies on how to positively address this and avoid unhealthy pitfalls.  Facts on Fats  Clinical staff conducted group or individual video education with verbal and written material and guidebook.  Patient learns that lifestyle modifications can be just as effective, if not more so, as many medications for lowering your risk of heart disease. A Pritikin lifestyle can help to reduce your risk of inflammation and atherosclerosis (cholesterol build-up, or plaque, in the artery walls).  Lifestyle interventions such as dietary choices and physical activity address the cause of atherosclerosis. A review of the types of fats and their impact on blood cholesterol levels, along with dietary recommendations to reduce fat intake is also included.  Nutrition Action Plan  Clinical staff conducted group or individual video education with verbal and written material and guidebook.  Patient learns how to incorporate Pritikin recommendations into their lifestyle. Recommendations include planning and keeping personal health goals in mind as an important part of their success.  Healthy Mind-Set    Healthy Minds, Bodies, Hearts  Clinical staff conducted group or individual video education with verbal and written material and guidebook.  Patient learns how to identify when they are stressed. Video will discuss the impact of that stress, as well as the many benefits of stress management. Patient will also be introduced to stress management techniques. The way we think, act, and feel has an impact on our hearts.  How Our Thoughts Can Heal Our Hearts  Clinical staff conducted group or individual video education with verbal and written material and guidebook.  Patient learns that negative thoughts can cause depression and anxiety. This can result in negative lifestyle behavior and serious health problems. Cognitive behavioral therapy is an effective method to help control our thoughts in order to change and improve our emotional outlook.  Additional Videos:  Exercise    Improving Performance  Clinical staff conducted group or individual video education with verbal and written material and guidebook.  Patient learns to use a non-linear approach by alternating intensity levels and lengths of time spent exercising to help burn more calories and lose more body fat. Cardiovascular exercise helps improve heart health, metabolism, hormonal balance, blood sugar control, and recovery from fatigue. Resistance  training improves strength, endurance, balance, coordination, reaction time, metabolism, and muscle mass. Flexibility exercise improves circulation, posture, and balance. Seek guidance from your physician and exercise physiologist before implementing an exercise routine and learn your capabilities and proper form for all exercise.  Introduction to Yoga  Clinical staff conducted group or individual video education with verbal and written material and guidebook.  Patient learns about yoga, a discipline of the coming together of mind, breath, and body. The benefits of yoga include improved flexibility, improved range of motion, better posture and core strength, increased lung function, weight loss, and positive self-image. Yoga's heart health benefits include lowered blood pressure, healthier heart rate, decreased cholesterol and triglyceride levels, improved immune function, and reduced stress. Seek guidance from your physician and exercise physiologist before implementing an exercise routine and learn your capabilities and proper form for all exercise.  Medical   Aging: Enhancing Your Quality of Life  Clinical staff conducted group or individual video education with verbal and written material and guidebook.  Patient learns key strategies and recommendations to stay in good physical health and enhance quality of life, such as prevention strategies, having an advocate, securing a Health Care Proxy and Power of Attorney, and keeping a list of medications and system for tracking them. It also discusses how to avoid risk for bone loss.  Biology of Weight Control  Clinical staff conducted group or individual video education with verbal and written material and guidebook.  Patient learns that weight gain occurs because we consume more calories than we burn (eating more, moving less). Even if your body weight is normal, you may have higher ratios of fat compared to muscle mass. Too much body fat puts you at  increased risk for cardiovascular disease, heart attack, stroke, type 2 diabetes, and obesity-related cancers. In addition to exercise, following the Pritikin Eating Plan can help reduce your risk.  Decoding Lab Results  Clinical staff conducted group or individual video education with verbal and written material and guidebook.  Patient learns that lab test reflects one measurement whose values change over time and are influenced by many factors, including medication, stress, sleep, exercise, food, hydration, pre-existing medical conditions, and more. It is recommended to use the knowledge from this video to become more involved with your lab results and evaluate your numbers to  speak with your doctor.   Diseases of Our Time - Overview  Clinical staff conducted group or individual video education with verbal and written material and guidebook.  Patient learns that according to the CDC, 50% to 70% of chronic diseases (such as obesity, type 2 diabetes, elevated lipids, hypertension, and heart disease) are avoidable through lifestyle improvements including healthier food choices, listening to satiety cues, and increased physical activity.  Sleep Disorders Clinical staff conducted group or individual video education with verbal and written material and guidebook.  Patient learns how good quality and duration of sleep are important to overall health and well-being. Patient also learns about sleep disorders and how they impact health along with recommendations to address them, including discussing with a physician.  Nutrition  Dining Out - Part 2 Clinical staff conducted group or individual video education with verbal and written material and guidebook.  Patient learns how to plan ahead and communicate in order to maximize their dining experience in a healthy and nutritious manner. Included are recommended food choices based on the type of restaurant the patient is visiting.   Fueling a Fish farm manager conducted group or individual video education with verbal and written material and guidebook.  There is a strong connection between our food choices and our health. Diseases like obesity and type 2 diabetes are very prevalent and are in large-part due to lifestyle choices. The Pritikin Eating Plan provides plenty of food and hunger-curbing satisfaction. It is easy to follow, affordable, and helps reduce health risks.  Menu Workshop  Clinical staff conducted group or individual video education with verbal and written material and guidebook.  Patient learns that restaurant meals can sabotage health goals because they are often packed with calories, fat, sodium, and sugar. Recommendations include strategies to plan ahead and to communicate with the manager, chef, or server to help order a healthier meal.  Planning Your Eating Strategy  Clinical staff conducted group or individual video education with verbal and written material and guidebook.  Patient learns about the Pritikin Eating Plan and its benefit of reducing the risk of disease. The Pritikin Eating Plan does not focus on calories. Instead, it emphasizes high-quality, nutrient-rich foods. By knowing the characteristics of the foods, we choose, we can determine their calorie density and make informed decisions.  Targeting Your Nutrition Priorities  Clinical staff conducted group or individual video education with verbal and written material and guidebook.  Patient learns that lifestyle habits have a tremendous impact on disease risk and progression. This video provides eating and physical activity recommendations based on your personal health goals, such as reducing LDL cholesterol, losing weight, preventing or controlling type 2 diabetes, and reducing high blood pressure.  Vitamins and Minerals  Clinical staff conducted group or individual video education with verbal and written material and guidebook.  Patient learns different  ways to obtain key vitamins and minerals, including through a recommended healthy diet. It is important to discuss all supplements you take with your doctor.   Healthy Mind-Set    Smoking Cessation  Clinical staff conducted group or individual video education with verbal and written material and guidebook.  Patient learns that cigarette smoking and tobacco addiction pose a serious health risk which affects millions of people. Stopping smoking will significantly reduce the risk of heart disease, lung disease, and many forms of cancer. Recommended strategies for quitting are covered, including working with your doctor to develop a successful plan.  Culinary   Becoming a Neurosurgeon  Clinical staff conducted group or individual video education with verbal and written material and guidebook.  Patient learns that cooking at home can be healthy, cost-effective, quick, and puts them in control. Keys to cooking healthy recipes will include looking at your recipe, assessing your equipment needs, planning ahead, making it simple, choosing cost-effective seasonal ingredients, and limiting the use of added fats, salts, and sugars.  Cooking - Breakfast and Snacks  Clinical staff conducted group or individual video education with verbal and written material and guidebook.  Patient learns how important breakfast is to satiety and nutrition through the entire day. Recommendations include key foods to eat during breakfast to help stabilize blood sugar levels and to prevent overeating at meals later in the day. Planning ahead is also a key component.  Cooking - Educational psychologist conducted group or individual video education with verbal and written material and guidebook.  Patient learns eating strategies to improve overall health, including an approach to cook more at home. Recommendations include thinking of animal protein as a side on your plate rather than center stage and focusing instead on  lower calorie dense options like vegetables, fruits, whole grains, and plant-based proteins, such as beans. Making sauces in large quantities to freeze for later and leaving the skin on your vegetables are also recommended to maximize your experience.  Cooking - Healthy Salads and Dressing Clinical staff conducted group or individual video education with verbal and written material and guidebook.  Patient learns that vegetables, fruits, whole grains, and legumes are the foundations of the Pritikin Eating Plan. Recommendations include how to incorporate each of these in flavorful and healthy salads, and how to create homemade salad dressings. Proper handling of ingredients is also covered. Cooking - Soups and [employer] - Soups and Desserts Clinical staff conducted group or individual video education with verbal and written material and guidebook.  Patient learns that Pritikin soups and desserts make for easy, nutritious, and delicious snacks and meal components that are low in sodium, fat, sugar, and calorie density, while high in vitamins, minerals, and filling fiber. Recommendations include simple and healthy ideas for soups and desserts.   Overview     The Pritikin Solution Program Overview Clinical staff conducted group or individual video education with verbal and written material and guidebook.  Patient learns that the results of the Pritikin Program have been documented in more than 100 articles published in peer-reviewed journals, and the benefits include reducing risk factors for (and, in some cases, even reversing) high cholesterol, high blood pressure, type 2 diabetes, obesity, and more! An overview of the three key pillars of the Pritikin Program will be covered: eating well, doing regular exercise, and having a healthy mind-set.  WORKSHOPS  Exercise: Exercise Basics: Building Your Action Plan Clinical staff led group instruction and group discussion with PowerPoint presentation  and patient guidebook. To enhance the learning environment the use of posters, models and videos may be added. At the conclusion of this workshop, patients will comprehend the difference between physical activity and exercise, as well as the benefits of incorporating both, into their routine. Patients will understand the FITT (Frequency, Intensity, Time, and Type) principle and how to use it to build an exercise action plan. In addition, safety concerns and other considerations for exercise and cardiac rehab will be addressed by the presenter. The purpose of this lesson is to promote a comprehensive and effective weekly exercise routine in order to improve patients' overall level of fitness.  Managing Heart Disease: Your Path to a Healthier Heart Clinical staff led group instruction and group discussion with PowerPoint presentation and patient guidebook. To enhance the learning environment the use of posters, models and videos may be added.At the conclusion of this workshop, patients will understand the anatomy and physiology of the heart. Additionally, they will understand how Pritikin's three pillars impact the risk factors, the progression, and the management of heart disease.  The purpose of this lesson is to provide a high-level overview of the heart, heart disease, and how the Pritikin lifestyle positively impacts risk factors.  Exercise Biomechanics Clinical staff led group instruction and group discussion with PowerPoint presentation and patient guidebook. To enhance the learning environment the use of posters, models and videos may be added. Patients will learn how the structural parts of their bodies function and how these functions impact their daily activities, movement, and exercise. Patients will learn how to promote a neutral spine, learn how to manage pain, and identify ways to improve their physical movement in order to promote healthy living. The purpose of this lesson is to  expose patients to common physical limitations that impact physical activity. Participants will learn practical ways to adapt and manage aches and pains, and to minimize their effect on regular exercise. Patients will learn how to maintain good posture while sitting, walking, and lifting.  Balance Training and Fall Prevention  Clinical staff led group instruction and group discussion with PowerPoint presentation and patient guidebook. To enhance the learning environment the use of posters, models and videos may be added. At the conclusion of this workshop, patients will understand the importance of their sensorimotor skills (vision, proprioception, and the vestibular system) in maintaining their ability to balance as they age. Patients will apply a variety of balancing exercises that are appropriate for their current level of function. Patients will understand the common causes for poor balance, possible solutions to these problems, and ways to modify their physical environment in order to minimize their fall risk. The purpose of this lesson is to teach patients about the importance of maintaining balance as they age and ways to minimize their risk of falling.  WORKSHOPS   Nutrition:  Fueling a Ship broker led group instruction and group discussion with PowerPoint presentation and patient guidebook. To enhance the learning environment the use of posters, models and videos may be added. Patients will review the foundational principles of the Pritikin Eating Plan and understand what constitutes a serving size in each of the food groups. Patients will also learn Pritikin-friendly foods that are better choices when away from home and review make-ahead meal and snack options. Calorie density will be reviewed and applied to three nutrition priorities: weight maintenance, weight loss, and weight gain. The purpose of this lesson is to reinforce (in a group setting) the key concepts around  what patients are recommended to eat and how to apply these guidelines when away from home by planning and selecting Pritikin-friendly options. Patients will understand how calorie density may be adjusted for different weight management goals.  Mindful Eating  Clinical staff led group instruction and group discussion with PowerPoint presentation and patient guidebook. To enhance the learning environment the use of posters, models and videos may be added. Patients will briefly review the concepts of the Pritikin Eating Plan and the importance of low-calorie dense foods. The concept of mindful eating will be introduced as well as the importance of paying attention to internal hunger signals. Triggers for non-hunger eating and  techniques for dealing with triggers will be explored. The purpose of this lesson is to provide patients with the opportunity to review the basic principles of the Pritikin Eating Plan, discuss the value of eating mindfully and how to measure internal cues of hunger and fullness using the Hunger Scale. Patients will also discuss reasons for non-hunger eating and learn strategies to use for controlling emotional eating.  Targeting Your Nutrition Priorities Clinical staff led group instruction and group discussion with PowerPoint presentation and patient guidebook. To enhance the learning environment the use of posters, models and videos may be added. Patients will learn how to determine their genetic susceptibility to disease by reviewing their family history. Patients will gain insight into the importance of diet as part of an overall healthy lifestyle in mitigating the impact of genetics and other environmental insults. The purpose of this lesson is to provide patients with the opportunity to assess their personal nutrition priorities by looking at their family history, their own health history and current risk factors. Patients will also be able to discuss ways of prioritizing and  modifying the Pritikin Eating Plan for their highest risk areas  Menu  Clinical staff led group instruction and group discussion with PowerPoint presentation and patient guidebook. To enhance the learning environment the use of posters, models and videos may be added. Using menus brought in from [employer], or printed from [employer], patients will apply the Pritikin dining out guidelines that were presented in the [employer] video. Patients will also be able to practice these guidelines in a variety of provided scenarios. The purpose of this lesson is to provide patients with the opportunity to practice hands-on learning of the Pritikin Dining Out guidelines with actual menus and practice scenarios.  Label Reading Clinical staff led group instruction and group discussion with PowerPoint presentation and patient guidebook. To enhance the learning environment the use of posters, models and videos may be added. Patients will review and discuss the Pritikin label reading guidelines presented in Pritikin's Label Reading Educational series video. Using fool labels brought in from local grocery stores and markets, patients will apply the label reading guidelines and determine if the packaged food meet the Pritikin guidelines. The purpose of this lesson is to provide patients with the opportunity to review, discuss, and practice hands-on learning of the Pritikin Label Reading guidelines with actual packaged food labels. Cooking School  Pritikin's [employer] are designed to teach patients ways to prepare quick, simple, and affordable recipes at home. The importance of nutrition's role in chronic disease risk reduction is reflected in its emphasis in the overall Pritikin program. By learning how to prepare essential core Pritikin Eating Plan recipes, patients will increase control over what they eat; be able to customize the flavor of foods without the use of added salt,  sugar, or fat; and improve the quality of the food they consume. By learning a set of core recipes which are easily assembled, quickly prepared, and affordable, patients are more likely to prepare more healthy foods at home. These workshops focus on convenient breakfasts, simple entres, side dishes, and desserts which can be prepared with minimal effort and are consistent with nutrition recommendations for cardiovascular risk reduction. Cooking [employer] are taught by a Armed forces logistics/support/administrative officer (RD) who has been trained by the [employer]. The chef or RD has a clear understanding of the importance of minimizing - if not completely eliminating - added fat, sugar, and sodium in  recipes. Throughout the series of Cooking School Workshop sessions, patients will learn about healthy ingredients and efficient methods of cooking to build confidence in their capability to prepare    Cooking School weekly topics:  Adding Flavor- Sodium-Free  Fast and Healthy Breakfasts  Powerhouse Plant-Based Proteins  Satisfying Salads and Dressings  Simple Sides and Sauces  International Cuisine-Spotlight on the [employer] Zones  Delicious Desserts  Savory Soups  [employer] - Meals in a Astronomer Appetizers and Snacks  Comforting Weekend Breakfasts  One-Pot Wonders   Fast Evening Meals  Landscape architect Your Pritikin Plate  WORKSHOPS   Healthy Mindset (Psychosocial):  Focused Goals, Sustainable Changes Clinical staff led group instruction and group discussion with PowerPoint presentation and patient guidebook. To enhance the learning environment the use of posters, models and videos may be added. Patients will be able to apply effective goal setting strategies to establish at least one personal goal, and then take consistent, meaningful action toward that goal. They will learn to identify common barriers to achieving personal goals and develop strategies to overcome  them. Patients will also gain an understanding of how our mind-set can impact our ability to achieve goals and the importance of cultivating a positive and growth-oriented mind-set. The purpose of this lesson is to provide patients with a deeper understanding of how to set and achieve personal goals, as well as the tools and strategies needed to overcome common obstacles which may arise along the way.  From Head to Heart: The Power of a Healthy Outlook  Clinical staff led group instruction and group discussion with PowerPoint presentation and patient guidebook. To enhance the learning environment the use of posters, models and videos may be added. Patients will be able to recognize and describe the impact of emotions and mood on physical health. They will discover the importance of self-care and explore self-care practices which may work for them. Patients will also learn how to utilize the 4 C's to cultivate a healthier outlook and better manage stress and challenges. The purpose of this lesson is to demonstrate to patients how a healthy outlook is an essential part of maintaining good health, especially as they continue their cardiac rehab journey.  Healthy Sleep for a Healthy Heart Clinical staff led group instruction and group discussion with PowerPoint presentation and patient guidebook. To enhance the learning environment the use of posters, models and videos may be added. At the conclusion of this workshop, patients will be able to demonstrate knowledge of the importance of sleep to overall health, well-being, and quality of life. They will understand the symptoms of, and treatments for, common sleep disorders. Patients will also be able to identify daytime and nighttime behaviors which impact sleep, and they will be able to apply these tools to help manage sleep-related challenges. The purpose of this lesson is to provide patients with a general overview of sleep and outline the importance of quality  sleep. Patients will learn about a few of the most common sleep disorders. Patients will also be introduced to the concept of "sleep hygiene," and discover ways to self-manage certain sleeping problems through simple daily behavior changes. Finally, the workshop will motivate patients by clarifying the links between quality sleep and their goals of heart-healthy living.   Recognizing and Reducing Stress Clinical staff led group instruction and group discussion with PowerPoint presentation and patient guidebook. To enhance the learning environment the use of posters, models and videos may be added. At the conclusion of this  workshop, patients will be able to understand the types of stress reactions, differentiate between acute and chronic stress, and recognize the impact that chronic stress has on their health. They will also be able to apply different coping mechanisms, such as reframing negative self-talk. Patients will have the opportunity to practice a variety of stress management techniques, such as deep abdominal breathing, progressive muscle relaxation, and/or guided imagery.  The purpose of this lesson is to educate patients on the role of stress in their lives and to provide healthy techniques for coping with it.  Learning Barriers/Preferences:  Learning Barriers/Preferences - 04/18/24 1341       Learning Barriers/Preferences   Learning Barriers Language   He is unable to read and needs individual attention   Learning Preferences Audio;Group Instruction;Individual Instruction;Pictoral;Verbal Instruction          Education Topics:  Knowledge Questionnaire Score:  Knowledge Questionnaire Score - 04/18/24 0900       Knowledge Questionnaire Score   Pre Score 21/24          Core Components/Risk Factors/Patient Goals at Admission:  Personal Goals and Risk Factors at Admission - 04/18/24 1019       Core Components/Risk Factors/Patient Goals on Admission    Weight Management  Yes;Weight Maintenance    Expected Outcomes Weight Maintenance: Understanding of the daily nutrition guidelines, which includes 25-35% calories from fat, 7% or less cal from saturated fats, less than 200mg  cholesterol, less than 1.5gm of sodium, & 5 or more servings of fruits and vegetables daily;Understanding recommendations for meals to include 15-35% energy as protein, 25-35% energy from fat, 35-60% energy from carbohydrates, less than 200mg  of dietary cholesterol, 20-35 gm of total fiber daily;Understanding of distribution of calorie intake throughout the day with the consumption of 4-5 meals/snacks    Heart Failure Yes    Intervention Provide a combined exercise and nutrition program that is supplemented with education, support and counseling about heart failure. Directed toward relieving symptoms such as shortness of breath, decreased exercise tolerance, and extremity edema.    Expected Outcomes Improve functional capacity of life;Short term: Attendance in program 2-3 days a week with increased exercise capacity. Reported lower sodium intake. Reported increased fruit and vegetable intake. Reports medication compliance.;Short term: Daily weights obtained and reported for increase. Utilizing diuretic protocols set by physician.;Long term: Adoption of self-care skills and reduction of barriers for early signs and symptoms recognition and intervention leading to self-care maintenance.    Hypertension Yes    Intervention Provide education on lifestyle modifcations including regular physical activity/exercise, weight management, moderate sodium restriction and increased consumption of fresh fruit, vegetables, and low fat dairy, alcohol moderation, and smoking cessation.;Monitor prescription use compliance.    Expected Outcomes Short Term: Continued assessment and intervention until BP is < 140/13mm HG in hypertensive participants. < 130/38mm HG in hypertensive participants with diabetes, heart failure or  chronic kidney disease.;Long Term: Maintenance of blood pressure at goal levels.    Lipids Yes    Intervention Provide education and support for participant on nutrition & aerobic/resistive exercise along with prescribed medications to achieve LDL 70mg , HDL >40mg .    Expected Outcomes Short Term: Participant states understanding of desired cholesterol values and is compliant with medications prescribed. Participant is following exercise prescription and nutrition guidelines.;Long Term: Cholesterol controlled with medications as prescribed, with individualized exercise RX and with personalized nutrition plan. Value goals: LDL < 70mg , HDL > 40 mg.    Personal Goal Other Yes    Personal Goal He  wants to feel back to normal, know limits to exercise, get back to the gym, strength    Intervention Will continue to monitor pt and progressworkloads as tolerated without sign or symptom    Expected Outcomes Will continue to monitor pt and progress workloads as tolerated          Core Components/Risk Factors/Patient Goals Review:    Core Components/Risk Factors/Patient Goals at Discharge (Final Review):    ITP Comments:  ITP Comments     Row Name 04/18/24 0749           ITP Comments Dr. Wilbert Bihari medical director. Introduction to pritikin education/intensive cardiac rehab. Initial orientation packet reviewed with patient.          Comments:Participant attended orientation for the cardiac rehabilitation program on  04/18/2024  to perform initial intake and exercise walk test. Patient introduced to the Pritikin Program education and orientation packet was reviewed. Completed 6-minute walk test, measurements, initial ITP, and exercise prescription. Vital signs stable. Telemetry-normal sinus rhythm, with intermittent frequent PVC's asymptomatic. Onsite provider and heart failure clinic notified about ectopy.Hadassah Elpidio Quan RN BSN   Service time was from [phone] to [phone].

## 2024-04-18 NOTE — Telephone Encounter (Signed)
-----   Message from Harlene CHRISTELLA Gainer sent at 04/18/2024  3:22 PM EDT ----- Please place 2 week Zio for PVC monitoring. Also OK to give PRN SL nitroglycerin  Rx ----- Message ----- From: Cyrus Hadassah ORN, RN Sent: 04/18/2024   2:24 PM EDT To: Harlene CHRISTELLA Gainer, FNP; Powell CHRISTELLA Latino, RN  Good afternoon Harlene, Pt was noted to have intermittent frequent PVC's at cardiac rehab. Asymptomatic. Vital signs were stable. Please review attached media. Patient's last K was 3.4. Mr Guardia is due to [employer] recheck at the end of the month.  I also wanted to ask if Mr Stamey need a rx for sublingual nitroglycerin ? He does not have a current prescription.  I appreciate your input! Powell, thanks for your help! Sincerely, [facility]  Cardiac Rehab

## 2024-04-18 NOTE — Progress Notes (Addendum)
 Pt was noted to have intermittent frequent PVC's at cardiac rehab. Asymptomatic. Vital signs were stable. Onsite provider Damien Braver NP and the heart failure clinic was notified. Spoke with Powell Latino RN at the heart failure clinic. Damien Braver NP said patient is okay to proceed with 6 minute walk test. Will continue to monitor the patient throughout  the program. Will send today's ECG tracings to Harlene Ganong NP for review.Hadassah Elpidio Quan RN BSN

## 2024-04-22 ENCOUNTER — Encounter (HOSPITAL_COMMUNITY)

## 2024-04-22 ENCOUNTER — Encounter (HOSPITAL_COMMUNITY)
Admission: RE | Admit: 2024-04-22 | Discharge: 2024-04-22 | Disposition: A | Discharge status: Home / Self Care | Source: Ambulatory Visit | Attending: Internal Medicine

## 2024-04-22 DIAGNOSIS — I213 ST elevation (STEMI) myocardial infarction of unspecified site: Secondary | ICD-10-CM

## 2024-04-22 DIAGNOSIS — Z955 Presence of coronary angioplasty implant and graft: Secondary | ICD-10-CM | POA: Diagnosis not present

## 2024-04-22 NOTE — Progress Notes (Signed)
 Daily Session Note  Patient Details  Name: Jeffery Graves MRN: [id number] Date of Birth: Jan 14, 1956 Referring Provider:   Flowsheet Row INTENSIVE CARDIAC REHAB ORIENT from 04/18/2024 in [facility]  Referring Provider Dr. Toribio Fuel, MD    Encounter Date: 04/22/2024  Check In:  Session Check In - 04/22/24 0709       Check-In   Supervising physician immediately available to respond to emergencies [facility] MD immediately available    Physician(s) Damien Braver, NP    Location [facility]    Staff Present Alec Finder BS, ACSM-CEP, Exercise Physiologist;Bailey Elnor, MS, Exercise Physiologist;Johnny Fayette, MS, Exercise Physiologist;Kaylee Nicholaus, MS, ACSM-CEP, Exercise Physiologist;Isaiha Asare Violetta, RN, MHA    Virtual Visit No    Medication changes reported     No    Fall or balance concerns reported    No    Tobacco Cessation No Change    Warm-up and Cool-down Performed as group-led instruction    Resistance Training Performed Yes    VAD Patient? No    PAD/SET Patient? No      Pain Assessment   Currently in Pain? No/denies    Pain Score 0-No pain    Multiple Pain Sites No          Capillary Blood Glucose: No results found for this or any previous visit (from the past 24 hours).   Exercise Prescription Changes - 04/22/24 0833       Response to Exercise   Blood Pressure (Admit) 132/70    Blood Pressure (Exercise) 140/68    Blood Pressure (Exit) 110/60    Heart Rate (Admit) 61 bpm    Heart Rate (Exercise) 107 bpm    Heart Rate (Exit) 69 bpm    Rating of Perceived Exertion (Exercise) 8    Perceived Dyspnea (Exercise) 0    Symptoms none    Comments First day in the cardiac rehab Pritikin program    Duration Progress to 30 minutes of  aerobic without signs/symptoms of physical distress    Intensity THRR unchanged      Progression   Progression Continue to progress workloads to maintain  intensity without signs/symptoms of physical distress.    Average METs 2.71      Resistance Training   Training Prescription Yes    Weight 3 lbs    Reps 10-15    Time 10 Minutes      Treadmill   MPH 2.5    Grade 0    Minutes 15    METs 2.91      Recumbant Elliptical   Level 2    RPM 39    Watts 50    Minutes 15    METs 2.5          Social History   Tobacco Use  Smoking Status Never  Smokeless Tobacco Never    Goals Met:  Independence with exercise equipment Exercise tolerated well No report of concerns or symptoms today Strength training completed today  Goals Unmet:  Not Applicable  Comments: Pt in today for first group exercise/education session of cardiac rehab. Pt tolerated light exercise without difficulty. VSS, telemetry-Sinus Rhythm with PACs and PVCs, asymptomatic. Medication list reconciled at orientation on 04/18/24 with no changes to report this morning. Pt denies barriers to medication compliance.  PSYCHOSOCIAL ASSESSMENT:  PHQ9=0. Pt exhibits positive coping skills. No psychosocial needs identified at this time, no psychosocial interventions necessary.  Pt oriented to exercise  equipment and gym routine. Understanding verbalized.    Dr. Wilbert Bihari is Medical Director for Cardiac Rehab at [facility].

## 2024-04-24 ENCOUNTER — Encounter (HOSPITAL_COMMUNITY)
Admission: RE | Admit: 2024-04-24 | Discharge: 2024-04-24 | Disposition: A | Discharge status: Home / Self Care | Source: Ambulatory Visit | Attending: Internal Medicine | Admitting: Internal Medicine

## 2024-04-24 DIAGNOSIS — Z955 Presence of coronary angioplasty implant and graft: Secondary | ICD-10-CM | POA: Diagnosis not present

## 2024-04-24 DIAGNOSIS — I213 ST elevation (STEMI) myocardial infarction of unspecified site: Secondary | ICD-10-CM | POA: Diagnosis not present

## 2024-04-26 ENCOUNTER — Encounter (HOSPITAL_COMMUNITY)

## 2024-04-26 ENCOUNTER — Telehealth (HOSPITAL_COMMUNITY): Payer: Self-pay | Admitting: *Deleted

## 2024-04-26 ENCOUNTER — Other Ambulatory Visit: Payer: Self-pay

## 2024-04-26 MED ORDER — NITROGLYCERIN 0.4 MG SL SUBL
0.4000 mg | SUBLINGUAL_TABLET | SUBLINGUAL | 3 refills | Status: AC | PRN
  Filled 2024-04-26: qty 25, 9d supply, fill #0

## 2024-04-26 NOTE — Telephone Encounter (Signed)
 Left message to call back, pt is sch for labs 9/29

## 2024-04-26 NOTE — Telephone Encounter (Signed)
-----   Message from Harlene CHRISTELLA Gainer sent at 04/18/2024  3:22 PM EDT ----- Please place 2 week Zio for PVC monitoring. Also OK to give PRN SL nitroglycerin  Rx ----- Message ----- From: Cyrus Hadassah ORN, RN Sent: 04/18/2024   2:24 PM EDT To: Harlene CHRISTELLA Gainer, FNP; Powell CHRISTELLA Latino, RN  Good afternoon Harlene, Pt was noted to have intermittent frequent PVC's at cardiac rehab. Asymptomatic. Vital signs were stable. Please review attached media. Patient's last K was 3.4. Mr Guardia is due to [employer] recheck at the end of the month.  I also wanted to ask if Mr Stamey need a rx for sublingual nitroglycerin ? He does not have a current prescription.  I appreciate your input! Powell, thanks for your help! Sincerely, [facility]  Cardiac Rehab

## 2024-04-26 NOTE — Telephone Encounter (Signed)
 Spoke w/pt, he is aware, agreeable, and verbalized understanding, will get Zio placed on Mon 9/29 during lab appt, ntg RX sent in pt advised on how/when to take

## 2024-04-29 ENCOUNTER — Encounter (HOSPITAL_COMMUNITY)
Admission: RE | Admit: 2024-04-29 | Discharge: 2024-04-29 | Disposition: A | Discharge status: Home / Self Care | Source: Ambulatory Visit | Attending: Internal Medicine | Admitting: Internal Medicine

## 2024-04-29 ENCOUNTER — Ambulatory Visit (HOSPITAL_COMMUNITY)
Admission: RE | Admit: 2024-04-29 | Discharge: 2024-04-29 | Disposition: A | Source: Ambulatory Visit | Attending: Cardiology

## 2024-04-29 ENCOUNTER — Encounter (HOSPITAL_COMMUNITY)

## 2024-04-29 ENCOUNTER — Ambulatory Visit (HOSPITAL_COMMUNITY)

## 2024-04-29 ENCOUNTER — Other Ambulatory Visit: Payer: Self-pay

## 2024-04-29 ENCOUNTER — Ambulatory Visit (HOSPITAL_COMMUNITY): Payer: Self-pay | Admitting: Family Medicine

## 2024-04-29 DIAGNOSIS — I5022 Chronic systolic (congestive) heart failure: Secondary | ICD-10-CM | POA: Insufficient documentation

## 2024-04-29 DIAGNOSIS — Z955 Presence of coronary angioplasty implant and graft: Secondary | ICD-10-CM

## 2024-04-29 DIAGNOSIS — I213 ST elevation (STEMI) myocardial infarction of unspecified site: Secondary | ICD-10-CM

## 2024-04-29 LAB — BASIC METABOLIC PANEL WITH GFR
Anion gap: 8 (ref 5–15)
BUN: 12 mg/dL (ref 8–23)
CO2: 23 mmol/L (ref 22–32)
Calcium: 8.9 mg/dL (ref 8.9–10.3)
Chloride: 104 mmol/L (ref 98–111)
Creatinine, Ser: 0.95 mg/dL (ref 0.61–1.24)
GFR, Estimated: >60 mL/min (ref >60)
Glucose, Bld: 100 mg/dL — ABNORMAL HIGH (ref 70–99)
Potassium: 4.1 mmol/L (ref 3.5–5.1)
Sodium: 135 mmol/L (ref 135–145)

## 2024-05-01 ENCOUNTER — Encounter (HOSPITAL_COMMUNITY)
Admission: RE | Admit: 2024-05-01 | Discharge: 2024-05-01 | Disposition: A | Discharge status: Home / Self Care | Source: Ambulatory Visit | Attending: Internal Medicine | Admitting: Internal Medicine

## 2024-05-01 DIAGNOSIS — Z955 Presence of coronary angioplasty implant and graft: Secondary | ICD-10-CM | POA: Diagnosis present

## 2024-05-01 DIAGNOSIS — I213 ST elevation (STEMI) myocardial infarction of unspecified site: Secondary | ICD-10-CM | POA: Insufficient documentation

## 2024-05-02 ENCOUNTER — Other Ambulatory Visit: Payer: Self-pay

## 2024-05-03 ENCOUNTER — Encounter (HOSPITAL_COMMUNITY)

## 2024-05-06 ENCOUNTER — Encounter (HOSPITAL_COMMUNITY)

## 2024-05-06 ENCOUNTER — Encounter (HOSPITAL_COMMUNITY)
Admission: RE | Admit: 2024-05-06 | Discharge: 2024-05-06 | Disposition: A | Discharge status: Home / Self Care | Source: Ambulatory Visit | Attending: Internal Medicine | Admitting: Internal Medicine

## 2024-05-06 DIAGNOSIS — Z955 Presence of coronary angioplasty implant and graft: Secondary | ICD-10-CM

## 2024-05-06 DIAGNOSIS — I213 ST elevation (STEMI) myocardial infarction of unspecified site: Secondary | ICD-10-CM

## 2024-05-07 NOTE — Progress Notes (Signed)
 Cardiac Individual Treatment Plan  Patient Details  Name: Jeffery Graves MRN: [id number] Date of Birth: 02-23-1956 Referring Provider:   Flowsheet Row INTENSIVE CARDIAC REHAB ORIENT from 04/18/2024 in [facility]  Referring Provider Dr. 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  03/15/24 STEMI  Patient's Home Medications on Admission:  Current Outpatient Medications:    aspirin  81 MG chewable tablet, Chew 1 tablet (81 mg total) by mouth daily., Disp: 90 tablet, Rfl: 2   atorvastatin  (LIPITOR ) 80 MG tablet, Take 1 tablet (80 mg total) by mouth daily., Disp: 90 tablet, Rfl: 2   Brimonidine  Tartrate (LUMIFY  OP), Place 1 drop into both eyes daily., Disp: , Rfl:    clopidogrel  (PLAVIX ) 75 MG tablet, Take 1 tablet (75 mg total) by mouth daily., Disp: 90 tablet, Rfl: 2   dapagliflozin  propanediol (FARXIGA ) 10 MG TABS tablet, Take 1 tablet (10 mg total) by mouth daily before breakfast., Disp: 30 tablet, Rfl: 6   ENTRESTO  97-103 MG, Take 1 tablet by mouth 2 (two) times daily., Disp: 90 tablet, Rfl: 2   metoprolol  succinate (TOPROL -XL) 25 MG 24 hr tablet, Take 1 tablet (25 mg total) by mouth daily., Disp: 90 tablet, Rfl: 2   nitroGLYCERIN  (NITROSTAT ) 0.4 MG SL tablet, Place 1 tablet (0.4 mg total) under the tongue every 5 (five) minutes as needed for chest pain., Disp: 25 tablet, Rfl: 3   potassium chloride  SA (KLOR-CON  M) 20 MEQ tablet, Take 1 tablet (20 mEq total) by mouth daily for 10 days. (Patient not taking: Reported on 04/18/2024), Disp: 10 tablet, Rfl: 0   senna-docusate (SENOKOT-S) 8.6-50 MG tablet, Take 1 tablet by mouth 2 (two) times daily. While taking pain meds to prevent constipation (Patient not taking: Reported on 04/18/2024), Disp: 10  tablet, Rfl: 0   spironolactone  (ALDACTONE ) 25 MG tablet, Take 1 tablet (25 mg total) by mouth daily., Disp: 90 tablet, Rfl: 2   tamsulosin  (FLOMAX ) 0.4 MG CAPS capsule, Take 1 capsule (0.4 mg total) by mouth 2 (two) times daily., Disp: 60 capsule, Rfl: 11   XTANDI 80 MG tablet, Take 80 mg by mouth daily., Disp: , Rfl:   Past Medical History: Past Medical History:  Diagnosis Date   BPH (benign prostatic hyperplasia)    Coronary artery disease    Hx of radiation therapy 09/06/10 - 10/29/10   prostate, seminal vesicles   Hypertension    on meds   Prostate cancer (HCC) 04/26/2010   gleason 7, vol 20.88 cc    Tobacco Use: Social History   Tobacco Use  Smoking Status Never  Smokeless Tobacco Never    Labs: Review Flowsheet  More data exists      Latest Ref Rng & Units 06/21/2022 12/27/2022 02/12/2024 03/15/2024 03/16/2024  Labs for ITP Cardiac and Pulmonary Rehab  Cholestrol 0 - 200 mg/dL - 800  - 835  -  LDL (calc) 0 - 99 mg/dL - 897  - 895  -  HDL-C >40 mg/dL - 87  - 52  -  Trlycerides <150 mg/dL - 56  - 39  -  Hemoglobin A1c 4.8 - 5.6 % 5.1  - 5.4  5.2  5.2   PH, Arterial 7.35 - 7.45 - - - 7.371  -  PCO2 arterial  32 - 48 mmHg - - - 37.9  -  Bicarbonate 20.0 - 28.0 mmol/L - - - 21.9  -  TCO2 22 - 32 mmol/L - - - 23  -  Acid-base deficit 0.0 - 2.0 mmol/L - - - 3.0  -  O2 Saturation % - - - 100  -     Exercise Target Goals: Exercise Program Goal: Individual exercise prescription set using results from initial 6 min walk test and THRR while considering  patient's activity barriers and safety.   Exercise Prescription Goal: Initial exercise prescription builds to 30-45 minutes a day of aerobic activity, 2-3 days per week.  Home exercise guidelines will be given to patient during program as part of exercise prescription that the participant will acknowledge.   Education: Aerobic Exercise: - Group verbal and visual presentation on the components of exercise prescription.  Introduces F.I.T.T principle from ACSM for exercise prescriptions.  Reviews F.I.T.T. principles of aerobic exercise including progression. Written material provided at class time.   Education: Resistance Exercise: - Group verbal and visual presentation on the components of exercise prescription. Introduces F.I.T.T principle from ACSM for exercise prescriptions  Reviews F.I.T.T. principles of resistance exercise including progression. Written material provided at class time.    Education: Exercise & Equipment Safety: - Individual verbal instruction and demonstration of equipment use and safety with use of the equipment.   Education: Exercise Physiology & General Exercise Guidelines: - Group verbal and written instruction with models to review the exercise physiology of the cardiovascular system and associated critical values. Provides general exercise guidelines with specific guidelines to those with heart or lung disease. Written material provided at class time.   Education: Flexibility, Balance, Mind/Body Relaxation: - Group verbal and visual presentation with interactive activity on the components of exercise prescription. Introduces F.I.T.T principle from ACSM for exercise prescriptions. Reviews F.I.T.T. principles of flexibility and balance exercise training including progression. Also discusses the mind body connection.  Reviews various relaxation techniques to help reduce and manage stress (i.e. Deep breathing, progressive muscle relaxation, and visualization). Balance handout provided to take home. Written material provided at class time.   Activity Barriers & Risk Stratification:  Activity Barriers & Cardiac Risk Stratification - 04/18/24 1015       Activity Barriers & Cardiac Risk Stratification   Activity Barriers None    Cardiac Risk Stratification High          6 Minute Walk:  6 Minute Walk     Row Name 04/18/24 1007         6 Minute Walk   Phase Initial      Distance 1390 feet     Walk Time 6 minutes     # of Rest Breaks 0     MPH 2.63     METS 3.41     RPE 8     Perceived Dyspnea  0     VO2 Peak 11.94     Symptoms No     Resting HR 70 bpm     Resting BP 102/62     Resting Oxygen Saturation  100 %     Exercise Oxygen Saturation  during 6 min walk 99 %     Max Ex. HR 114 bpm     Max Ex. BP 126/76     2 Minute Post BP 112/72        Oxygen Initial Assessment:   Oxygen Re-Evaluation:   Oxygen Discharge (Final Oxygen Re-Evaluation):   Initial Exercise Prescription:  Initial Exercise Prescription - 04/18/24 1000       Date of Initial Exercise RX and Referring Provider   Date 04/18/24    Referring Provider Dr. Toribio Fuel, MD    Expected Discharge Date 07/12/24      Treadmill   MPH 2.5    Grade 0    Minutes 15    METs 2.91      Recumbant Elliptical   Level 2    RPM 60    Watts 80    Minutes 15    METs 3.2      Prescription Details   Frequency (times per week) 2    Duration Progress to 30 minutes of continuous aerobic without signs/symptoms of physical distress      Intensity   THRR 40-80% of Max Heartrate 62-123    Ratings of Perceived Exertion 11-13    Perceived Dyspnea 0-4      Progression   Progression Continue progressive overload as per policy without signs/symptoms or physical distress.      Resistance Training   Training Prescription Yes    Weight 3 lbs    Reps 10-15          Perform Capillary Blood Glucose checks as needed.  Exercise Prescription Changes:   Exercise Prescription Changes     Row Name 04/22/24 [phone]             Response to Exercise   Blood Pressure (Admit) 132/70       Blood Pressure (Exercise) 140/68       Blood Pressure (Exit) 110/60       Heart Rate (Admit) 61 bpm       Heart Rate (Exercise) 107 bpm       Heart Rate (Exit) 69 bpm       Rating of Perceived Exertion (Exercise) 8       Perceived Dyspnea (Exercise) 0       Symptoms none       Comments First  day in the cardiac rehab [facility] program       Duration Progress to 30 minutes of  aerobic without signs/symptoms of physical distress       Intensity THRR unchanged         Progression   Progression Continue to progress workloads to maintain intensity without signs/symptoms of physical distress.       Average METs 2.71         Resistance Training   Training Prescription Yes       Weight 3 lbs       Reps 10-15       Time 10 Minutes         Treadmill   MPH 2.5       Grade 0       Minutes 15       METs 2.91         Recumbant Elliptical   Level 2       RPM 39       Watts 50       Minutes 15       METs 2.5          Exercise Comments:   Exercise Comments     Row Name 04/22/24 0845           Exercise Comments Pt first day in the [facility] I[facility] program. Pt tolerated exercise well with an average MET level of 2.71. He is off to a good start and is learning his  THRR, RPE and ExRx          Exercise Goals and Review:   Exercise Goals     Row Name 04/18/24 1018             Exercise Goals   Increase Physical Activity Yes       Intervention Provide advice, education, support and counseling about physical activity/exercise needs.;Develop an individualized exercise prescription for aerobic and resistive training based on initial evaluation findings, risk stratification, comorbidities and participant's personal goals.       Expected Outcomes Short Term: Attend rehab on a regular basis to increase amount of physical activity.;Long Term: Exercising regularly at least 3-5 days a week.;Long Term: Add in home exercise to make exercise part of routine and to increase amount of physical activity.       Increase Strength and Stamina Yes       Intervention Provide advice, education, support and counseling about physical activity/exercise needs.;Develop an individualized exercise prescription for aerobic and resistive training based on initial evaluation findings, risk stratification,  comorbidities and participant's personal goals.       Expected Outcomes Short Term: Increase workloads from initial exercise prescription for resistance, speed, and METs.;Short Term: Perform resistance training exercises routinely during rehab and add in resistance training at home;Long Term: Improve cardiorespiratory fitness, muscular endurance and strength as measured by increased METs and functional capacity ( )       Able to understand and use rate of perceived exertion (RPE) scale Yes       Intervention Provide education and explanation on how to use RPE scale       Expected Outcomes Short Term: Able to use RPE daily in rehab to express subjective intensity level;Long Term:  Able to use RPE to guide intensity level when exercising independently       Knowledge and understanding of Target Heart Rate Range (THRR) Yes       Intervention Provide education and explanation of THRR including how the numbers were predicted and where they are located for reference       Expected Outcomes Short Term: Able to state/look up THRR;Long Term: Able to use THRR to govern intensity when exercising independently;Short Term: Able to use daily as guideline for intensity in rehab       Understanding of Exercise Prescription Yes       Intervention Provide education, explanation, and written materials on patient's individual exercise prescription       Expected Outcomes Short Term: Able to explain program exercise prescription;Long Term: Able to explain home exercise prescription to exercise independently          Exercise Goals Re-Evaluation :  Exercise Goals Re-Evaluation     Row Name 04/22/24 0839             Exercise Goal Re-Evaluation   Exercise Goals Review Increase Physical Activity;Understanding of Exercise Prescription;Increase Strength and Stamina;Knowledge and understanding of Target Heart Rate Range (THRR);Able to understand and use rate of perceived exertion (RPE) scale       Comments Pt first  day in the [facility] program. Pt tolerated exercise well with an average MET level of 2.71. He is off to a good start and is learning his THRR, RPE and ExRx       Expected Outcomes Will continue to monitor pt and progress workloads as tolerated without sign or symptom          Discharge Exercise Prescription (Final Exercise Prescription Changes):  Exercise Prescription Changes -  04/22/24 0833       Response to Exercise   Blood Pressure (Admit) 132/70    Blood Pressure (Exercise) 140/68    Blood Pressure (Exit) 110/60    Heart Rate (Admit) 61 bpm    Heart Rate (Exercise) 107 bpm    Heart Rate (Exit) 69 bpm    Rating of Perceived Exertion (Exercise) 8    Perceived Dyspnea (Exercise) 0    Symptoms none    Comments First day in the cardiac rehab [facility] program    Duration Progress to 30 minutes of  aerobic without signs/symptoms of physical distress    Intensity THRR unchanged      Progression   Progression Continue to progress workloads to maintain intensity without signs/symptoms of physical distress.    Average METs 2.71      Resistance Training   Training Prescription Yes    Weight 3 lbs    Reps 10-15    Time 10 Minutes      Treadmill   MPH 2.5    Grade 0    Minutes 15    METs 2.91      Recumbant Elliptical   Level 2    RPM 39    Watts 50    Minutes 15    METs 2.5          Nutrition:  Target Goals: Understanding of nutrition guidelines, daily intake of sodium 1500mg , cholesterol 200mg , calories 30% from fat and 7% or less from saturated fats, daily to have 5 or more servings of fruits and vegetables.  Education: Nutrition 1 -Group instruction provided by verbal, written material, interactive activities, discussions, models, and posters to present general guidelines for heart healthy nutrition including macronutrients, label reading, and promoting whole foods over processed counterparts. Education serves as Jeffery Graves consultant of discussion of heart healthy eating  for all. Written material provided at class time.    Education: Nutrition 2 -Group instruction provided by verbal, written material, interactive activities, discussions, models, and posters to present general guidelines for heart healthy nutrition including sodium, cholesterol, and saturated fat. Providing guidance of habit forming to improve blood pressure, cholesterol, and body weight. Written material provided at class time.     Biometrics:  Pre Biometrics - 04/18/24 1018       Pre Biometrics   Height 5' 6.75 (1.695 m)    Weight 76.7 kg    Waist Circumference 35 inches    Hip Circumference 38 inches    Waist to Hip Ratio 0.92 %    BMI (Calculated) 26.7    Triceps Skinfold 11 mm    % Body Fat 23.6 %    Grip Strength 15 kg    Flexibility 9 in    Single Leg Stand 24 seconds           Nutrition Therapy Plan and Nutrition Goals:   Nutrition Assessments:  Nutrition Assessments - 04/22/24 0924       MEDFICTS Scores   Pre Score 59         MEDIFICTS Score Key: >=70 Need to make dietary changes  40-70 Heart Healthy Diet <= 40 Therapeutic Level Cholesterol Diet  Flowsheet Row INTENSIVE CARDIAC REHAB from 04/22/2024 in [facility]  Picture Your Plate Total Score on Admission 59   Picture Your Plate Scores: <59 Unhealthy dietary pattern with much room for improvement. 41-50 Dietary pattern unlikely to meet recommendations for good health and room for improvement. 51-60 More healthful  dietary pattern, with some room for improvement.  >60 Healthy dietary pattern, although there may be some specific behaviors that could be improved.    Nutrition Goals Re-Evaluation:   Nutrition Goals Discharge (Final Nutrition Goals Re-Evaluation):   Psychosocial: Target Goals: Acknowledge presence or absence of significant depression and/or stress, maximize coping skills, provide positive support system. Participant is  able to verbalize types and ability to use techniques and skills needed for reducing stress and depression.   Education: Stress, Anxiety, and Depression - Group verbal and visual presentation to define topics covered.  Reviews how body is impacted by stress, anxiety, and depression.  Also discusses healthy ways to reduce stress and to treat/manage anxiety and depression. Written material provided at class time.   Education: Sleep Hygiene -Provides group verbal and written instruction about how sleep can affect your health.  Define sleep hygiene, discuss sleep cycles and impact of sleep habits. Review good sleep hygiene tips.   Initial Review & Psychosocial Screening:  Initial Psych Review & Screening - 04/18/24 1307       Initial Review   Current issues with None Identified      Family Dynamics   Good Support System? Yes   Jeffery Graves lives alone. Jeffery Graves has his neice Jeffery Graves and his brother Jeffery Graves for support     Barriers   Psychosocial barriers to participate in program There are no identifiable barriers or psychosocial needs.      Screening Interventions   Interventions Encouraged to exercise          Quality of Life Scores:   Quality of Life - 04/18/24 0859       Quality of Life   Select Quality of Life      Quality of Life Scores   Health/Function Pre 22.86 %    Socioeconomic Pre 19.58 %    Psych/Spiritual Pre 22.5 %    Family Pre 20.83 %    GLOBAL Pre 21.92 %         Scores of 19 and below usually indicate a poorer quality of life in these areas.  A difference of  2-3 points is a clinically meaningful difference.  A difference of 2-3 points in the total score of the Quality of Life Index has been associated with significant improvement in overall quality of life, self-image, physical symptoms, and general health in studies assessing change in quality of life.  PHQ-9: Review Flowsheet       04/18/2024 04/16/2024 06/22/2021 11/04/2015  Depression screen PHQ 2/9  Decreased  Interest 0 0 0 0  Down, Depressed, Hopeless 0 0 0 0  PHQ - 2 Score 0 0 0 0  Altered sleeping 0 0 0 0  Tired, decreased energy 0 0 0 0  Change in appetite 0 0 0 0  Feeling bad or failure about yourself  0 0 0 0  Trouble concentrating 0 0 0 3  Moving slowly or fidgety/restless 0 0 0 0  Suicidal thoughts 0 0 0 0   PHQ-9 Score 0 0 0 3  Difficult doing work/chores Not difficult at all Not difficult at all - -    Details       Data saved with a previous flowsheet row definition        Interpretation of Total Score  Total Score Depression Severity:  1-4 = Minimal depression, 5-9 = Mild depression, 10-14 = Moderate depression, 15-19 = Moderately severe depression, 20-27 = Severe depression   Psychosocial Evaluation and Intervention:   Psychosocial  Re-Evaluation:  Psychosocial Re-Evaluation     Row Name 04/30/24 1411             Psychosocial Re-Evaluation   Current issues with None Identified       Interventions Encouraged to attend Cardiac Rehabilitation for the exercise       Continue Psychosocial Services  No Follow up required          Psychosocial Discharge (Final Psychosocial Re-Evaluation):  Psychosocial Re-Evaluation - 04/30/24 1411       Psychosocial Re-Evaluation   Current issues with None Identified    Interventions Encouraged to attend Cardiac Rehabilitation for the exercise    Continue Psychosocial Services  No Follow up required          Vocational Rehabilitation: Provide vocational rehab assistance to qualifying candidates.   Vocational Rehab Evaluation & Intervention:  Vocational Rehab - 04/18/24 1308       Initial Vocational Rehab Evaluation & Intervention   Assessment shows need for Vocational Rehabilitation No   Jeffery Graves is retired and does not need vocational rehab at this time         Education: Education Goals: Education classes will be provided on a variety of topics geared toward better understanding of heart health and risk factor  modification. Participant will state understanding/return demonstration of topics presented as noted by education test scores.  Learning Barriers/Preferences:  Learning Barriers/Preferences - 04/18/24 1341       Learning Barriers/Preferences   Learning Barriers Language   He is unable to read and needs individual attention   Learning Preferences Audio;Group Instruction;Individual Instruction;Pictoral;Verbal Instruction          General Cardiac Education Topics:  AED/CPR: - Group verbal and written instruction with the use of models to demonstrate the basic use of the AED with the basic ABC's of resuscitation.   Test and Procedures: - Group verbal and visual presentation and models provide information about basic cardiac anatomy and function. Reviews the testing methods done to diagnose heart disease and the outcomes of the test results. Describes the treatment choices: Medical Management, Angioplasty, or Coronary Bypass Surgery for treating various heart conditions including Myocardial Infarction, Angina, Valve Disease, and Cardiac Arrhythmias. Written material provided at class time.   Medication Safety: - Group verbal and visual instruction to review commonly prescribed medications for heart and lung disease. Reviews the medication, class of the drug, and side effects. Includes the steps to properly store meds and maintain the prescription regimen. Written material provided at class time.   Intimacy: - Group verbal instruction through game format to discuss how heart and lung disease can affect sexual intimacy. Written material provided at class time.   Know Your Numbers and Heart Failure: - Group verbal and visual instruction to discuss disease risk factors for cardiac and pulmonary disease and treatment options.  Reviews associated critical values for Overweight/Obesity, Hypertension, Cholesterol, and Diabetes.  Discusses basics of heart failure: signs/symptoms and treatments.   Introduces Heart Failure Zone chart for action plan for heart failure. Written material provided at class time.   Infection Prevention: - Provides verbal and written material to individual with discussion of infection control including proper hand washing and proper equipment cleaning during exercise session.   Falls Prevention: - Provides verbal and written material to individual with discussion of falls prevention and safety.   Other: -Provides group and verbal instruction on various topics (see comments)   Knowledge Questionnaire Score:  Knowledge Questionnaire Score - 04/18/24 0900  Knowledge Questionnaire Score   Pre Score 21/24          Core Components/Risk Factors/Patient Goals at Admission:  Personal Goals and Risk Factors at Admission - 04/18/24 1019       Core Components/Risk Factors/Patient Goals on Admission    Weight Management Yes;Weight Maintenance    Expected Outcomes Weight Maintenance: Understanding of the daily nutrition guidelines, which includes 25-35% calories from fat, 7% or less cal from saturated fats, less than 200mg  cholesterol, less than 1.5gm of sodium, & 5 or more servings of fruits and vegetables daily;Understanding recommendations for meals to include 15-35% energy as protein, 25-35% energy from fat, 35-60% energy from carbohydrates, less than 200mg  of dietary cholesterol, 20-35 gm of total fiber daily;Understanding of distribution of calorie intake throughout the day with the consumption of 4-5 meals/snacks    Heart Failure Yes    Intervention Provide a combined exercise and nutrition program that is supplemented with education, support and counseling about heart failure. Directed toward relieving symptoms such as shortness of breath, decreased exercise tolerance, and extremity edema.    Expected Outcomes Improve functional capacity of life;Short term: Attendance in program 2-3 days a week with increased exercise capacity. Reported lower sodium  intake. Reported increased fruit and vegetable intake. Reports medication compliance.;Short term: Daily weights obtained and reported for increase. Utilizing diuretic protocols set by physician.;Long term: Adoption of self-care skills and reduction of barriers for early signs and symptoms recognition and intervention leading to self-care maintenance.    Hypertension Yes    Intervention Provide education on lifestyle modifcations including regular physical activity/exercise, weight management, moderate sodium restriction and increased consumption of fresh fruit, vegetables, and low fat dairy, alcohol moderation, and smoking cessation.;Monitor prescription use compliance.    Expected Outcomes Short Term: Continued assessment and intervention until BP is < 140/35mm HG in hypertensive participants. < 130/15mm HG in hypertensive participants with diabetes, heart failure or chronic kidney disease.;Long Term: Maintenance of blood pressure at goal levels.    Lipids Yes    Intervention Provide education and support for participant on nutrition & aerobic/resistive exercise along with prescribed medications to achieve LDL 70mg , HDL >40mg .    Expected Outcomes Short Term: Participant states understanding of desired cholesterol values and is compliant with medications prescribed. Participant is following exercise prescription and nutrition guidelines.;Long Term: Cholesterol controlled with medications as prescribed, with individualized exercise RX and with personalized nutrition plan. Value goals: LDL < 70mg , HDL > 40 mg.    Personal Goal Other Yes    Personal Goal He wants to feel back to normal, know limits to exercise, get back to the gym, strength    Intervention Will continue to monitor pt and progressworkloads as tolerated without sign or symptom    Expected Outcomes Will continue to monitor pt and progress workloads as tolerated          Education:Diabetes - Individual verbal and written instruction to  review signs/symptoms of diabetes, desired ranges of glucose level fasting, after meals and with exercise. Acknowledge that pre and post exercise glucose checks will be done for 3 sessions at entry of program.   Core Components/Risk Factors/Patient Goals Review:   Goals and Risk Factor Review     Row Name 04/30/24 1412             Core Components/Risk Factors/Patient Goals Review   Personal Goals Review Weight Management/Obesity;Heart Failure;Hypertension;Lipids       Review Jeffery Graves is doing well with exercise at cardiac rehab since his 04/22/24  start. Vital signs and CBG's have been stable, MET levels consistent with orientation MET levels.       Expected Outcomes Jeffery Graves will continue to particpate in cardiac rehab for exercise nutriton and lifestyle modifications.          Core Components/Risk Factors/Patient Goals at Discharge (Final Review):   Goals and Risk Factor Review - 04/30/24 1412       Core Components/Risk Factors/Patient Goals Review   Personal Goals Review Weight Management/Obesity;Heart Failure;Hypertension;Lipids    Review Jeffery Graves is doing well with exercise at cardiac rehab since his 04/22/24 start. Vital signs and CBG's have been stable, MET levels consistent with orientation MET levels.    Expected Outcomes Jeffery Graves will continue to particpate in cardiac rehab for exercise nutriton and lifestyle modifications.          ITP Comments:  ITP Comments     Row Name 04/18/24 [phone] 04/22/24 0835 04/30/24 1411       ITP Comments Dr. Wilbert Bihari medical director. Introduction to pritikin education/intensive cardiac rehab. Initial orientation packet reviewed with patient. 30 Day ITP Review. Jeffery Graves started cardiac rehab on 04/22/24 and did well with exercise 30 Day ITP Review. Jeffery Graves is doing well with exercise at cardiac rehab thus far        Comments: see ITP comments

## 2024-05-08 ENCOUNTER — Encounter (HOSPITAL_COMMUNITY)
Admission: RE | Admit: 2024-05-08 | Discharge: 2024-05-08 | Disposition: A | Discharge status: Home / Self Care | Source: Ambulatory Visit | Attending: Internal Medicine | Admitting: Internal Medicine

## 2024-05-08 DIAGNOSIS — I213 ST elevation (STEMI) myocardial infarction of unspecified site: Secondary | ICD-10-CM

## 2024-05-08 DIAGNOSIS — Z955 Presence of coronary angioplasty implant and graft: Secondary | ICD-10-CM

## 2024-05-10 ENCOUNTER — Encounter (HOSPITAL_COMMUNITY)

## 2024-05-13 ENCOUNTER — Other Ambulatory Visit: Payer: Self-pay

## 2024-05-13 ENCOUNTER — Encounter (HOSPITAL_COMMUNITY)
Admission: RE | Admit: 2024-05-13 | Discharge: 2024-05-13 | Disposition: A | Discharge status: Home / Self Care | Source: Ambulatory Visit | Attending: Internal Medicine | Admitting: Internal Medicine

## 2024-05-13 ENCOUNTER — Encounter (HOSPITAL_COMMUNITY)

## 2024-05-13 ENCOUNTER — Other Ambulatory Visit: Payer: Self-pay | Admitting: Family Medicine

## 2024-05-13 DIAGNOSIS — H11031 Double pterygium of right eye: Secondary | ICD-10-CM | POA: Diagnosis not present

## 2024-05-13 DIAGNOSIS — I213 ST elevation (STEMI) myocardial infarction of unspecified site: Secondary | ICD-10-CM

## 2024-05-13 DIAGNOSIS — Z955 Presence of coronary angioplasty implant and graft: Secondary | ICD-10-CM | POA: Diagnosis not present

## 2024-05-13 DIAGNOSIS — H2513 Age-related nuclear cataract, bilateral: Secondary | ICD-10-CM | POA: Diagnosis not present

## 2024-05-13 DIAGNOSIS — H25811 Combined forms of age-related cataract, right eye: Secondary | ICD-10-CM | POA: Diagnosis not present

## 2024-05-13 DIAGNOSIS — H52213 Irregular astigmatism, bilateral: Secondary | ICD-10-CM | POA: Diagnosis not present

## 2024-05-13 DIAGNOSIS — H31093 Other chorioretinal scars, bilateral: Secondary | ICD-10-CM | POA: Diagnosis not present

## 2024-05-13 NOTE — Telephone Encounter (Signed)
 Requested medications are due for refill today.  unsure  Requested medications are on the active medications list.  yes  Last refill. 03/01/2024 #10 0 rf  Future visit scheduled.   no  Notes to clinic.  Pt was to take this to prevent constipation while taking pain medications. Please review for refill.    Requested Prescriptions  Pending Prescriptions Disp Refills   senna-docusate (STOOL SOFTENER/LAXATIVE) 8.6-50 MG tablet 10 tablet 0    Sig: Take 1 tablet by mouth 2 (two) times daily. While taking pain meds to prevent constipation     Over the Counter:  OTC Passed - 05/13/2024  1:34 PM      Passed - Valid encounter within last 12 months    Recent Outpatient Visits           3 weeks ago Primary hypertension   [facility] Of [place]. [facility] Theotis Haze ORN, NP   3 months ago Screening for diabetes mellitus   [facility] - [facility] Of [place]. [facility] Delbert Clam, MD   9 months ago Benign prostatic hyperplasia without lower urinary tract symptoms   [facility] [place] [facility] Of [place]. [facility] Delbert Clam, MD   1 year ago Pterygium of right eye   [facility] Of [place]. [facility] Delbert Clam, MD   1 year ago Essential hypertension    [facility] [place]. [facility] Delbert Clam, MD       Future Appointments             In 3 months Delbert Clam, MD [facility] - A Dept Of [place]. [facility] [place]

## 2024-05-15 ENCOUNTER — Ambulatory Visit (HOSPITAL_COMMUNITY)
Admission: EM | Admit: 2024-05-15 | Discharge: 2024-05-15 | Disposition: A | Discharge status: Home / Self Care | Attending: Emergency Medicine | Admitting: Emergency Medicine

## 2024-05-15 ENCOUNTER — Encounter (HOSPITAL_COMMUNITY): Payer: Self-pay

## 2024-05-15 ENCOUNTER — Encounter (HOSPITAL_COMMUNITY)
Admission: RE | Admit: 2024-05-15 | Discharge: 2024-05-15 | Disposition: A | Source: Ambulatory Visit | Attending: Internal Medicine

## 2024-05-15 DIAGNOSIS — Z955 Presence of coronary angioplasty implant and graft: Secondary | ICD-10-CM

## 2024-05-15 DIAGNOSIS — I213 ST elevation (STEMI) myocardial infarction of unspecified site: Secondary | ICD-10-CM

## 2024-05-15 DIAGNOSIS — R21 Rash and other nonspecific skin eruption: Secondary | ICD-10-CM | POA: Diagnosis not present

## 2024-05-15 NOTE — Discharge Instructions (Signed)
 I have provided you an ambulatory referral to dermatology to further evaluate this rash. Keep the area clean and dry and clean with mild soap. Avoid applying hydrocortisone to the area as I do not believe this is helping. If you do not hear from dermatology within the week you can give them a call to schedule an appointment as soon as possible for further evaluation of this.

## 2024-05-15 NOTE — ED Triage Notes (Signed)
 Patient has been using Hydrocortisone cream daily.

## 2024-05-15 NOTE — Progress Notes (Signed)
 Pt c/o exacerbation of rash on abdomen and on arms.  Rash is known to pt's PCP (see 04/16/24 note) and Heart Failure team see (04/08/24 note). Upon examination, rash appeared bilateral, white, noninflamed.  Pt denies rash being painful and denies s/s of fever.  Lum Louis, NP examined pt also, ruled out shingles, and cleared pt to continue exercise today.

## 2024-05-15 NOTE — ED Triage Notes (Signed)
 Patient presents with a rash on his abdomen area x 3 weeks.

## 2024-05-16 ENCOUNTER — Other Ambulatory Visit: Payer: Self-pay

## 2024-05-16 NOTE — Progress Notes (Signed)
 CARDIAC REHAB PHASE 2  Reviewed home exercise with pt today. Pt is tolerating exercise well. Pt will continue to exercise on his own by going to [employer] [facility] for 30-45 minutes per session 2 days a week in addition to the 3 days in CRP2. Advised pt on THRR, RPE scale, hydration and temperature/humidity precautions. Reinforced NTG use, S/S to stop exercise and when to call MD vs 911. Encouraged warm up cool down and stretches with exercise sessions. Pt verbalized understanding, all questions were answered and pt was given a copy to take home.    Alec GORMAN Finder ACSM-CEP 05/16/2024 8:11 AM

## 2024-05-16 NOTE — ED Provider Notes (Signed)
 [facility]    CSN: [id number] Arrival date & time: 05/15/24  1514      History   Chief Complaint Chief Complaint  Patient presents with   Rash    HPI Jeffery Graves is a 68 y.o. male.   Patient presents with concerns for rash to his abdomen that is spread towards his armpits that began about 3 weeks ago.  Patient states that he has been applying hydrocortisone cream without relief.  Patient states that the rash is not itchy nor painful.  Patient denies any new foods, medications, or products.  The history is provided by the patient and medical records.  Rash   Past Medical History:  Diagnosis Date   BPH (benign prostatic hyperplasia)    Coronary artery disease    Hx of radiation therapy 09/06/10 - 10/29/10   prostate, seminal vesicles   Hypertension    on meds   Prostate cancer (HCC) 04/26/2010   gleason 7, vol 20.88 cc    Patient Active Problem List   Diagnosis Date Noted   Ischemic cardiomyopathy 03/18/2024   Hypokalemia 03/18/2024   Cardiac arrest (HCC) 03/16/2024   STEMI (ST elevation myocardial infarction) (HCC) 03/15/2024   Prostate cancer (HCC) 03/06/2024   Vitamin D  deficiency 03/13/2014   BPH (benign prostatic hyperplasia) 10/04/2013   DENTAL CARIES 02/10/2010   ERECTILE DYSFUNCTION, SECONDARY TO MEDICATION 02/26/2009   HYPERTENSION, BENIGN ESSENTIAL 08/02/2007    Past Surgical History:  Procedure Laterality Date   BIOPSY PROSTATE     CARDIAC CATHETERIZATION     CORONARY/GRAFT ACUTE MI REVASCULARIZATION N/A 03/15/2024   Procedure: Coronary/Graft Acute MI Revascularization;  Surgeon: Elmira Newman PARAS, MD;  [facility];  Service: Cardiovascular;  Laterality: N/A;   CRYOABLATION N/A 03/06/2024   Procedure: CRYOABLATION, PROSTATE, CYSTOSCOPY;  Surgeon: Alvaro Ricardo KATHEE Mickey., MD;  Location: [facility];  Service: Urology;  Laterality: N/A;       Home Medications    Prior to Admission medications   Medication Sig Start  Date End Date Taking? Authorizing Provider  aspirin  81 MG chewable tablet Chew 1 tablet (81 mg total) by mouth daily. 03/25/24  Yes Colletta Manuelita Garre, PA-C  atorvastatin  (LIPITOR ) 80 MG tablet Take 1 tablet (80 mg total) by mouth daily. 03/25/24  Yes Colletta Manuelita Garre, PA-C  Brimonidine  Tartrate (LUMIFY  OP) Place 1 drop into both eyes daily.   Yes [provider]  clopidogrel  (PLAVIX ) 75 MG tablet Take 1 tablet (75 mg total) by mouth daily. 03/25/24  Yes Colletta Manuelita Garre, PA-C  dapagliflozin  propanediol (FARXIGA ) 10 MG TABS tablet Take 1 tablet (10 mg total) by mouth daily before breakfast. 04/08/24  Yes [place], Harlene HERO, FNP  ENTRESTO  97-103 MG Take 1 tablet by mouth 2 (two) times daily. 03/25/24  Yes Colletta Manuelita Garre, PA-C  metoprolol  succinate (TOPROL -XL) 25 MG 24 hr tablet Take 1 tablet (25 mg total) by mouth daily. 03/25/24  Yes Colletta Manuelita Garre, PA-C  nitroGLYCERIN  (NITROSTAT ) 0.4 MG SL tablet Place 1 tablet (0.4 mg total) under the tongue every 5 (five) minutes as needed for chest pain. 04/26/24  Yes [place]ford, Harlene HERO, FNP  senna-docusate (SENOKOT-S) 8.6-50 MG tablet Take 1 tablet by mouth 2 (two) times daily. While taking pain meds to prevent constipation 03/07/24  Yes [name], Ricardo KATHEE Mickey., MD  spironolactone  (ALDACTONE ) 25 MG tablet Take 1 tablet (25 mg total) by mouth daily. 03/25/24  Yes Colletta Manuelita Garre, PA-C  tamsulosin  (FLOMAX ) 0.4 MG CAPS capsule  Take 1 capsule (0.4 mg total) by mouth 2 (two) times daily. 06/20/23  Yes   XTANDI 80 MG tablet Take 80 mg by mouth daily. 02/19/24  Yes [provider]  potassium chloride  SA (KLOR-CON  M) 20 MEQ tablet Take 1 tablet (20 mEq total) by mouth daily for 10 days. Patient not taking: Reported on 04/18/2024 03/19/24 04/08/24  Darci Pore, MD    Family History Family History  Problem Relation Age of Onset   Stroke Mother    Colon cancer Neg Hx    Colon polyps Neg Hx    Esophageal cancer Neg Hx     Stomach cancer Neg Hx    Rectal cancer Neg Hx     Social History Social History   Tobacco Use   Smoking status: Never   Smokeless tobacco: Never  Vaping Use   Vaping status: Never Used  Substance Use Topics   Alcohol use: Never    Alcohol/week: 7.0 standard drinks of alcohol    Types: 7 Standard drinks or equivalent per week   Drug use: No     Allergies   Patient has no known allergies.   Review of Systems Review of Systems  Skin:  Positive for rash.   Per HPI  Physical Exam Triage Vital Signs ED Triage Vitals  Encounter Vitals Group     BP 05/15/24 1621 (!) 141/82     Girls Systolic BP Percentile --      Girls Diastolic BP Percentile --      Boys Systolic BP Percentile --      Boys Diastolic BP Percentile --      Pulse Rate 05/15/24 1618 61     Resp 05/15/24 1618 20     Temp 05/15/24 1618 98.1 F (36.7 C)     Temp Source 05/15/24 1618 Oral     SpO2 05/15/24 1618 98 %     Weight --      Height --      Head Circumference --      Peak Flow --      Pain Score --      Pain Loc --      Pain Education --      Exclude from Growth Chart --    No data found.  Updated Vital Signs BP (!) 141/82   Pulse 61   Temp 98.1 F (36.7 C) (Oral)   Resp 20   SpO2 98%   Visual Acuity Right Eye Distance:   Left Eye Distance:   Bilateral Distance:    Right Eye Near:   Left Eye Near:    Bilateral Near:     Physical Exam Vitals and nursing note reviewed.  Constitutional:      General: He is awake. He is not in acute distress.    Appearance: Normal appearance. He is well-developed and well-groomed. He is not ill-appearing.  Skin:    General: Skin is warm and dry.     Findings: Rash present. Rash is papular.     Comments: Papular rash noted to abdominal wall extending into bilateral axillas.  Some areas of the rash appear like dry skin tags  Neurological:     Mental Status: He is alert.  Psychiatric:        Behavior: Behavior is cooperative.            UC Treatments / Results  Labs (all labs ordered are listed, but only abnormal results are displayed) Labs Reviewed - No data to display  EKG   Radiology No results found.  Procedures Procedures (including critical care time)  Medications Ordered in UC Medications - No data to display  Initial Impression / Assessment and Plan / UC Course  I have reviewed the triage vital signs and the nursing notes.  Pertinent labs & imaging results that were available during my care of the patient were reviewed by me and considered in my medical decision making (see chart for details).     Patient is overall well-appearing.  Vitals are stable. Rash does not appear to be fungal, bacterial, or inflammatory.  Recommended stopping hydrocortisone as this does not seem to be helping.  Recommended keeping the skin clean and dry for now.  Given ambulatory referral to dermatology for further evaluation.  Discussed follow-up and return precautions. Final Clinical Impressions(s) / UC Diagnoses   Final diagnoses:  Rash and nonspecific skin eruption     Discharge Instructions      I have provided you an ambulatory referral to dermatology to further evaluate this rash. Keep the area clean and dry and clean with mild soap. Avoid applying hydrocortisone to the area as I do not believe this is helping. If you do not hear from dermatology within the week you can give them a call to schedule an appointment as soon as possible for further evaluation of this.   ED Prescriptions   None    PDMP not reviewed this encounter.   Johnie Flaming A, NP 05/16/24 [phone]

## 2024-05-17 ENCOUNTER — Encounter (HOSPITAL_COMMUNITY)

## 2024-05-20 ENCOUNTER — Encounter (HOSPITAL_COMMUNITY)

## 2024-05-20 ENCOUNTER — Encounter (HOSPITAL_COMMUNITY)
Admission: RE | Admit: 2024-05-20 | Discharge: 2024-05-20 | Disposition: A | Discharge status: Home / Self Care | Source: Ambulatory Visit | Attending: Internal Medicine | Admitting: Internal Medicine

## 2024-05-20 DIAGNOSIS — Z955 Presence of coronary angioplasty implant and graft: Secondary | ICD-10-CM

## 2024-05-20 DIAGNOSIS — I213 ST elevation (STEMI) myocardial infarction of unspecified site: Secondary | ICD-10-CM

## 2024-05-22 ENCOUNTER — Encounter (HOSPITAL_COMMUNITY)
Admission: RE | Admit: 2024-05-22 | Discharge: 2024-05-22 | Disposition: A | Source: Ambulatory Visit | Attending: Internal Medicine

## 2024-05-22 DIAGNOSIS — Z955 Presence of coronary angioplasty implant and graft: Secondary | ICD-10-CM | POA: Diagnosis not present

## 2024-05-22 DIAGNOSIS — I213 ST elevation (STEMI) myocardial infarction of unspecified site: Secondary | ICD-10-CM

## 2024-05-24 ENCOUNTER — Encounter (HOSPITAL_COMMUNITY)

## 2024-05-27 ENCOUNTER — Encounter (HOSPITAL_COMMUNITY)

## 2024-05-27 ENCOUNTER — Other Ambulatory Visit: Payer: Self-pay

## 2024-05-27 ENCOUNTER — Encounter (HOSPITAL_COMMUNITY)
Admission: RE | Admit: 2024-05-27 | Discharge: 2024-05-27 | Disposition: A | Discharge status: Home / Self Care | Source: Ambulatory Visit | Attending: Internal Medicine | Admitting: Internal Medicine

## 2024-05-27 DIAGNOSIS — I213 ST elevation (STEMI) myocardial infarction of unspecified site: Secondary | ICD-10-CM

## 2024-05-27 DIAGNOSIS — Z955 Presence of coronary angioplasty implant and graft: Secondary | ICD-10-CM

## 2024-05-29 ENCOUNTER — Encounter (HOSPITAL_COMMUNITY)
Admission: RE | Admit: 2024-05-29 | Discharge: 2024-05-29 | Disposition: A | Source: Ambulatory Visit | Attending: Internal Medicine

## 2024-05-29 DIAGNOSIS — I213 ST elevation (STEMI) myocardial infarction of unspecified site: Secondary | ICD-10-CM

## 2024-05-29 DIAGNOSIS — Z955 Presence of coronary angioplasty implant and graft: Secondary | ICD-10-CM | POA: Diagnosis not present

## 2024-05-30 ENCOUNTER — Other Ambulatory Visit: Payer: Self-pay

## 2024-05-30 ENCOUNTER — Encounter: Payer: Self-pay | Admitting: Internal Medicine

## 2024-05-30 ENCOUNTER — Ambulatory Visit: Attending: Internal Medicine | Admitting: Internal Medicine

## 2024-05-30 VITALS — BP 120/80 | HR 60 | Temp 98.5°F | Ht 66.0 in | Wt 162.0 lb

## 2024-05-30 DIAGNOSIS — L309 Dermatitis, unspecified: Secondary | ICD-10-CM

## 2024-05-30 MED ORDER — TRIAMCINOLONE ACETONIDE 0.1 % EX CREA
1.0000 | TOPICAL_CREAM | Freq: Two times a day (BID) | CUTANEOUS | 0 refills | Status: DC
  Filled 2024-05-30: qty 30, 15d supply, fill #0

## 2024-05-30 NOTE — Progress Notes (Signed)
 Patient ID: [name], male    DOB: [birth year]  MRN: [id number]  CC: Rash   Subjective: Jeffery Graves is a 68 y.o. male who presents for UC. His concerns today include:  Hx of HTN, CAD, prostate CA  Discussed the use of AI scribe software for clinical note transcription with the patient, who gave verbal consent to proceed.  History of Present Illness Jeffery Graves is a 68 year old male who presents with a rash on the lower abdomen and underarms.  He has had a rash for approximately two and a half to three months, located on the lower abdomen, under the arms, and possibly on the back. The rash is more pronounced in the mornings, appearing puffy. The patient reports that some areas look like he is healing. No associated itching, fever, or night sweats.  The onset of the rash coincided with the initiation of new medications following a hospitalization for a heart attack in August. The medications include Farxiga , atorvastatin , Entresto , metoprolol , and spironolactone .   He lives alone and denies any new exposures to animals, plants, or body products that could have contributed to the rash. He has switched to a different laundry detergent, specifically mentioning a change from cheaper products to a brand called 'Aisle'. He has an outdoor cat that he feeds but does not allow inside.  He has attempted to manage the rash with over-the-counter creams, but cannot recall the specific names of these products. He has not used any deodorants or other products on the affected areas.    Patient Active Problem List   Diagnosis Date Noted   Ischemic cardiomyopathy 03/18/2024   Hypokalemia 03/18/2024   Cardiac arrest (HCC) 03/16/2024   STEMI (ST elevation myocardial infarction) (HCC) 03/15/2024   Prostate cancer (HCC) 03/06/2024   Vitamin D  deficiency 03/13/2014   BPH (benign prostatic hyperplasia) 10/04/2013   DENTAL CARIES 02/10/2010   ERECTILE DYSFUNCTION, SECONDARY TO MEDICATION 02/26/2009    HYPERTENSION, BENIGN ESSENTIAL 08/02/2007     Current Outpatient Medications on File Prior to Visit  Medication Sig Dispense Refill   aspirin  81 MG chewable tablet Chew 1 tablet (81 mg total) by mouth daily. 90 tablet 2   atorvastatin  (LIPITOR ) 80 MG tablet Take 1 tablet (80 mg total) by mouth daily. 90 tablet 2   Brimonidine  Tartrate (LUMIFY  OP) Place 1 drop into both eyes daily.     clopidogrel  (PLAVIX ) 75 MG tablet Take 1 tablet (75 mg total) by mouth daily. 90 tablet 2   dapagliflozin  propanediol (FARXIGA ) 10 MG TABS tablet Take 1 tablet (10 mg total) by mouth daily before breakfast. 30 tablet 6   ENTRESTO  97-103 MG Take 1 tablet by mouth 2 (two) times daily. 90 tablet 2   metoprolol  succinate (TOPROL -XL) 25 MG 24 hr tablet Take 1 tablet (25 mg total) by mouth daily. 90 tablet 2   nitroGLYCERIN  (NITROSTAT ) 0.4 MG SL tablet Place 1 tablet (0.4 mg total) under the tongue every 5 (five) minutes as needed for chest pain. 25 tablet 3   potassium chloride  SA (KLOR-CON  M) 20 MEQ tablet Take 1 tablet (20 mEq total) by mouth daily for 10 days. (Patient not taking: Reported on 04/18/2024) 10 tablet 0   senna-docusate (SENOKOT-S) 8.6-50 MG tablet Take 1 tablet by mouth 2 (two) times daily. While taking pain meds to prevent constipation 10 tablet 0   spironolactone  (ALDACTONE ) 25 MG tablet Take 1 tablet (25 mg total) by mouth daily. 90 tablet 2   tamsulosin  (  FLOMAX ) 0.4 MG CAPS capsule Take 1 capsule (0.4 mg total) by mouth 2 (two) times daily. 60 capsule 11   XTANDI 80 MG tablet Take 80 mg by mouth daily.     No current facility-administered medications on file prior to visit.    No Known Allergies  Social History   Socioeconomic History   Marital status: Single    Spouse name: Not on file   Number of children: Not on file   Years of education: Not on file   Highest education level: Patient refused  Occupational History   Occupation: Retired  Tobacco Use   Smoking status: Never    Smokeless tobacco: Never  Vaping Use   Vaping status: Never Used  Substance and Sexual Activity   Alcohol use: Never    Alcohol/week: 7.0 standard drinks of alcohol    Types: 7 Standard drinks or equivalent per week   Drug use: No   Sexual activity: Not Currently  Other Topics Concern   Not on file  Social History Narrative   Not on file   Social Drivers of Health   Financial Resource Strain: Not on file  Food Insecurity: No Food Insecurity (03/17/2024)   Hunger Vital Sign    Worried About Running Out of Food in the Last Year: Never true    Ran Out of Food in the Last Year: Never true  Transportation Needs: No Transportation Needs (03/17/2024)   PRAPARE - Administrator, Civil Service (Medical): No    Lack of Transportation (Non-Medical): No  Physical Activity: Not on file  Stress: Not on file  Social Connections: Unknown (03/17/2024)   Social Connection and Isolation Panel    Frequency of Communication with Friends and Family: Patient declined    Frequency of Social Gatherings with Friends and Family: Patient declined    Attends Religious Services: Patient declined    Database Administrator or Organizations: Not on file    Attends Banker Meetings: Not on file    Marital Status: Patient declined  Intimate Partner Violence: Not At Risk (03/17/2024)   Humiliation, Afraid, Rape, and Kick questionnaire    Fear of Current or Ex-Partner: No    Emotionally Abused: No    Physically Abused: No    Sexually Abused: No    Family History  Problem Relation Age of Onset   Stroke Mother    Colon cancer Neg Hx    Colon polyps Neg Hx    Esophageal cancer Neg Hx    Stomach cancer Neg Hx    Rectal cancer Neg Hx     Past Surgical History:  Procedure Laterality Date   BIOPSY PROSTATE     CARDIAC CATHETERIZATION     CORONARY/GRAFT ACUTE MI REVASCULARIZATION N/A 03/15/2024   Procedure: Coronary/Graft Acute MI Revascularization;  Surgeon: Elmira Newman PARAS,  MD;  [facility];  Service: Cardiovascular;  Laterality: N/A;   CRYOABLATION N/A 03/06/2024   Procedure: CRYOABLATION, PROSTATE, CYSTOSCOPY;  Surgeon: Alvaro Ricardo KATHEE Mickey., MD;  Location: [facility];  Service: Urology;  Laterality: N/A;    ROS: Review of Systems Negative except as stated above  PHYSICAL EXAM: BP 120/80 (Patient Position: Sitting, Cuff Size: Normal)   Pulse 60   Temp 98.5 F (36.9 C) (Oral)   Ht 5' 6 (1.676 m)   Wt 162 lb (73.5 kg)   SpO2 99%   BMI 26.15 kg/m   Physical Exam   General appearance - alert, well appearing, older  AAM and in no distress Mental status - normal mood, behavior, speech, dress, motor activity, and thought processes Skin - slightly hyperpigment fine papular rash noted over lower abdomen and below axilla BL and posterior to axilla     Latest Ref Rng & Units 04/29/2024    8:40 AM 04/08/2024    9:31 AM 03/25/2024    2:25 PM  CMP  Glucose 70 - 99 mg/dL 899  79  98   BUN 8 - 23 mg/dL 12  12  10    Creatinine 0.61 - 1.24 mg/dL 9.04  9.02  9.16   Sodium 135 - 145 mmol/L 135  138  135   Potassium 3.5 - 5.1 mmol/L 4.1  3.4  4.2   Chloride 98 - 111 mmol/L 104  105  105   CO2 22 - 32 mmol/L 23  24  24    Calcium  8.9 - 10.3 mg/dL 8.9  8.3  8.9    Lipid Panel     Component Value Date/Time   CHOL 164 03/15/2024 1139   CHOL 199 12/27/2022 0920   TRIG 39 03/15/2024 1139   HDL 52 03/15/2024 1139   HDL 87 12/27/2022 0920   CHOLHDL 3.2 03/15/2024 1139   VLDL 8 03/15/2024 1139   LDLCALC 104 (H) 03/15/2024 1139   LDLCALC 102 (H) 12/27/2022 0920    CBC    Component Value Date/Time   WBC 9.0 03/19/2024 0209   RBC 4.20 (L) 03/19/2024 0209   HGB 11.3 (L) 03/19/2024 0209   HGB 13.5 02/12/2024 0931   HCT 34.2 (L) 03/19/2024 0209   HCT 40.6 02/12/2024 0931   PLT 189 03/19/2024 0209   PLT 187 02/12/2024 0931   MCV 81.4 03/19/2024 0209   MCV 82 02/12/2024 0931   MCH 26.9 03/19/2024 0209   MCHC 33.0 03/19/2024 0209   RDW 13.8  03/19/2024 0209   RDW 13.3 02/12/2024 0931   LYMPHSABS 2.9 03/15/2024 1139   LYMPHSABS 1.6 02/12/2024 0931   MONOABS 1.0 03/15/2024 1139   EOSABS 0.2 03/15/2024 1139   EOSABS 0.1 02/12/2024 0931   BASOSABS 0.1 03/15/2024 1139   BASOSABS 0.0 02/12/2024 0931    ASSESSMENT AND PLAN:  Assessment and Plan Assessment & Plan Dermatitis Chronic rash on lower abdomen, underarms, and possibly back. Questionable etiology. Rash does not itch and started after being placed on several meds post MI.  If it is medication induced, it is difficult to tease out which 1 may be causing it.  Will give him a trial of triamcinolone cream.  Offered screening for HIV and syphilis but patient declined..    Patient was given the opportunity to ask questions.  Patient verbalized understanding of the plan and was able to repeat key elements of the plan.   This documentation was completed using Paediatric nurse.  Any transcriptional errors are unintentional.  No orders of the defined types were placed in this encounter.    Requested Prescriptions   Signed Prescriptions Disp Refills   triamcinolone cream (KENALOG) 0.1 % 30 g 0    Sig: Apply 1 Application topically 2 (two) times daily.    Return if symptoms worsen or fail to improve.  Barnie Louder, MD, FACP

## 2024-05-30 NOTE — Patient Instructions (Signed)
  VISIT SUMMARY: Today, you were seen for a rash that has been present on your lower abdomen, underarms, and possibly your back for the past two and a half to three months. This rash started after you began new medications following your heart attack in August. You have tried over-the-counter creams without much success.  YOUR PLAN: -DERMATITIS: Dermatitis is a general term for inflammation of the skin. Your rash may be related to the new medications you started after your heart attack. We have prescribed triamcinolone cream to be applied twice daily for seven days. If there is no improvement, you should stop using the cream. We have also taken photographs of the rash for your medical records and discussed the possibility that your medications could be causing this reaction. Additionally, we offered to screen you for HIV and syphilis if you consent.  INSTRUCTIONS: Please follow up with the dermatologist as scheduled in May. If your rash does not improve with the triamcinolone cream, discontinue its use and inform us . If you consent to HIV and syphilis screening, please let us  know.                      Contains text generated by Abridge.                                 Contains text generated by Abridge.

## 2024-05-31 ENCOUNTER — Encounter (HOSPITAL_COMMUNITY)

## 2024-06-03 ENCOUNTER — Encounter (HOSPITAL_COMMUNITY)
Admission: RE | Admit: 2024-06-03 | Discharge: 2024-06-03 | Disposition: A | Discharge status: Home / Self Care | Source: Ambulatory Visit | Attending: Internal Medicine | Admitting: Internal Medicine

## 2024-06-03 ENCOUNTER — Encounter (HOSPITAL_COMMUNITY)

## 2024-06-03 DIAGNOSIS — Z48812 Encounter for surgical aftercare following surgery on the circulatory system: Secondary | ICD-10-CM | POA: Insufficient documentation

## 2024-06-03 DIAGNOSIS — I213 ST elevation (STEMI) myocardial infarction of unspecified site: Secondary | ICD-10-CM

## 2024-06-03 DIAGNOSIS — Z79899 Other long term (current) drug therapy: Secondary | ICD-10-CM | POA: Insufficient documentation

## 2024-06-03 DIAGNOSIS — I252 Old myocardial infarction: Secondary | ICD-10-CM | POA: Insufficient documentation

## 2024-06-03 DIAGNOSIS — I119 Hypertensive heart disease without heart failure: Secondary | ICD-10-CM | POA: Insufficient documentation

## 2024-06-03 DIAGNOSIS — Z955 Presence of coronary angioplasty implant and graft: Secondary | ICD-10-CM | POA: Insufficient documentation

## 2024-06-03 NOTE — Progress Notes (Signed)
 Cardiac Individual Treatment Plan  Patient Details  Name: Jeffery Graves MRN: [id number] Date of Birth: [birth year] Referring Provider:   Flowsheet Row INTENSIVE CARDIAC REHAB ORIENT from 04/18/2024 in [facility] for Heart, Vascular, & Lung Health  Referring Provider Dr. 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  03/15/24 STEMI  Patient's Home Medications on Admission:  Current Outpatient Medications:    aspirin  81 MG chewable tablet, Chew 1 tablet (81 mg total) by mouth daily., Disp: 90 tablet, Rfl: 2   atorvastatin  (LIPITOR ) 80 MG tablet, Take 1 tablet (80 mg total) by mouth daily., Disp: 90 tablet, Rfl: 2   Brimonidine  Tartrate (LUMIFY  OP), Place 1 drop into both eyes daily., Disp: , Rfl:    clopidogrel  (PLAVIX ) 75 MG tablet, Take 1 tablet (75 mg total) by mouth daily., Disp: 90 tablet, Rfl: 2   dapagliflozin  propanediol (FARXIGA ) 10 MG TABS tablet, Take 1 tablet (10 mg total) by mouth daily before breakfast., Disp: 30 tablet, Rfl: 6   ENTRESTO  97-103 MG, Take 1 tablet by mouth 2 (two) times daily., Disp: 90 tablet, Rfl: 2   metoprolol  succinate (TOPROL -XL) 25 MG 24 hr tablet, Take 1 tablet (25 mg total) by mouth daily., Disp: 90 tablet, Rfl: 2   nitroGLYCERIN  (NITROSTAT ) 0.4 MG SL tablet, Place 1 tablet (0.4 mg total) under the tongue every 5 (five) minutes as needed for chest pain., Disp: 25 tablet, Rfl: 3   potassium chloride  SA (KLOR-CON  M) 20 MEQ tablet, Take 1 tablet (20 mEq total) by mouth daily for 10 days. (Patient not taking: Reported on 04/18/2024), Disp: 10 tablet, Rfl: 0   senna-docusate (SENOKOT-S) 8.6-50 MG tablet, Take 1 tablet by mouth 2 (two) times daily. While taking pain meds to prevent constipation, Disp: 10 tablet, Rfl: 0   spironolactone  (ALDACTONE ) 25  MG tablet, Take 1 tablet (25 mg total) by mouth daily., Disp: 90 tablet, Rfl: 2   tamsulosin  (FLOMAX ) 0.4 MG CAPS capsule, Take 1 capsule (0.4 mg total) by mouth 2 (two) times daily., Disp: 60 capsule, Rfl: 11   triamcinolone cream (KENALOG) 0.1 %, Apply 1 Application topically 2 (two) times daily., Disp: 30 g, Rfl: 0   XTANDI 80 MG tablet, Take 80 mg by mouth daily., Disp: , Rfl:   Past Medical History: Past Medical History:  Diagnosis Date   BPH (benign prostatic hyperplasia)    Coronary artery disease    Hx of radiation therapy 09/06/10 - 10/29/10   prostate, seminal vesicles   Hypertension    on meds   Prostate cancer (HCC) 04/26/2010   gleason 7, vol 20.88 cc    Tobacco Use: Social History   Tobacco Use  Smoking Status Never  Smokeless Tobacco Never    Labs: Review Flowsheet  More data exists      Latest Ref Rng & Units 06/21/2022 12/27/2022 02/12/2024 03/15/2024 03/16/2024  Labs for ITP Cardiac and Pulmonary Rehab  Cholestrol 0 - 200 mg/dL - 800  - 835  -  LDL (calc) 0 - 99 mg/dL - 897  - 895  -  HDL-C >40 mg/dL - 87  - 52  -  Trlycerides <150 mg/dL - 56  - 39  -  Hemoglobin A1c 4.8 - 5.6 % 5.1  - 5.4  5.2  5.2  PH, Arterial 7.35 - 7.45 - - - 7.371  -  PCO2 arterial 32 - 48 mmHg - - - 37.9  -  Bicarbonate 20.0 - 28.0 mmol/L - - - 21.9  -  TCO2 22 - 32 mmol/L - - - 23  -  Acid-base deficit 0.0 - 2.0 mmol/L - - - 3.0  -  O2 Saturation % - - - 100  -    Capillary Blood Glucose: Lab Results  Component Value Date   GLUCAP 100 (H) 03/19/2024   GLUCAP 103 (H) 03/18/2024   GLUCAP 95 03/18/2024   GLUCAP 104 (H) 03/18/2024   GLUCAP 111 (H) 03/18/2024     Exercise Target Goals: Exercise Program Goal: Individual exercise prescription set using results from initial 6 min walk test and THRR while considering  patient's activity barriers and safety.   Exercise Prescription Goal: Initial exercise prescription builds to 30-45 minutes a day of aerobic activity, 2-3 days per  week.  Home exercise guidelines will be given to patient during program as part of exercise prescription that the participant will acknowledge.  Activity Barriers & Risk Stratification:  Activity Barriers & Cardiac Risk Stratification - 04/18/24 1015       Activity Barriers & Cardiac Risk Stratification   Activity Barriers None    Cardiac Risk Stratification High          6 Minute Walk:  6 Minute Walk     Row Name 04/18/24 1007         6 Minute Walk   Phase Initial     Distance 1390 feet     Walk Time 6 minutes     # of Rest Breaks 0     MPH 2.63     METS 3.41     RPE 8     Perceived Dyspnea  0     VO2 Peak 11.94     Symptoms No     Resting HR 70 bpm     Resting BP 102/62     Resting Oxygen Saturation  100 %     Exercise Oxygen Saturation  during 6 min walk 99 %     Max Ex. HR 114 bpm     Max Ex. BP 126/76     2 Minute Post BP 112/72        Oxygen Initial Assessment:   Oxygen Re-Evaluation:   Oxygen Discharge (Final Oxygen Re-Evaluation):   Initial Exercise Prescription:  Initial Exercise Prescription - 04/18/24 1000       Date of Initial Exercise RX and Referring Provider   Date 04/18/24    Referring Provider Dr. Toribio Fuel, MD    Expected Discharge Date 07/12/24      Treadmill   MPH 2.5    Grade 0    Minutes 15    METs 2.91      Recumbant Elliptical   Level 2    RPM 60    Watts 80    Minutes 15    METs 3.2      Prescription Details   Frequency (times per week) 2    Duration Progress to 30 minutes of continuous aerobic without signs/symptoms of physical distress      Intensity   THRR 40-80% of Max Heartrate 62-123    Ratings of Perceived Exertion 11-13    Perceived Dyspnea 0-4      Progression   Progression Continue progressive overload as per policy without signs/symptoms or physical distress.  Resistance Training   Training Prescription Yes    Weight 3 lbs    Reps 10-15          Perform Capillary Blood Glucose  checks as needed.  Exercise Prescription Changes:   Exercise Prescription Changes     Row Name 04/22/24 [phone] 05/15/24 0800 06/03/24 0831         Response to Exercise   Blood Pressure (Admit) 132/70 145/74 120/68     Blood Pressure (Exercise) 140/68 -- --     Blood Pressure (Exit) 110/60 108/70 112/66     Heart Rate (Admit) 61 bpm 93 bpm 66 bpm     Heart Rate (Exercise) 107 bpm 106 bpm 113 bpm     Heart Rate (Exit) 69 bpm 81 bpm 81 bpm     Rating of Perceived Exertion (Exercise) 8 6 9      Perceived Dyspnea (Exercise) 0 0 0     Symptoms none none none     Comments First day in the cardiac rehab [facility] program Reviewed MET's, goals and home ExRx Reviewed MET's     Duration Progress to 30 minutes of  aerobic without signs/symptoms of physical distress Progress to 30 minutes of  aerobic without signs/symptoms of physical distress Progress to 30 minutes of  aerobic without signs/symptoms of physical distress     Intensity THRR unchanged THRR unchanged THRR unchanged       Progression   Progression Continue to progress workloads to maintain intensity without signs/symptoms of physical distress. Continue to progress workloads to maintain intensity without signs/symptoms of physical distress. Continue to progress workloads to maintain intensity without signs/symptoms of physical distress.     Average METs 2.71 2.91 3.61       Resistance Training   Training Prescription Yes No No     Weight 3 lbs 3 lbs 5     Reps 10-15 10-15 10-15     Time 10 Minutes 10 Minutes 10 Minutes       Treadmill   MPH 2.5 3  stayed on the TM 30 Mins 3  stayed on the TM 30 Mins     Grade 0 0 1     Minutes 15 30 15      METs 2.91 3.3 3.71       Recumbant Elliptical   Level 2 -- 3     RPM 39 -- 64     Watts 50 -- 52     Minutes 15 -- 15     METs 2.5 -- 3.5       Home Exercise Plan   Plans to continue exercise at -- Home (comment) Home (comment)     Frequency -- Add 2 additional days to program exercise  sessions. Add 2 additional days to program exercise sessions.     Initial Home Exercises Provided -- 05/15/24 05/15/24        Exercise Comments:   Exercise Comments     Row Name 04/22/24 0845 05/15/24 0811 06/03/24 0833       Exercise Comments Pt first day in the [facility] ICR program. Pt tolerated exercise well with an average MET level of 2.71. He is off to a good start and is learning his THRR, RPE and ExRx REVD MET's, goals and home ExRx. Pt tolerated exercise well with an average MET level of 3.12. Heis doing well and progressing MET's. He has a rash today, onsite provider aware, he has an appointment later this month to be seen. He will continue to  exercise by going to [employer] 2 days for 30-45 mins per session. He feels good about his goals and is increasing strength. REVD MET's. Pt tolerated exercise well with an average MET level of 3.61. He is doing well and progressing MET's and WL's. He says he has been working out on his own and eating better.        Exercise Goals and Review:   Exercise Goals     Row Name 04/18/24 1018             Exercise Goals   Increase Physical Activity Yes       Intervention Provide advice, education, support and counseling about physical activity/exercise needs.;Develop an individualized exercise prescription for aerobic and resistive training based on initial evaluation findings, risk stratification, comorbidities and participant's personal goals.       Expected Outcomes Short Term: Attend rehab on a regular basis to increase amount of physical activity.;Long Term: Exercising regularly at least 3-5 days a week.;Long Term: Add in home exercise to make exercise part of routine and to increase amount of physical activity.       Increase Strength and Stamina Yes       Intervention Provide advice, education, support and counseling about physical activity/exercise needs.;Develop an individualized exercise prescription for aerobic and resistive training  based on initial evaluation findings, risk stratification, comorbidities and participant's personal goals.       Expected Outcomes Short Term: Increase workloads from initial exercise prescription for resistance, speed, and METs.;Short Term: Perform resistance training exercises routinely during rehab and add in resistance training at home;Long Term: Improve cardiorespiratory fitness, muscular endurance and strength as measured by increased METs and functional capacity ( )       Able to understand and use rate of perceived exertion (RPE) scale Yes       Intervention Provide education and explanation on how to use RPE scale       Expected Outcomes Short Term: Able to use RPE daily in rehab to express subjective intensity level;Long Term:  Able to use RPE to guide intensity level when exercising independently       Knowledge and understanding of Target Heart Rate Range (THRR) Yes       Intervention Provide education and explanation of THRR including how the numbers were predicted and where they are located for reference       Expected Outcomes Short Term: Able to state/look up THRR;Long Term: Able to use THRR to govern intensity when exercising independently;Short Term: Able to use daily as guideline for intensity in rehab       Understanding of Exercise Prescription Yes       Intervention Provide education, explanation, and written materials on patient's individual exercise prescription       Expected Outcomes Short Term: Able to explain program exercise prescription;Long Term: Able to explain home exercise prescription to exercise independently          Exercise Goals Re-Evaluation :  Exercise Goals Re-Evaluation     Row Name 04/22/24 0839 05/15/24 0805           Exercise Goal Re-Evaluation   Exercise Goals Review Increase Physical Activity;Understanding of Exercise Prescription;Increase Strength and Stamina;Knowledge and understanding of Target Heart Rate Range (THRR);Able to understand  and use rate of perceived exertion (RPE) scale Increase Physical Activity;Understanding of Exercise Prescription;Increase Strength and Stamina;Knowledge and understanding of Target Heart Rate Range (THRR);Able to understand and use rate of perceived exertion (RPE) scale  Comments Pt first day in the [facility] ICR program. Pt tolerated exercise well with an average MET level of 2.71. He is off to a good start and is learning his THRR, RPE and ExRx REVD MET's, goals and home ExRx. Pt tolerated exercise well with an average MET level of 3.12. He is doing well and progressing MET's. He has a rash today, onsite provider aware, he has an appointment later this month to be seen. He will continue to exercise by going to [employer] 2 days for 30-45 mins per session. He feels good about his goals and is increasing strength.      Expected Outcomes Will continue to monitor pt and progress workloads as tolerated without sign or symptom Will continue to monitor pt and progress workloads as tolerated without sign or symptom         Discharge Exercise Prescription (Final Exercise Prescription Changes):  Exercise Prescription Changes - 06/03/24 0831       Response to Exercise   Blood Pressure (Admit) 120/68    Blood Pressure (Exit) 112/66    Heart Rate (Admit) 66 bpm    Heart Rate (Exercise) 113 bpm    Heart Rate (Exit) 81 bpm    Rating of Perceived Exertion (Exercise) 9    Perceived Dyspnea (Exercise) 0    Symptoms none    Comments Reviewed MET's    Duration Progress to 30 minutes of  aerobic without signs/symptoms of physical distress    Intensity THRR unchanged      Progression   Progression Continue to progress workloads to maintain intensity without signs/symptoms of physical distress.    Average METs 3.61      Resistance Training   Training Prescription No    Weight 5    Reps 10-15    Time 10 Minutes      Treadmill   MPH 3   stayed on the TM 30 Mins   Grade 1    Minutes 15    METs 3.71       Recumbant Elliptical   Level 3    RPM 64    Watts 52    Minutes 15    METs 3.5      Home Exercise Plan   Plans to continue exercise at Home (comment)    Frequency Add 2 additional days to program exercise sessions.    Initial Home Exercises Provided 05/15/24          Nutrition:  Target Goals: Understanding of nutrition guidelines, daily intake of sodium 1500mg , cholesterol 200mg , calories 30% from fat and 7% or less from saturated fats, daily to have 5 or more servings of fruits and vegetables.  Biometrics:  Pre Biometrics - 04/18/24 1018       Pre Biometrics   Height 5' 6.75 (1.695 m)    Weight 76.7 kg    Waist Circumference 35 inches    Hip Circumference 38 inches    Waist to Hip Ratio 0.92 %    BMI (Calculated) 26.7    Triceps Skinfold 11 mm    % Body Fat 23.6 %    Grip Strength 15 kg    Flexibility 9 in    Single Leg Stand 24 seconds           Nutrition Therapy Plan and Nutrition Goals:   Nutrition Assessments:  Nutrition Assessments - 04/22/24 0924       MEDFICTS Scores   Pre Score 59         MEDIFICTS  Score Key: >=70 Need to make dietary changes  40-70 Heart Healthy Diet <= 40 Therapeutic Level Cholesterol Diet   Flowsheet Row INTENSIVE CARDIAC REHAB from 04/22/2024 in [facility] for Heart, Vascular, & Lung Health  Picture Your Plate Total Score on Admission 59   Picture Your Plate Scores: <59 Unhealthy dietary pattern with much room for improvement. 41-50 Dietary pattern unlikely to meet recommendations for good health and room for improvement. 51-60 More healthful dietary pattern, with some room for improvement.  >60 Healthy dietary pattern, although there may be some specific behaviors that could be improved.    Nutrition Goals Re-Evaluation:   Nutrition Goals Re-Evaluation:   Nutrition Goals Discharge (Final Nutrition Goals Re-Evaluation):   Psychosocial: Target Goals: Acknowledge presence or  absence of significant depression and/or stress, maximize coping skills, provide positive support system. Participant is able to verbalize types and ability to use techniques and skills needed for reducing stress and depression.  Initial Review & Psychosocial Screening:  Initial Psych Review & Screening - 04/18/24 1307       Initial Review   Current issues with None Identified      Family Dynamics   Good Support System? Yes   Yuki lives alone. Constant has his neice Hadassah and his brother Lynwood for support     Barriers   Psychosocial barriers to participate in program There are no identifiable barriers or psychosocial needs.      Screening Interventions   Interventions Encouraged to exercise          Quality of Life Scores:  Quality of Life - 04/18/24 0859       Quality of Life   Select Quality of Life      Quality of Life Scores   Health/Function Pre 22.86 %    Socioeconomic Pre 19.58 %    Psych/Spiritual Pre 22.5 %    Family Pre 20.83 %    GLOBAL Pre 21.92 %         Scores of 19 and below usually indicate a poorer quality of life in these areas.  A difference of  2-3 points is a clinically meaningful difference.  A difference of 2-3 points in the total score of the Quality of Life Index has been associated with significant improvement in overall quality of life, self-image, physical symptoms, and general health in studies assessing change in quality of life.  PHQ-9: Review Flowsheet       04/18/2024 04/16/2024 06/22/2021 11/04/2015  Depression screen PHQ 2/9  Decreased Interest 0 0 0 0  Down, Depressed, Hopeless 0 0 0 0  PHQ - 2 Score 0 0 0 0  Altered sleeping 0 0 0 0  Tired, decreased energy 0 0 0 0  Change in appetite 0 0 0 0  Feeling bad or failure about yourself  0 0 0 0  Trouble concentrating 0 0 0 3  Moving slowly or fidgety/restless 0 0 0 0  Suicidal thoughts 0 0 0 0   PHQ-9 Score 0 0 0 3  Difficult doing work/chores Not difficult at all Not difficult at all  - -    Details       Data saved with a previous flowsheet row definition        Interpretation of Total Score  Total Score Depression Severity:  1-4 = Minimal depression, 5-9 = Mild depression, 10-14 = Moderate depression, 15-19 = Moderately severe depression, 20-27 = Severe depression   Psychosocial Evaluation and Intervention:  Psychosocial Re-Evaluation:  Psychosocial Re-Evaluation     Row Name 04/30/24 1411 05/29/24 0742           Psychosocial Re-Evaluation   Current issues with None Identified None Identified      Interventions Encouraged to attend Cardiac Rehabilitation for the exercise Encouraged to attend Cardiac Rehabilitation for the exercise      Continue Psychosocial Services  No Follow up required No Follow up required         Psychosocial Discharge (Final Psychosocial Re-Evaluation):  Psychosocial Re-Evaluation - 05/29/24 0742       Psychosocial Re-Evaluation   Current issues with None Identified    Interventions Encouraged to attend Cardiac Rehabilitation for the exercise    Continue Psychosocial Services  No Follow up required          Vocational Rehabilitation: Provide vocational rehab assistance to qualifying candidates.   Vocational Rehab Evaluation & Intervention:  Vocational Rehab - 04/18/24 1308       Initial Vocational Rehab Evaluation & Intervention   Assessment shows need for Vocational Rehabilitation No   Pesach is retired and does not need vocational rehab at this time         Education: Education Goals: Education classes will be provided on a weekly basis, covering required topics. Participant will state understanding/return demonstration of topics presented.    Education     Row Name 04/22/24 0800     Education   Cardiac Education Topics [facility]   Select Core Videos     Core Videos   Educator Exercise Physiologist   Select Exercise Education   Exercise Education Improving Performance   Instruction Review Code 1-  Verbalizes Understanding   Class Start Time [phone]   Class Stop Time 0849   Class Time Calculation (min) 37 min    Row Name 05/08/24 0900     Education   Cardiac Education Topics [facility]   Orthoptist   Educator Nurse   Weekly Topic Powerhouse Plant-Based Proteins   Instruction Review Code 1- Verbalizes Understanding   Class Start Time 0815   Class Stop Time 0848   Class Time Calculation (min) 33 min    Row Name 05/13/24 0800     Education   Cardiac Education Topics [facility]   Geographical Information Systems Officer Psychosocial   Psychosocial Workshop From Head to Heart: The Power of a Healthy Outlook   Instruction Review Code 1- Verbalizes Understanding   Class Start Time [phone]   Class Stop Time 0857   Class Time Calculation (min) 43 min    Row Name 05/20/24 0700     Education   Cardiac Education Topics Pritikin   Select Core Videos     Core Videos   Educator Exercise Physiologist   Select Psychosocial   Psychosocial Healthy Minds, Bodies, Hearts   Instruction Review Code 1- Verbalizes Understanding   Class Start Time 0815   Class Stop Time 0847   Class Time Calculation (min) 32 min    Row Name 05/27/24 0900     Education   Cardiac Education Topics [facility]rit[facility]in   Select Workshops     Workshops   Educator Exercise Physiologist   Select Exercise   Exercise Workshop Location Manager and Fall Prevention   Instruction Review Code 1- Verbalizes Understanding   Class Start Time 0815   Class Stop Time 0850   Class Time Calculation (min) 35 min  Row Name 05/29/24 1000     Education   Cardiac Education Topics [facility]   Secondary School Teacher School   Educator Nurse;Respiratory Therapist   Weekly Topic Fast and Healthy Breakfasts   Instruction Review Code 1- Verbalizes Understanding   Class Start Time 0815   Class Stop Time [phone]   Class Time Calculation (min) 37 min      Core  Videos: Exercise    Move It!  Clinical staff conducted group or individual video education with verbal and written material and guidebook.  Patient learns the recommended [facility]in exercise program. Exercise with the goal of living a long, healthy life. Some of the health benefits of exercise include controlled diabetes, healthier blood pressure levels, improved cholesterol levels, improved heart and lung capacity, improved sleep, and better body composition. Everyone should speak with their doctor before starting or changing an exercise routine.  Biomechanical Limitations Clinical staff conducted group or individual video education with verbal and written material and guidebook.  Patient learns how biomechanical limitations can impact exercise and how we can mitigate and possibly overcome limitations to have an impactful and balanced exercise routine.  Body Composition Clinical staff conducted group or individual video education with verbal and written material and guidebook.  Patient learns that body composition (ratio of muscle mass to fat mass) is a key component to assessing overall fitness, rather than body weight alone. Increased fat mass, especially visceral belly fat, can put us  at increased risk for metabolic syndrome, type 2 diabetes, heart disease, and even death. It is recommended to combine diet and exercise (cardiovascular and resistance training) to improve your body composition. Seek guidance from your physician and exercise physiologist before implementing an exercise routine.  Exercise Action Plan Clinical staff conducted group or individual video education with verbal and written material and guidebook.  Patient learns the recommended strategies to achieve and enjoy long-term exercise adherence, including variety, self-motivation, self-efficacy, and positive decision making. Benefits of exercise include fitness, good health, weight management, more energy, better sleep, less  stress, and overall well-being.  Medical   Heart Disease Risk Reduction Clinical staff conducted group or individual video education with verbal and written material and guidebook.  Patient learns our heart is our most vital organ as it circulates oxygen, nutrients, white blood cells, and hormones throughout the entire body, and carries waste away. Data supports a plant-based eating plan like the Pritikin Program for its effectiveness in slowing progression of and reversing heart disease. The video provides a number of recommendations to address heart disease.   Metabolic Syndrome and Belly Fat  Clinical staff conducted group or individual video education with verbal and written material and guidebook.  Patient learns what metabolic syndrome is, how it leads to heart disease, and how one can reverse it and keep it from coming back. You have metabolic syndrome if you have 3 of the following 5 criteria: abdominal obesity, high blood pressure, high triglycerides, low HDL cholesterol, and high blood sugar.  Hypertension and Heart Disease Clinical staff conducted group or individual video education with verbal and written material and guidebook.  Patient learns that high blood pressure, or hypertension, is very common in the [place] . Hypertension is largely due to excessive salt intake, but other important risk factors include being overweight, physical inactivity, drinking too much alcohol, smoking, and not eating enough potassium from fruits and vegetables. High blood pressure is a leading risk factor for heart attack, stroke, congestive heart failure, dementia, kidney  failure, and premature death. Long-term effects of excessive salt intake include stiffening of the arteries and thickening of heart muscle and organ damage. Recommendations include ways to reduce hypertension and the risk of heart disease.  Diseases of Our Time - Focusing on Diabetes Clinical staff conducted group or individual  video education with verbal and written material and guidebook.  Patient learns why the best way to stop diseases of our time is prevention, through food and other lifestyle changes. Medicine (such as prescription pills and surgeries) is often only a Band-Aid on the problem, not a long-term solution. Most common diseases of our time include obesity, type 2 diabetes, hypertension, heart disease, and cancer. The Pritikin Program is recommended and has been proven to help reduce, reverse, and/or prevent the damaging effects of metabolic syndrome.  Nutrition   Overview of the Pritikin Eating Plan  Clinical staff conducted group or individual video education with verbal and written material and guidebook.  Patient learns about the Pritikin Eating Plan for disease risk reduction. The Pritikin Eating Plan emphasizes a wide variety of unrefined, minimally-processed carbohydrates, like fruits, vegetables, whole grains, and legumes. Go, Caution, and Stop food choices are explained. Plant-based and lean animal proteins are emphasized. Rationale provided for low sodium intake for blood pressure control, low added sugars for blood sugar stabilization, and low added fats and oils for coronary artery disease risk reduction and weight management.  Calorie Density  Clinical staff conducted group or individual video education with verbal and written material and guidebook.  Patient learns about calorie density and how it impacts the Pritikin Eating Plan. Knowing the characteristics of the food you choose will help you decide whether those foods will lead to weight gain or weight loss, and whether you want to consume more or less of them. Weight loss is usually a side effect of the Pritikin Eating Plan because of its focus on low calorie-dense foods.  Label Reading  Clinical staff conducted group or individual video education with verbal and written material and guidebook.  Patient learns about the Pritikin recommended  label reading guidelines and corresponding recommendations regarding calorie density, added sugars, sodium content, and whole grains.  Dining Out - Part 1  Clinical staff conducted group or individual video education with verbal and written material and guidebook.  Patient learns that restaurant meals can be sabotaging because they can be so high in calories, fat, sodium, and/or sugar. Patient learns recommended strategies on how to positively address this and avoid unhealthy pitfalls.  Facts on Fats  Clinical staff conducted group or individual video education with verbal and written material and guidebook.  Patient learns that lifestyle modifications can be just as effective, if not more so, as many medications for lowering your risk of heart disease. A Pritikin lifestyle can help to reduce your risk of inflammation and atherosclerosis (cholesterol build-up, or plaque, in the artery walls). Lifestyle interventions such as dietary choices and physical activity address the cause of atherosclerosis. A review of the types of fats and their impact on blood cholesterol levels, along with dietary recommendations to reduce fat intake is also included.  Nutrition Action Plan  Clinical staff conducted group or individual video education with verbal and written material and guidebook.  Patient learns how to incorporate Pritikin recommendations into their lifestyle. Recommendations include planning and keeping personal health goals in mind as an important part of their success.  Healthy Mind-Set    Healthy Minds, Bodies, Hearts  Clinical staff conducted group or individual video education  with verbal and written material and guidebook.  Patient learns how to identify when they are stressed. Video will discuss the impact of that stress, as well as the many benefits of stress management. Patient will also be introduced to stress management techniques. The way we think, act, and feel has an impact on our  hearts.  How Our Thoughts Can Heal Our Hearts  Clinical staff conducted group or individual video education with verbal and written material and guidebook.  Patient learns that negative thoughts can cause depression and anxiety. This can result in negative lifestyle behavior and serious health problems. Cognitive behavioral therapy is an effective method to help control our thoughts in order to change and improve our emotional outlook.  Additional Videos:  Exercise    Improving Performance  Clinical staff conducted group or individual video education with verbal and written material and guidebook.  Patient learns to use a non-linear approach by alternating intensity levels and lengths of time spent exercising to help burn more calories and lose more body fat. Cardiovascular exercise helps improve heart health, metabolism, hormonal balance, blood sugar control, and recovery from fatigue. Resistance training improves strength, endurance, balance, coordination, reaction time, metabolism, and muscle mass. Flexibility exercise improves circulation, posture, and balance. Seek guidance from your physician and exercise physiologist before implementing an exercise routine and learn your capabilities and proper form for all exercise.  Introduction to Yoga  Clinical staff conducted group or individual video education with verbal and written material and guidebook.  Patient learns about yoga, a discipline of the coming together of mind, breath, and body. The benefits of yoga include improved flexibility, improved range of motion, better posture and core strength, increased lung function, weight loss, and positive self-image. Yoga's heart health benefits include lowered blood pressure, healthier heart rate, decreased cholesterol and triglyceride levels, improved immune function, and reduced stress. Seek guidance from your physician and exercise physiologist before implementing an exercise routine and learn your  capabilities and proper form for all exercise.  Medical   Aging: Enhancing Your Quality of Life  Clinical staff conducted group or individual video education with verbal and written material and guidebook.  Patient learns key strategies and recommendations to stay in good physical health and enhance quality of life, such as prevention strategies, having an advocate, securing a Health Care Proxy and Power of Attorney, and keeping a list of medications and system for tracking them. It also discusses how to avoid risk for bone loss.  Biology of Weight Control  Clinical staff conducted group or individual video education with verbal and written material and guidebook.  Patient learns that weight gain occurs because we consume more calories than we burn (eating more, moving less). Even if your body weight is normal, you may have higher ratios of fat compared to muscle mass. Too much body fat puts you at increased risk for cardiovascular disease, heart attack, stroke, type 2 diabetes, and obesity-related cancers. In addition to exercise, following the Pritikin Eating Plan can help reduce your risk.  Decoding Lab Results  Clinical staff conducted group or individual video education with verbal and written material and guidebook.  Patient learns that lab test reflects one measurement whose values change over time and are influenced by many factors, including medication, stress, sleep, exercise, food, hydration, pre-existing medical conditions, and more. It is recommended to use the knowledge from this video to become more involved with your lab results and evaluate your numbers to speak with your doctor.   Diseases of  Our Time - Overview  Clinical staff conducted group or individual video education with verbal and written material and guidebook.  Patient learns that according to the CDC, 50% to 70% of chronic diseases (such as obesity, type 2 diabetes, elevated lipids, hypertension, and heart disease) are  avoidable through lifestyle improvements including healthier food choices, listening to satiety cues, and increased physical activity.  Sleep Disorders Clinical staff conducted group or individual video education with verbal and written material and guidebook.  Patient learns how good quality and duration of sleep are important to overall health and well-being. Patient also learns about sleep disorders and how they impact health along with recommendations to address them, including discussing with a physician.  Nutrition  Dining Out - Part 2 Clinical staff conducted group or individual video education with verbal and written material and guidebook.  Patient learns how to plan ahead and communicate in order to maximize their dining experience in a healthy and nutritious manner. Included are recommended food choices based on the type of restaurant the patient is visiting.   Fueling a Banker conducted group or individual video education with verbal and written material and guidebook.  There is a strong connection between our food choices and our health. Diseases like obesity and type 2 diabetes are very prevalent and are in large-part due to lifestyle choices. The Pritikin Eating Plan provides plenty of food and hunger-curbing satisfaction. It is easy to follow, affordable, and helps reduce health risks.  Menu Workshop  Clinical staff conducted group or individual video education with verbal and written material and guidebook.  Patient learns that restaurant meals can sabotage health goals because they are often packed with calories, fat, sodium, and sugar. Recommendations include strategies to plan ahead and to communicate with the manager, chef, or server to help order a healthier meal.  Planning Your Eating Strategy  Clinical staff conducted group or individual video education with verbal and written material and guidebook.  Patient learns about the Pritikin Eating Plan and  its benefit of reducing the risk of disease. The Pritikin Eating Plan does not focus on calories. Instead, it emphasizes high-quality, nutrient-rich foods. By knowing the characteristics of the foods, we choose, we can determine their calorie density and make informed decisions.  Targeting Your Nutrition Priorities  Clinical staff conducted group or individual video education with verbal and written material and guidebook.  Patient learns that lifestyle habits have a tremendous impact on disease risk and progression. This video provides eating and physical activity recommendations based on your personal health goals, such as reducing LDL cholesterol, losing weight, preventing or controlling type 2 diabetes, and reducing high blood pressure.  Vitamins and Minerals  Clinical staff conducted group or individual video education with verbal and written material and guidebook.  Patient learns different ways to obtain key vitamins and minerals, including through a recommended healthy diet. It is important to discuss all supplements you take with your doctor.   Healthy Mind-Set    Smoking Cessation  Clinical staff conducted group or individual video education with verbal and written material and guidebook.  Patient learns that cigarette smoking and tobacco addiction pose a serious health risk which affects millions of people. Stopping smoking will significantly reduce the risk of heart disease, lung disease, and many forms of cancer. Recommended strategies for quitting are covered, including working with your doctor to develop a successful plan.  Culinary   Becoming a Set Designer conducted group or individual video  education with verbal and written material and guidebook.  Patient learns that cooking at home can be healthy, cost-effective, quick, and puts them in control. Keys to cooking healthy recipes will include looking at your recipe, assessing your equipment needs, planning ahead,  making it simple, choosing cost-effective seasonal ingredients, and limiting the use of added fats, salts, and sugars.  Cooking - Breakfast and Snacks  Clinical staff conducted group or individual video education with verbal and written material and guidebook.  Patient learns how important breakfast is to satiety and nutrition through the entire day. Recommendations include key foods to eat during breakfast to help stabilize blood sugar levels and to prevent overeating at meals later in the day. Planning ahead is also a key component.  Cooking - Educational Psychologist conducted group or individual video education with verbal and written material and guidebook.  Patient learns eating strategies to improve overall health, including an approach to cook more at home. Recommendations include thinking of animal protein as a side on your plate rather than center stage and focusing instead on lower calorie dense options like vegetables, fruits, whole grains, and plant-based proteins, such as beans. Making sauces in large quantities to freeze for later and leaving the skin on your vegetables are also recommended to maximize your experience.  Cooking - Healthy Salads and Dressing Clinical staff conducted group or individual video education with verbal and written material and guidebook.  Patient learns that vegetables, fruits, whole grains, and legumes are the foundations of the Pritikin Eating Plan. Recommendations include how to incorporate each of these in flavorful and healthy salads, and how to create homemade salad dressings. Proper handling of ingredients is also covered. Cooking - Soups and [employer] - Soups and Desserts Clinical staff conducted group or individual video education with verbal and written material and guidebook.  Patient learns that Pritikin soups and desserts make for easy, nutritious, and delicious snacks and meal components that are low in sodium, fat, sugar, and  calorie density, while high in vitamins, minerals, and filling fiber. Recommendations include simple and healthy ideas for soups and desserts.   Overview     The Pritikin Solution Program Overview Clinical staff conducted group or individual video education with verbal and written material and guidebook.  Patient learns that the results of the Pritikin Program have been documented in more than 100 articles published in peer-reviewed journals, and the benefits include reducing risk factors for (and, in some cases, even reversing) high cholesterol, high blood pressure, type 2 diabetes, obesity, and more! An overview of the three key pillars of the Pritikin Program will be covered: eating well, doing regular exercise, and having a healthy mind-set.  WORKSHOPS  Exercise: Exercise Basics: Building Your Action Plan Clinical staff led group instruction and group discussion with PowerPoint presentation and patient guidebook. To enhance the learning environment the use of posters, models and videos may be added. At the conclusion of this workshop, patients will comprehend the difference between physical activity and exercise, as well as the benefits of incorporating both, into their routine. Patients will understand the FITT (Frequency, Intensity, Time, and Type) principle and how to use it to build an exercise action plan. In addition, safety concerns and other considerations for exercise and cardiac rehab will be addressed by the presenter. The purpose of this lesson is to promote a comprehensive and effective weekly exercise routine in order to improve patients' overall level of fitness.   Managing Heart Disease: Your Path  to a Healthier Heart Clinical staff led group instruction and group discussion with PowerPoint presentation and patient guidebook. To enhance the learning environment the use of posters, models and videos may be added.At the conclusion of this workshop, patients will understand the  anatomy and physiology of the heart. Additionally, they will understand how Pritikin's three pillars impact the risk factors, the progression, and the management of heart disease.  The purpose of this lesson is to provide a high-level overview of the heart, heart disease, and how the Pritikin lifestyle positively impacts risk factors.  Exercise Biomechanics Clinical staff led group instruction and group discussion with PowerPoint presentation and patient guidebook. To enhance the learning environment the use of posters, models and videos may be added. Patients will learn how the structural parts of their bodies function and how these functions impact their daily activities, movement, and exercise. Patients will learn how to promote a neutral spine, learn how to manage pain, and identify ways to improve their physical movement in order to promote healthy living. The purpose of this lesson is to expose patients to common physical limitations that impact physical activity. Participants will learn practical ways to adapt and manage aches and pains, and to minimize their effect on regular exercise. Patients will learn how to maintain good posture while sitting, walking, and lifting.  Balance Training and Fall Prevention  Clinical staff led group instruction and group discussion with PowerPoint presentation and patient guidebook. To enhance the learning environment the use of posters, models and videos may be added. At the conclusion of this workshop, patients will understand the importance of their sensorimotor skills (vision, proprioception, and the vestibular system) in maintaining their ability to balance as they age. Patients will apply a variety of balancing exercises that are appropriate for their current level of function. Patients will understand the common causes for poor balance, possible solutions to these problems, and ways to modify their physical environment in order to minimize their  fall risk. The purpose of this lesson is to teach patients about the importance of maintaining balance as they age and ways to minimize their risk of falling.  WORKSHOPS   Nutrition:  Fueling a Ship Broker led group instruction and group discussion with PowerPoint presentation and patient guidebook. To enhance the learning environment the use of posters, models and videos may be added. Patients will review the foundational principles of the Pritikin Eating Plan and understand what constitutes a serving size in each of the food groups. Patients will also learn Pritikin-friendly foods that are better choices when away from home and review make-ahead meal and snack options. Calorie density will be reviewed and applied to three nutrition priorities: weight maintenance, weight loss, and weight gain. The purpose of this lesson is to reinforce (in a group setting) the key concepts around what patients are recommended to eat and how to apply these guidelines when away from home by planning and selecting Pritikin-friendly options. Patients will understand how calorie density may be adjusted for different weight management goals.  Mindful Eating  Clinical staff led group instruction and group discussion with PowerPoint presentation and patient guidebook. To enhance the learning environment the use of posters, models and videos may be added. Patients will briefly review the concepts of the Pritikin Eating Plan and the importance of low-calorie dense foods. The concept of mindful eating will be introduced as well as the importance of paying attention to internal hunger signals. Triggers for non-hunger eating and techniques for dealing with triggers  will be explored. The purpose of this lesson is to provide patients with the opportunity to review the basic principles of the Pritikin Eating Plan, discuss the value of eating mindfully and how to measure internal cues of hunger and fullness using the  Hunger Scale. Patients will also discuss reasons for non-hunger eating and learn strategies to use for controlling emotional eating.  Targeting Your Nutrition Priorities Clinical staff led group instruction and group discussion with PowerPoint presentation and patient guidebook. To enhance the learning environment the use of posters, models and videos may be added. Patients will learn how to determine their genetic susceptibility to disease by reviewing their family history. Patients will gain insight into the importance of diet as part of an overall healthy lifestyle in mitigating the impact of genetics and other environmental insults. The purpose of this lesson is to provide patients with the opportunity to assess their personal nutrition priorities by looking at their family history, their own health history and current risk factors. Patients will also be able to discuss ways of prioritizing and modifying the Pritikin Eating Plan for their highest risk areas  Menu  Clinical staff led group instruction and group discussion with PowerPoint presentation and patient guidebook. To enhance the learning environment the use of posters, models and videos may be added. Using menus brought in from [employer], or printed from [employer], patients will apply the Pritikin dining out guidelines that were presented in the [employer] video. Patients will also be able to practice these guidelines in a variety of provided scenarios. The purpose of this lesson is to provide patients with the opportunity to practice hands-on learning of the Pritikin Dining Out guidelines with actual menus and practice scenarios.  Label Reading Clinical staff led group instruction and group discussion with PowerPoint presentation and patient guidebook. To enhance the learning environment the use of posters, models and videos may be added. Patients will review and discuss the Pritikin label reading guidelines  presented in Pritikin's Label Reading Educational series video. Using fool labels brought in from local grocery stores and markets, patients will apply the label reading guidelines and determine if the packaged food meet the Pritikin guidelines. The purpose of this lesson is to provide patients with the opportunity to review, discuss, and practice hands-on learning of the Pritikin Label Reading guidelines with actual packaged food labels. Cooking School  Pritikin's [employer] are designed to teach patients ways to prepare quick, simple, and affordable recipes at home. The importance of nutrition's role in chronic disease risk reduction is reflected in its emphasis in the overall Pritikin program. By learning how to prepare essential core Pritikin Eating Plan recipes, patients will increase control over what they eat; be able to customize the flavor of foods without the use of added salt, sugar, or fat; and improve the quality of the food they consume. By learning a set of core recipes which are easily assembled, quickly prepared, and affordable, patients are more likely to prepare more healthy foods at home. These workshops focus on convenient breakfasts, simple entres, side dishes, and desserts which can be prepared with minimal effort and are consistent with nutrition recommendations for cardiovascular risk reduction. Cooking [employer] are taught by a armed forces logistics/support/administrative officer (RD) who has been trained by the [employer]. The chef or RD has a clear understanding of the importance of minimizing - if not completely eliminating - added fat, sugar, and sodium in recipes. Throughout the series of  Cooking School Workshop sessions, patients will learn about healthy ingredients and efficient methods of cooking to build confidence in their capability to prepare    [employer] weekly topics:  Adding Flavor- Sodium-Free  Fast and Healthy Breakfasts  Powerhouse Plant-Based  Proteins  Satisfying Salads and Dressings  Simple Sides and Sauces  International Cuisine-Spotlight on the [employer] Zones  Delicious Desserts  Savory Soups  Efficiency Cooking - Meals in a Astronomer Appetizers and Snacks  Comforting Weekend Breakfasts  One-Pot Wonders   Fast Evening Meals  Landscape Architect Your Pritikin Plate  WORKSHOPS   Healthy Mindset (Psychosocial):  Focused Goals, Sustainable Changes Clinical staff led group instruction and group discussion with PowerPoint presentation and patient guidebook. To enhance the learning environment the use of posters, models and videos may be added. Patients will be able to apply effective goal setting strategies to establish at least one personal goal, and then take consistent, meaningful action toward that goal. They will learn to identify common barriers to achieving personal goals and develop strategies to overcome them. Patients will also gain an understanding of how our mind-set can impact our ability to achieve goals and the importance of cultivating a positive and growth-oriented mind-set. The purpose of this lesson is to provide patients with a deeper understanding of how to set and achieve personal goals, as well as the tools and strategies needed to overcome common obstacles which may arise along the way.  From Head to Heart: The Power of a Healthy Outlook  Clinical staff led group instruction and group discussion with PowerPoint presentation and patient guidebook. To enhance the learning environment the use of posters, models and videos may be added. Patients will be able to recognize and describe the impact of emotions and mood on physical health. They will discover the importance of self-care and explore self-care practices which may work for them. Patients will also learn how to utilize the 4 C's to cultivate a healthier outlook and better manage stress and challenges. The purpose of this lesson is to demonstrate  to patients how a healthy outlook is an essential part of maintaining good health, especially as they continue their cardiac rehab journey.  Healthy Sleep for a Healthy Heart Clinical staff led group instruction and group discussion with PowerPoint presentation and patient guidebook. To enhance the learning environment the use of posters, models and videos may be added. At the conclusion of this workshop, patients will be able to demonstrate knowledge of the importance of sleep to overall health, well-being, and quality of life. They will understand the symptoms of, and treatments for, common sleep disorders. Patients will also be able to identify daytime and nighttime behaviors which impact sleep, and they will be able to apply these tools to help manage sleep-related challenges. The purpose of this lesson is to provide patients with a general overview of sleep and outline the importance of quality sleep. Patients will learn about a few of the most common sleep disorders. Patients will also be introduced to the concept of "sleep hygiene," and discover ways to self-manage certain sleeping problems through simple daily behavior changes. Finally, the workshop will motivate patients by clarifying the links between quality sleep and their goals of heart-healthy living.   Recognizing and Reducing Stress Clinical staff led group instruction and group discussion with PowerPoint presentation and patient guidebook. To enhance the learning environment the use of posters, models and videos may be added. At the conclusion of this workshop, patients will be able  to understand the types of stress reactions, differentiate between acute and chronic stress, and recognize the impact that chronic stress has on their health. They will also be able to apply different coping mechanisms, such as reframing negative self-talk. Patients will have the opportunity to practice a variety of stress management techniques, such as deep  abdominal breathing, progressive muscle relaxation, and/or guided imagery.  The purpose of this lesson is to educate patients on the role of stress in their lives and to provide healthy techniques for coping with it.  Learning Barriers/Preferences:  Learning Barriers/Preferences - 04/18/24 1341       Learning Barriers/Preferences   Learning Barriers Language   He is unable to read and needs individual attention   Learning Preferences Audio;Group Instruction;Individual Instruction;Pictoral;Verbal Instruction          Education Topics:  Knowledge Questionnaire Score:  Knowledge Questionnaire Score - 04/18/24 0900       Knowledge Questionnaire Score   Pre Score 21/24          Core Components/Risk Factors/Patient Goals at Admission:  Personal Goals and Risk Factors at Admission - 04/18/24 1019       Core Components/Risk Factors/Patient Goals on Admission    Weight Management Yes;Weight Maintenance    Expected Outcomes Weight Maintenance: Understanding of the daily nutrition guidelines, which includes 25-35% calories from fat, 7% or less cal from saturated fats, less than 200mg  cholesterol, less than 1.5gm of sodium, & 5 or more servings of fruits and vegetables daily;Understanding recommendations for meals to include 15-35% energy as protein, 25-35% energy from fat, 35-60% energy from carbohydrates, less than 200mg  of dietary cholesterol, 20-35 gm of total fiber daily;Understanding of distribution of calorie intake throughout the day with the consumption of 4-5 meals/snacks    Heart Failure Yes    Intervention Provide a combined exercise and nutrition program that is supplemented with education, support and counseling about heart failure. Directed toward relieving symptoms such as shortness of breath, decreased exercise tolerance, and extremity edema.    Expected Outcomes Improve functional capacity of life;Short term: Attendance in program 2-3 days a week with increased exercise  capacity. Reported lower sodium intake. Reported increased fruit and vegetable intake. Reports medication compliance.;Short term: Daily weights obtained and reported for increase. Utilizing diuretic protocols set by physician.;Long term: Adoption of self-care skills and reduction of barriers for early signs and symptoms recognition and intervention leading to self-care maintenance.    Hypertension Yes    Intervention Provide education on lifestyle modifcations including regular physical activity/exercise, weight management, moderate sodium restriction and increased consumption of fresh fruit, vegetables, and low fat dairy, alcohol moderation, and smoking cessation.;Monitor prescription use compliance.    Expected Outcomes Short Term: Continued assessment and intervention until BP is < 140/53mm HG in hypertensive participants. < 130/52mm HG in hypertensive participants with diabetes, heart failure or chronic kidney disease.;Long Term: Maintenance of blood pressure at goal levels.    Lipids Yes    Intervention Provide education and support for participant on nutrition & aerobic/resistive exercise along with prescribed medications to achieve LDL 70mg , HDL >40mg .    Expected Outcomes Short Term: Participant states understanding of desired cholesterol values and is compliant with medications prescribed. Participant is following exercise prescription and nutrition guidelines.;Long Term: Cholesterol controlled with medications as prescribed, with individualized exercise RX and with personalized nutrition plan. Value goals: LDL < 70mg , HDL > 40 mg.    Personal Goal Other Yes    Personal Goal He wants to feel back to  normal, know limits to exercise, get back to the gym, strength    Intervention Will continue to monitor pt and progressworkloads as tolerated without sign or symptom    Expected Outcomes Will continue to monitor pt and progress workloads as tolerated          Core Components/Risk Factors/Patient  Goals Review:   Goals and Risk Factor Review     Row Name 04/30/24 1412 05/29/24 0743           Core Components/Risk Factors/Patient Goals Review   Personal Goals Review Weight Management/Obesity;Heart Failure;Hypertension;Lipids Weight Management/Obesity;Heart Failure;Hypertension;Lipids      Review Willian is doing well with exercise at cardiac rehab since his 04/22/24 start. Vital signs and CBG's have been stable, MET levels consistent with orientation MET levels. Joseangel is doing well with exercise at cardiac rehab since his 04/22/24 start. Vital signs and CBG's have been stable, MET levels have increased.      Expected Outcomes Gregroy will continue to particpate in cardiac rehab for exercise nutriton and lifestyle modifications. Mihir will continue to particpate in cardiac rehab for exercise nutriton and lifestyle modifications.         Core Components/Risk Factors/Patient Goals at Discharge (Final Review):   Goals and Risk Factor Review - 05/29/24 0743       Core Components/Risk Factors/Patient Goals Review   Personal Goals Review Weight Management/Obesity;Heart Failure;Hypertension;Lipids    Review Samaad is doing well with exercise at cardiac rehab since his 04/22/24 start. Vital signs and CBG's have been stable, MET levels have increased.    Expected Outcomes Emonte will continue to particpate in cardiac rehab for exercise nutriton and lifestyle modifications.          ITP Comments:  ITP Comments     Row Name 04/18/24 [phone] 04/22/24 0835 04/30/24 1411 05/29/24 0742     ITP Comments Dr. Wilbert Bihari medical director. Introduction to pritikin education/intensive cardiac rehab. Initial orientation packet reviewed with patient. 30 Day ITP Review. Breon started cardiac rehab on 04/22/24 and did well with exercise 30 Day ITP Review. Zadkiel is doing well with exercise at cardiac rehab thus far 30 Day ITP Review. Taylin has good participation with exercise at cardiac rehab when in attendance.        Comments: see ITP comments

## 2024-06-05 ENCOUNTER — Encounter (HOSPITAL_COMMUNITY)
Admission: RE | Admit: 2024-06-05 | Discharge: 2024-06-05 | Disposition: A | Discharge status: Home / Self Care | Source: Ambulatory Visit | Attending: Internal Medicine | Admitting: Internal Medicine

## 2024-06-05 DIAGNOSIS — Z955 Presence of coronary angioplasty implant and graft: Secondary | ICD-10-CM

## 2024-06-05 DIAGNOSIS — I213 ST elevation (STEMI) myocardial infarction of unspecified site: Secondary | ICD-10-CM

## 2024-06-06 ENCOUNTER — Other Ambulatory Visit: Payer: Self-pay

## 2024-06-07 ENCOUNTER — Telehealth (HOSPITAL_COMMUNITY): Payer: Self-pay | Admitting: Internal Medicine

## 2024-06-07 ENCOUNTER — Encounter (HOSPITAL_COMMUNITY)

## 2024-06-07 NOTE — Telephone Encounter (Signed)
 Called to confirm/remind patient of their appointment at the [facility] on 06/07/2024.   Appointment:   [x] Confirmed  [] Left mess   [] No answer/No voice mail  [] VM Full/unable to leave message  [] Phone not in service  Patient reminded to bring all medications and/or complete list.  Confirmed patient has transportation. Gave directions, instructed to utilize valet parking.

## 2024-06-09 NOTE — Progress Notes (Signed)
 [facility] Note  PCP: Delbert Clam, MD HF Cardiologist: Dr. Cherrie  HPI: 68 y.o. AA male with prostate cancer s/p prostate cryoablation 08/25, HTN, CAD and ischemic cardiomyopathy with OOH cardiac arrest in 8/25.  Admitted 03/15/24 with witnessed, out of hospital, cardiac arrest while shopping at [employer]. Received 15 minutes of CPR and had shockable rhythm (VF). Post arrest had inferolateral STE on ECG. 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 Also had 80% small PDA treated medically. Echo with LVEF 40%, HK inferior/inferolateral walls, RV okay.  Today he returns for HF follow up. Doing well. No CP or SOB. Going to CR without problem. Compliant with meds. No palpitations. Going to [employer] regularly.    Echo today 06/10/24  EF ~50% frequent PACs RV ok. Personally reviewed   Cardiac Studies  - Echo 8/25: EF 40%, mild LVH, normal RV  - LHC 8/25: PTCA to RPLA   Past Medical History:  Diagnosis Date   BPH (benign prostatic hyperplasia)    Coronary artery disease    Hx of radiation therapy 09/06/10 - 10/29/10   prostate, seminal vesicles   Hypertension    on meds   Prostate cancer (HCC) 04/26/2010   gleason 7, vol 20.88 cc   Current Outpatient Medications  Medication Sig Dispense Refill   aspirin  81 MG chewable tablet Chew 1 tablet (81 mg total) by mouth daily. 90 tablet 2   atorvastatin  (LIPITOR ) 80 MG tablet Take 1 tablet (80 mg total) by mouth daily. 90 tablet 2   clopidogrel  (PLAVIX ) 75 MG tablet Take 1 tablet (75 mg total) by mouth daily. 90 tablet 2   dapagliflozin  propanediol (FARXIGA ) 10 MG TABS tablet Take 1 tablet (10 mg total) by mouth daily before breakfast. 30 tablet 6   ENTRESTO  97-103 MG Take 1 tablet by mouth 2 (two) times daily. 90 tablet 2   nitroGLYCERIN  (NITROSTAT ) 0.4 MG SL tablet Place 1 tablet (0.4 mg total) under the tongue every 5 (five) minutes as needed for chest  pain. 25 tablet 3   spironolactone  (ALDACTONE ) 25 MG tablet Take 1 tablet (25 mg total) by mouth daily. 90 tablet 2   tamsulosin  (FLOMAX ) 0.4 MG CAPS capsule Take 1 capsule (0.4 mg total) by mouth 2 (two) times daily. 60 capsule 11   Brimonidine  Tartrate (LUMIFY  OP) Place 1 drop into both eyes daily. (Patient not taking: Reported on 06/10/2024)     metoprolol  succinate (TOPROL -XL) 25 MG 24 hr tablet Take 1 tablet (25 mg total) by mouth daily. 90 tablet 2   potassium chloride  SA (KLOR-CON  M) 20 MEQ tablet Take 1 tablet (20 mEq total) by mouth daily for 10 days. (Patient not taking: Reported on 06/10/2024) 10 tablet 0   senna-docusate (SENOKOT-S) 8.6-50 MG tablet Take 1 tablet by mouth 2 (two) times daily. While taking pain meds to prevent constipation (Patient not taking: Reported on 06/10/2024) 10 tablet 0   triamcinolone cream (KENALOG) 0.1 % Apply 1 Application topically 2 (two) times daily. (Patient not taking: Reported on 06/10/2024) 30 g 0   XTANDI 80 MG tablet Take 80 mg by mouth daily. (Patient not taking: Reported on 06/10/2024)     No current facility-administered medications for this encounter.   No Known Allergies  Social History   Socioeconomic History   Marital status: Single    Spouse name: Not on file   Number of children: Not on file   Years of education: Not on  file   Highest education level: Patient refused  Occupational History   Occupation: Retired  Tobacco Use   Smoking status: Never   Smokeless tobacco: Never  Vaping Use   Vaping status: Never Used  Substance and Sexual Activity   Alcohol use: Never    Alcohol/week: 7.0 standard drinks of alcohol    Types: 7 Standard drinks or equivalent per week   Drug use: No   Sexual activity: Not Currently  Other Topics Concern   Not on file  Social History Narrative   Not on file   Social Drivers of Health   Financial Resource Strain: Not on file  Food Insecurity: No Food Insecurity (03/17/2024)   Hunger Vital  Sign    Worried About Running Out of Food in the Last Year: Never true    Ran Out of Food in the Last Year: Never true  Transportation Needs: No Transportation Needs (03/17/2024)   PRAPARE - Administrator, Civil Service (Medical): No    Lack of Transportation (Non-Medical): No  Physical Activity: Not on file  Stress: Not on file  Social Connections: Unknown (03/17/2024)   Social Connection and Isolation Panel    Frequency of Communication with Friends and Family: Patient declined    Frequency of Social Gatherings with Friends and Family: Patient declined    Attends Religious Services: Patient declined    Database Administrator or Organizations: Not on file    Attends Banker Meetings: Not on file    Marital Status: Patient declined  Intimate Partner Violence: Not At Risk (03/17/2024)   Humiliation, Afraid, Rape, and Kick questionnaire    Fear of Current or Ex-Partner: No    Emotionally Abused: No    Physically Abused: No    Sexually Abused: No    Family History  Problem Relation Age of Onset   Stroke Mother    Colon cancer Neg Hx    Colon polyps Neg Hx    Esophageal cancer Neg Hx    Stomach cancer Neg Hx    Rectal cancer Neg Hx    Wt Readings from Last 3 Encounters:  06/10/24 73.5 kg (162 lb)  05/30/24 73.5 kg (162 lb)  04/18/24 76.7 kg (169 lb 1.5 oz)   BP 120/64   Pulse (!) 54   Ht 5' 6 (1.676 m)   Wt 73.5 kg (162 lb)   SpO2 98%   BMI 26.15 kg/m   PHYSICAL EXAM: General:  Sitting up No resp difficulty HEENT: normal Neck: supple. no JVD.  Cor: Irregular rate & rhythm. No rubs, gallops or murmurs. Lungs: clear Abdomen: soft, nontender, nondistended.Good bowel sounds. Extremities: no cyanosis, clubbing, rash, edema Neuro: alert & orientedx3, cranial nerves grossly intact. moves all 4 extremities w/o difficulty. Affect pleasant   ASSESSMENT & PLAN:  1. OOH VF Cardiac Arrest  - Witnessed VF arrest 08/25, CPR initiated ~1 min after found  down, total ~15 min CPR, 6 defibs, epi and amio  - post arrest EKG w/ anterolateral STE. Emergent cath w/ right PLA occlusion (culprit). S/p PCI  - Echo 8/25  EF 40% - Echo today 06/10/24  EF ~50% frequent PACs RV ok. Personally reviewed   2. CAD  - Inferolateral STEMI 08/25. Culprit on cath with occlusion of large RPLA, s/p PCI/DES. Had 80% small RPDA treated medically - Continue DAPT with ASA + Plavix  x 1 year (Brilinta  changed to Plavix  due to interaction with prostate meds) - Continue high-intensity statin - Continue CR -  No s/s angina   3. HFrEF, ICM - Echo 8/25 EF 40%, RWMA, RV okay - Cath in 08/25 as above - Echo today 06/10/24  EF ~50% frequent PACs RV ok. Personally reviewed - NYHA I volume ok  - Continue Farxiga  10 mg daily.  - Continue Entresto  97/103 mg bid - Continue Toprol  XL 25 mg daily  - Continue spironolactone  25 mg daily  - BMET/BNP today  4. HTN - Blood pressure well controlled. Continue current regimen.   5. Prostate Cancer  - s/p prostate cryoablation on 03/06/24  6. Frequent PACs - denies snoring - Quantify with 7-day Zio - Check labs  Refer to [facility] cardiology to establish long-term care   Toribio Fuel, MD 06/10/24

## 2024-06-10 ENCOUNTER — Ambulatory Visit (HOSPITAL_COMMUNITY)
Admission: RE | Admit: 2024-06-10 | Discharge: 2024-06-10 | Disposition: A | Discharge status: Home / Self Care | Source: Ambulatory Visit | Attending: Internal Medicine | Admitting: Internal Medicine

## 2024-06-10 ENCOUNTER — Encounter (HOSPITAL_COMMUNITY): Payer: Self-pay | Admitting: Internal Medicine

## 2024-06-10 ENCOUNTER — Other Ambulatory Visit (HOSPITAL_COMMUNITY): Payer: Self-pay | Admitting: Internal Medicine

## 2024-06-10 ENCOUNTER — Ambulatory Visit (HOSPITAL_BASED_OUTPATIENT_CLINIC_OR_DEPARTMENT_OTHER)
Admission: RE | Admit: 2024-06-10 | Discharge: 2024-06-10 | Disposition: A | Discharge status: Home / Self Care | Source: Ambulatory Visit | Attending: Family Medicine | Admitting: Family Medicine

## 2024-06-10 ENCOUNTER — Ambulatory Visit (HOSPITAL_BASED_OUTPATIENT_CLINIC_OR_DEPARTMENT_OTHER)
Admission: RE | Admit: 2024-06-10 | Discharge: 2024-06-10 | Disposition: A | Discharge status: Home / Self Care | Source: Ambulatory Visit | Attending: Internal Medicine | Admitting: Internal Medicine

## 2024-06-10 ENCOUNTER — Encounter (HOSPITAL_COMMUNITY)
Admission: RE | Admit: 2024-06-10 | Discharge: 2024-06-10 | Disposition: A | Discharge status: Home / Self Care | Source: Ambulatory Visit | Attending: Internal Medicine | Admitting: Internal Medicine

## 2024-06-10 ENCOUNTER — Encounter (HOSPITAL_COMMUNITY)

## 2024-06-10 VITALS — BP 120/64 | HR 54 | Ht 66.0 in | Wt 162.0 lb

## 2024-06-10 DIAGNOSIS — I491 Atrial premature depolarization: Secondary | ICD-10-CM

## 2024-06-10 DIAGNOSIS — Z7982 Long term (current) use of aspirin: Secondary | ICD-10-CM | POA: Insufficient documentation

## 2024-06-10 DIAGNOSIS — I255 Ischemic cardiomyopathy: Secondary | ICD-10-CM

## 2024-06-10 DIAGNOSIS — I4901 Ventricular fibrillation: Secondary | ICD-10-CM

## 2024-06-10 DIAGNOSIS — I110 Hypertensive heart disease with heart failure: Secondary | ICD-10-CM | POA: Insufficient documentation

## 2024-06-10 DIAGNOSIS — Z955 Presence of coronary angioplasty implant and graft: Secondary | ICD-10-CM | POA: Diagnosis not present

## 2024-06-10 DIAGNOSIS — I5022 Chronic systolic (congestive) heart failure: Secondary | ICD-10-CM

## 2024-06-10 DIAGNOSIS — Z48812 Encounter for surgical aftercare following surgery on the circulatory system: Secondary | ICD-10-CM | POA: Diagnosis not present

## 2024-06-10 DIAGNOSIS — R9431 Abnormal electrocardiogram [ECG] [EKG]: Secondary | ICD-10-CM | POA: Insufficient documentation

## 2024-06-10 DIAGNOSIS — Z7984 Long term (current) use of oral hypoglycemic drugs: Secondary | ICD-10-CM | POA: Insufficient documentation

## 2024-06-10 DIAGNOSIS — Z8546 Personal history of malignant neoplasm of prostate: Secondary | ICD-10-CM | POA: Insufficient documentation

## 2024-06-10 DIAGNOSIS — Z8674 Personal history of sudden cardiac arrest: Secondary | ICD-10-CM | POA: Insufficient documentation

## 2024-06-10 DIAGNOSIS — Z7902 Long term (current) use of antithrombotics/antiplatelets: Secondary | ICD-10-CM | POA: Insufficient documentation

## 2024-06-10 DIAGNOSIS — I2510 Atherosclerotic heart disease of native coronary artery without angina pectoris: Secondary | ICD-10-CM

## 2024-06-10 DIAGNOSIS — I252 Old myocardial infarction: Secondary | ICD-10-CM | POA: Diagnosis not present

## 2024-06-10 DIAGNOSIS — I213 ST elevation (STEMI) myocardial infarction of unspecified site: Secondary | ICD-10-CM

## 2024-06-10 DIAGNOSIS — I119 Hypertensive heart disease without heart failure: Secondary | ICD-10-CM | POA: Diagnosis not present

## 2024-06-10 LAB — COMPREHENSIVE METABOLIC PANEL WITH GFR
ALT: 19 U/L (ref 0–44)
AST: 16 U/L (ref 15–41)
Albumin: 3.4 g/dL — ABNORMAL LOW (ref 3.5–5.0)
Alkaline Phosphatase: 74 U/L (ref 38–126)
Anion gap: 7 (ref 5–15)
BUN: 13 mg/dL (ref 8–23)
CO2: 25 mmol/L (ref 22–32)
Calcium: 8.7 mg/dL — ABNORMAL LOW (ref 8.9–10.3)
Chloride: 105 mmol/L (ref 98–111)
Creatinine, Ser: 0.78 mg/dL (ref 0.61–1.24)
GFR, Estimated: >60 mL/min (ref >60)
Glucose, Bld: 89 mg/dL (ref 70–99)
Potassium: 4.1 mmol/L (ref 3.5–5.1)
Sodium: 137 mmol/L (ref 135–145)
Total Bilirubin: 0.7 mg/dL (ref 0.0–1.2)
Total Protein: 6.6 g/dL (ref 6.5–8.1)

## 2024-06-10 LAB — ECHOCARDIOGRAM COMPLETE
Area-P 1/2: 3.12 cm2
MV M vel: 4.79 m/s
MV Peak grad: 91.8 mmHg
S' Lateral: 2.7 cm
Single Plane A4C EF: 52.3 %

## 2024-06-10 LAB — BRAIN NATRIURETIC PEPTIDE: B Natriuretic Peptide: 414.2 pg/mL — ABNORMAL HIGH (ref 0.0–100.0)

## 2024-06-10 NOTE — Patient Instructions (Signed)
 Medication Changes:  None, continue current medications  Lab Work:  Labs done today, your results will be available in MyChart, we will contact you for abnormal readings.  Testing/Procedures:  Your provider has recommended that  you wear a Zio Patch for 7 days.  This monitor will record your heart rhythm for our review.  IF you have any symptoms while wearing the monitor please press the button.  If you have any issues with the patch or you notice a red or orange light on it please call the company at [phone].  Once you remove the patch please mail it back to the company as soon as possible so we can get the results. REMOVE ON [facility] 06/17/24  Referrals:  You have been referred to General Cardiology, they will call you for an appointment  Special Instructions // Education:  Do the following things EVERYDAY: Weigh yourself in the morning before breakfast. Write it down and keep it in a log. Take your medicines as prescribed Eat low salt foods--Limit salt (sodium) to 2000 mg per day.  Stay as active as you can everyday Limit all fluids for the day to less than 2 liters   Follow-Up in: CONGRATUATIONS!!!! YOU HAVE GRADUATED THE [facility]. You have been referred to follow-up with general cardiology, they will call you for an appointment.   At the [facility], you and your health needs are our priority. We have a designated team specialized in the treatment of Heart Failure. This Care Team includes your primary Heart Failure Specialized Cardiologist (physician), Advanced Practice Providers (APPs- Physician Assistants and Nurse Practitioners), and Pharmacist who all work together to provide you with the care you need, when you need it.   You may see any of the following providers on your designated Care Team at your next follow up:  Dr. Toribio Fuel Dr. Ezra Shuck Dr. Odis Brownie Greig Mosses, NP Caffie Shed, [place] [facility] [place], [place] Beckey Coe, NP Jordan Lee, NP Tinnie Redman, PharmD   Please be sure to bring in all your medications bottles to every appointment.   Need to Contact Us :  If you have any questions or concerns before your next appointment please send us  a message through [place] or call our office at [phone].    TO LEAVE A MESSAGE FOR THE NURSE SELECT OPTION 2, PLEASE LEAVE A MESSAGE INCLUDING: YOUR NAME DATE OF BIRTH CALL BACK NUMBER REASON FOR CALL**this is important as we prioritize the call backs  YOU WILL RECEIVE A CALL BACK THE SAME DAY AS LONG AS YOU CALL BEFORE 4:00 PM

## 2024-06-10 NOTE — Progress Notes (Signed)
 Zio patch placed onto patient.  All instructions and information reviewed with patient, they verbalize understanding with no questions.

## 2024-06-10 NOTE — Addendum Note (Signed)
 Encounter addended by: Elinda Rolin SAILOR, [place] on: 06/10/2024 1:54 PM  Actions taken: Clinical Note Signed

## 2024-06-10 NOTE — Addendum Note (Signed)
 Encounter addended by: Buell Powell HERO, RN on: 06/10/2024 10:54 AM  Actions taken: Visit diagnoses modified, Order list changed, Diagnosis association updated, Pend clinical note, Clinical Note Signed, Charge Capture section accepted

## 2024-06-10 NOTE — Progress Notes (Signed)
  Echocardiogram 2D Echocardiogram has been performed.  Koleen KANDICE Popper, RDCS 06/10/2024, 9:38 AM

## 2024-06-11 ENCOUNTER — Other Ambulatory Visit: Payer: Self-pay

## 2024-06-12 ENCOUNTER — Other Ambulatory Visit: Payer: Self-pay

## 2024-06-12 ENCOUNTER — Encounter (HOSPITAL_COMMUNITY)
Admission: RE | Admit: 2024-06-12 | Discharge: 2024-06-12 | Disposition: A | Discharge status: Home / Self Care | Source: Ambulatory Visit | Attending: Internal Medicine | Admitting: Internal Medicine

## 2024-06-12 DIAGNOSIS — I213 ST elevation (STEMI) myocardial infarction of unspecified site: Secondary | ICD-10-CM

## 2024-06-12 DIAGNOSIS — Z955 Presence of coronary angioplasty implant and graft: Secondary | ICD-10-CM

## 2024-06-14 ENCOUNTER — Encounter (HOSPITAL_COMMUNITY)

## 2024-06-17 ENCOUNTER — Encounter (HOSPITAL_COMMUNITY)
Admission: RE | Admit: 2024-06-17 | Discharge: 2024-06-17 | Disposition: A | Discharge status: Home / Self Care | Source: Ambulatory Visit | Attending: Internal Medicine | Admitting: Internal Medicine

## 2024-06-17 ENCOUNTER — Encounter (HOSPITAL_COMMUNITY)

## 2024-06-17 DIAGNOSIS — Z955 Presence of coronary angioplasty implant and graft: Secondary | ICD-10-CM

## 2024-06-17 DIAGNOSIS — I213 ST elevation (STEMI) myocardial infarction of unspecified site: Secondary | ICD-10-CM

## 2024-06-19 ENCOUNTER — Encounter (HOSPITAL_COMMUNITY)
Admission: RE | Admit: 2024-06-19 | Discharge: 2024-06-19 | Disposition: A | Discharge status: Home / Self Care | Source: Ambulatory Visit | Attending: Internal Medicine | Admitting: Internal Medicine

## 2024-06-19 DIAGNOSIS — Z955 Presence of coronary angioplasty implant and graft: Secondary | ICD-10-CM

## 2024-06-19 DIAGNOSIS — I213 ST elevation (STEMI) myocardial infarction of unspecified site: Secondary | ICD-10-CM

## 2024-06-20 ENCOUNTER — Other Ambulatory Visit: Payer: Self-pay

## 2024-06-21 ENCOUNTER — Encounter (HOSPITAL_COMMUNITY)

## 2024-06-24 ENCOUNTER — Encounter (HOSPITAL_COMMUNITY)
Admission: RE | Admit: 2024-06-24 | Discharge: 2024-06-24 | Disposition: A | Discharge status: Home / Self Care | Source: Ambulatory Visit | Attending: Internal Medicine | Admitting: Internal Medicine

## 2024-06-24 ENCOUNTER — Encounter (HOSPITAL_COMMUNITY)

## 2024-06-24 DIAGNOSIS — I213 ST elevation (STEMI) myocardial infarction of unspecified site: Secondary | ICD-10-CM

## 2024-06-24 DIAGNOSIS — Z955 Presence of coronary angioplasty implant and graft: Secondary | ICD-10-CM

## 2024-06-25 ENCOUNTER — Ambulatory Visit: Admitting: Family Medicine

## 2024-06-26 ENCOUNTER — Encounter (HOSPITAL_COMMUNITY)
Admission: RE | Admit: 2024-06-26 | Discharge: 2024-06-26 | Disposition: A | Discharge status: Home / Self Care | Source: Ambulatory Visit | Attending: Internal Medicine | Admitting: Internal Medicine

## 2024-06-26 DIAGNOSIS — Z955 Presence of coronary angioplasty implant and graft: Secondary | ICD-10-CM

## 2024-06-26 DIAGNOSIS — I213 ST elevation (STEMI) myocardial infarction of unspecified site: Secondary | ICD-10-CM

## 2024-06-28 ENCOUNTER — Other Ambulatory Visit: Payer: Self-pay

## 2024-06-28 ENCOUNTER — Encounter (HOSPITAL_COMMUNITY)

## 2024-07-01 ENCOUNTER — Other Ambulatory Visit: Payer: Self-pay | Admitting: Internal Medicine

## 2024-07-01 ENCOUNTER — Ambulatory Visit: Attending: Internal Medicine | Admitting: Internal Medicine

## 2024-07-01 ENCOUNTER — Encounter (HOSPITAL_COMMUNITY)
Admission: RE | Admit: 2024-07-01 | Discharge: 2024-07-01 | Disposition: A | Source: Ambulatory Visit | Attending: Internal Medicine

## 2024-07-01 ENCOUNTER — Encounter (HOSPITAL_COMMUNITY)

## 2024-07-01 ENCOUNTER — Other Ambulatory Visit: Payer: Self-pay

## 2024-07-01 ENCOUNTER — Encounter: Payer: Self-pay | Admitting: Internal Medicine

## 2024-07-01 VITALS — BP 112/70 | HR 66 | Temp 97.7°F | Ht 66.0 in | Wt 162.0 lb

## 2024-07-01 DIAGNOSIS — Z23 Encounter for immunization: Secondary | ICD-10-CM

## 2024-07-01 DIAGNOSIS — L309 Dermatitis, unspecified: Secondary | ICD-10-CM

## 2024-07-01 DIAGNOSIS — Z955 Presence of coronary angioplasty implant and graft: Secondary | ICD-10-CM | POA: Insufficient documentation

## 2024-07-01 DIAGNOSIS — I213 ST elevation (STEMI) myocardial infarction of unspecified site: Secondary | ICD-10-CM | POA: Insufficient documentation

## 2024-07-01 MED ORDER — TRIAMCINOLONE ACETONIDE 0.1 % EX CREA
1.0000 | TOPICAL_CREAM | Freq: Two times a day (BID) | CUTANEOUS | 1 refills | Status: DC
  Filled 2024-07-01: qty 30, 15d supply, fill #0
  Filled 2024-07-09 – 2024-07-10 (×2): qty 30, 15d supply, fill #1

## 2024-07-01 NOTE — Patient Instructions (Signed)
  VISIT SUMMARY: Today, you were seen for a recurrent rash on your abdomen. The rash had previously appeared on your arms and underarms but has since resolved in those areas. However, it has reappeared around your belly button over the past week. You were previously treated with triamcinolone  cream, which was effective, but you have run out of the cream and the rash has partially returned.  YOUR PLAN: -RECURRENT DERMATITIS OF THE ABDOMEN: Recurrent dermatitis is a condition where the skin becomes inflamed and develops a rash. It previously resolved with triamcinolone  cream, which you have run out of. We have prescribed triamcinolone  cream again for you to apply to the affected area on your abdomen. The prescription has been sent to your pharmacy.  INSTRUCTIONS: Please follow up with your primary care physician, Dr. Franky, in January.                      Contains text generated by Abridge.                                 Contains text generated by Abridge.

## 2024-07-01 NOTE — Progress Notes (Signed)
 Patient ID: Jeffery Graves, male    DOB: 10-09-1955  MRN: [id number]  CC: Rash (Rash on abdomen X1 week/Flu vax administered on 07/01/24 - C.A.)   Subjective: Jeffery Graves is a 68 y.o. male who presents for UC visit. PCP is Dr. Delbert His concerns today include:  Hx of HTN, CAD, prostate CA   Discussed the use of AI scribe software for clinical note transcription with the patient, who gave verbal consent to proceed.  History of Present Illness   Jeffery Graves is a 68 year old male who presents with a recurrent rash on the abdomen.  He has a recurrent rash that initially appeared on his arms, underarms, and around his lower abdomen. The rash on his arms and underarms has resolved, but a small rash has reappeared around his belly button over the past week.  He was previously seen in October for the same issue and was prescribed triamcinolone  cream, which effectively cleared the rash at that time. However, he has since run out of the cream and the rash has partially returned around the umbilicus.  He denies any itching associated with the rash and reports no changes in body products, lotions, soaps, or deodorants. He has not used deodorant since the initial rash appeared.     HM: had told CMA he did not want flu shot. I spoke with him about it and pt changed his mind and is agreeable to receiving it.  Patient Active Problem List   Diagnosis Date Noted   Ischemic cardiomyopathy 03/18/2024   Hypokalemia 03/18/2024   Cardiac arrest (HCC) 03/16/2024   STEMI (ST elevation myocardial infarction) (HCC) 03/15/2024   Prostate cancer (HCC) 03/06/2024   Vitamin D  deficiency 03/13/2014   BPH (benign prostatic hyperplasia) 10/04/2013   DENTAL CARIES 02/10/2010   ERECTILE DYSFUNCTION, SECONDARY TO MEDICATION 02/26/2009   HYPERTENSION, BENIGN ESSENTIAL 08/02/2007     Current Outpatient Medications on File Prior to Visit  Medication Sig Dispense Refill   aspirin  81 MG chewable tablet Chew 1  tablet (81 mg total) by mouth daily. 90 tablet 2   atorvastatin  (LIPITOR ) 80 MG tablet Take 1 tablet (80 mg total) by mouth daily. 90 tablet 2   clopidogrel  (PLAVIX ) 75 MG tablet Take 1 tablet (75 mg total) by mouth daily. 90 tablet 2   dapagliflozin  propanediol (FARXIGA ) 10 MG TABS tablet Take 1 tablet (10 mg total) by mouth daily before breakfast. 30 tablet 6   ENTRESTO  97-103 MG Take 1 tablet by mouth 2 (two) times daily. 90 tablet 2   metoprolol  succinate (TOPROL -XL) 25 MG 24 hr tablet Take 1 tablet (25 mg total) by mouth daily. 90 tablet 2   nitroGLYCERIN  (NITROSTAT ) 0.4 MG SL tablet Place 1 tablet (0.4 mg total) under the tongue every 5 (five) minutes as needed for chest pain. 25 tablet 3   spironolactone  (ALDACTONE ) 25 MG tablet Take 1 tablet (25 mg total) by mouth daily. 90 tablet 2   tamsulosin  (FLOMAX ) 0.4 MG CAPS capsule Take 1 capsule (0.4 mg total) by mouth 2 (two) times daily. 60 capsule 11   Brimonidine  Tartrate (LUMIFY  OP) Place 1 drop into both eyes daily. (Patient not taking: Reported on 07/01/2024)     potassium chloride  SA (KLOR-CON  M) 20 MEQ tablet Take 1 tablet (20 mEq total) by mouth daily for 10 days. (Patient not taking: Reported on 07/01/2024) 10 tablet 0   senna-docusate (SENOKOT-S) 8.6-50 MG tablet Take 1 tablet by mouth 2 (two) times daily. While  taking pain meds to prevent constipation (Patient not taking: Reported on 07/01/2024) 10 tablet 0   XTANDI 80 MG tablet Take 80 mg by mouth daily. (Patient not taking: Reported on 07/01/2024)     No current facility-administered medications on file prior to visit.    No Known Allergies  Social History   Socioeconomic History   Marital status: Single    Spouse name: Not on file   Number of children: Not on file   Years of education: Not on file   Highest education level: Patient refused  Occupational History   Occupation: Retired  Tobacco Use   Smoking status: Never   Smokeless tobacco: Never  Vaping Use   Vaping  status: Never Used  Substance and Sexual Activity   Alcohol use: Never    Alcohol/week: 7.0 standard drinks of alcohol    Types: 7 Standard drinks or equivalent per week   Drug use: No   Sexual activity: Not Currently  Other Topics Concern   Not on file  Social History Narrative   Not on file   Social Drivers of Health   Financial Resource Strain: Not on file  Food Insecurity: No Food Insecurity (03/17/2024)   Hunger Vital Sign    Worried About Running Out of Food in the Last Year: Never true    Ran Out of Food in the Last Year: Never true  Transportation Needs: No Transportation Needs (03/17/2024)   PRAPARE - Administrator, Civil Service (Medical): No    Lack of Transportation (Non-Medical): No  Physical Activity: Not on file  Stress: Not on file  Social Connections: Unknown (03/17/2024)   Social Connection and Isolation Panel    Frequency of Communication with Friends and Family: Patient declined    Frequency of Social Gatherings with Friends and Family: Patient declined    Attends Religious Services: Patient declined    Database Administrator or Organizations: Not on file    Attends Banker Meetings: Not on file    Marital Status: Patient declined  Intimate Partner Violence: Not At Risk (03/17/2024)   Humiliation, Afraid, Rape, and Kick questionnaire    Fear of Current or Ex-Partner: No    Emotionally Abused: No    Physically Abused: No    Sexually Abused: No    Family History  Problem Relation Age of Onset   Stroke Mother    Colon cancer Neg Hx    Colon polyps Neg Hx    Esophageal cancer Neg Hx    Stomach cancer Neg Hx    Rectal cancer Neg Hx     Past Surgical History:  Procedure Laterality Date   BIOPSY PROSTATE     CARDIAC CATHETERIZATION     CORONARY/GRAFT ACUTE MI REVASCULARIZATION N/A 03/15/2024   Procedure: Coronary/Graft Acute MI Revascularization;  Surgeon: Elmira Newman PARAS, MD;  Location: [facility];  Service:  Cardiovascular;  Laterality: N/A;   CRYOABLATION N/A 03/06/2024   Procedure: CRYOABLATION, PROSTATE, CYSTOSCOPY;  Surgeon: Alvaro Ricardo KATHEE Mickey., MD;  Location: [facility];  Service: Urology;  Laterality: N/A;    ROS: Review of Systems Negative except as stated above  PHYSICAL EXAM: BP 112/70 (BP Location: Left Arm, Patient Position: Sitting, Cuff Size: Normal)   Pulse 66   Temp 97.7 F (36.5 C) (Oral)   Ht 5' 6 (1.676 m)   Wt 162 lb (73.5 kg)   SpO2 100%   BMI 26.15 kg/m   Physical Exam   General appearance -  alert, well appearing, older AAM and in no distress Mental status - normal mood, behavior, speech, dress, motor activity, and thought processes Skin - fine papular rash noted over lower abdomen       Latest Ref Rng & Units 06/10/2024   11:03 AM 04/29/2024    8:40 AM 04/08/2024    9:31 AM  CMP  Glucose 70 - 99 mg/dL 89  899  79   BUN 8 - 23 mg/dL 13  12  12    Creatinine 0.61 - 1.24 mg/dL 9.21  9.04  9.02   Sodium 135 - 145 mmol/L 137  135  138   Potassium 3.5 - 5.1 mmol/L 4.1  4.1  3.4   Chloride 98 - 111 mmol/L 105  104  105   CO2 22 - 32 mmol/L 25  23  24    Calcium  8.9 - 10.3 mg/dL 8.7  8.9  8.3   Total Protein 6.5 - 8.1 g/dL 6.6     Total Bilirubin 0.0 - 1.2 mg/dL 0.7     Alkaline Phos 38 - 126 U/L 74     AST 15 - 41 U/L 16     ALT 0 - 44 U/L 19      Lipid Panel     Component Value Date/Time   CHOL 164 03/15/2024 1139   CHOL 199 12/27/2022 0920   TRIG 39 03/15/2024 1139   HDL 52 03/15/2024 1139   HDL 87 12/27/2022 0920   CHOLHDL 3.2 03/15/2024 1139   VLDL 8 03/15/2024 1139   LDLCALC 104 (H) 03/15/2024 1139   LDLCALC 102 (H) 12/27/2022 0920    CBC    Component Value Date/Time   WBC 9.0 03/19/2024 0209   RBC 4.20 (L) 03/19/2024 0209   HGB 11.3 (L) 03/19/2024 0209   HGB 13.5 02/12/2024 0931   HCT 34.2 (L) 03/19/2024 0209   HCT 40.6 02/12/2024 0931   PLT 189 03/19/2024 0209   PLT 187 02/12/2024 0931   MCV 81.4 03/19/2024 0209   MCV 82 02/12/2024  0931   MCH 26.9 03/19/2024 0209   MCHC 33.0 03/19/2024 0209   RDW 13.8 03/19/2024 0209   RDW 13.3 02/12/2024 0931   LYMPHSABS 2.9 03/15/2024 1139   LYMPHSABS 1.6 02/12/2024 0931   MONOABS 1.0 03/15/2024 1139   EOSABS 0.2 03/15/2024 1139   EOSABS 0.1 02/12/2024 0931   BASOSABS 0.1 03/15/2024 1139   BASOSABS 0.0 02/12/2024 0931    ASSESSMENT AND PLAN: 1. Dermatitis (Primary) Of questionable etiology but responded well to Triamcinolone  cream Will RF Triamcinolone  cream - triamcinolone  cream (KENALOG ) 0.1 %; Apply 1 Application topically 2 (two) times daily.  Dispense: 30 g; Refill: 1  2. Need for influenza vaccination Given today.    Patient was given the opportunity to ask questions.  Patient verbalized understanding of the plan and was able to repeat key elements of the plan.   This documentation was completed using Paediatric nurse.  Any transcriptional errors are unintentional.  Orders Placed This Encounter  Procedures   Flu vaccine HIGH DOSE PF(Fluzone Trivalent)     Requested Prescriptions   Signed Prescriptions Disp Refills   triamcinolone  cream (KENALOG ) 0.1 % 30 g 1    Sig: Apply 1 Application topically 2 (two) times daily.    Return if symptoms worsen or fail to improve.  Barnie Louder, MD, FACP

## 2024-07-02 NOTE — Progress Notes (Signed)
 Cardiac Individual Treatment Plan  Patient Details  Name: Jeffery Graves MRN: [id number] Date of Birth: 1956/03/03 Referring Provider:   Flowsheet Row INTENSIVE CARDIAC REHAB ORIENT from 04/18/2024 in [facility]  Referring Provider Dr. Toribio Fuel, MD    Initial Encounter Date:  Flowsheet Row INTENSIVE CARDIAC REHAB ORIENT from 04/18/2024 in [facility]  Date 04/18/24    Visit Diagnosis: 03/15/24 STEMI  03/15/24 DES RPL  Patient's Home Medications on Admission:  Current Outpatient Medications:    aspirin  81 MG chewable tablet, Chew 1 tablet (81 mg total) by mouth daily., Disp: 90 tablet, Rfl: 2   atorvastatin  (LIPITOR ) 80 MG tablet, Take 1 tablet (80 mg total) by mouth daily., Disp: 90 tablet, Rfl: 2   Brimonidine  Tartrate (LUMIFY  OP), Place 1 drop into both eyes daily. (Patient not taking: Reported on 07/01/2024), Disp: , Rfl:    clopidogrel  (PLAVIX ) 75 MG tablet, Take 1 tablet (75 mg total) by mouth daily., Disp: 90 tablet, Rfl: 2   dapagliflozin  propanediol (FARXIGA ) 10 MG TABS tablet, Take 1 tablet (10 mg total) by mouth daily before breakfast., Disp: 30 tablet, Rfl: 6   ENTRESTO  97-103 MG, Take 1 tablet by mouth 2 (two) times daily., Disp: 90 tablet, Rfl: 2   metoprolol  succinate (TOPROL -XL) 25 MG 24 hr tablet, Take 1 tablet (25 mg total) by mouth daily., Disp: 90 tablet, Rfl: 2   nitroGLYCERIN  (NITROSTAT ) 0.4 MG SL tablet, Place 1 tablet (0.4 mg total) under the tongue every 5 (five) minutes as needed for chest pain., Disp: 25 tablet, Rfl: 3   potassium chloride  SA (KLOR-CON  M) 20 MEQ tablet, Take 1 tablet (20 mEq total) by mouth daily for 10 days. (Patient not taking: Reported on 07/01/2024), Disp: 10 tablet, Rfl: 0   senna-docusate (SENOKOT-S) 8.6-50 MG tablet, Take 1 tablet by mouth 2 (two) times daily. While taking pain meds to prevent constipation (Patient not  taking: Reported on 07/01/2024), Disp: 10 tablet, Rfl: 0   spironolactone  (ALDACTONE ) 25 MG tablet, Take 1 tablet (25 mg total) by mouth daily., Disp: 90 tablet, Rfl: 2   tamsulosin  (FLOMAX ) 0.4 MG CAPS capsule, Take 1 capsule (0.4 mg total) by mouth 2 (two) times daily., Disp: 60 capsule, Rfl: 11   triamcinolone  cream (KENALOG ) 0.1 %, Apply 1 Application topically 2 (two) times daily., Disp: 30 g, Rfl: 1   XTANDI 80 MG tablet, Take 80 mg by mouth daily. (Patient not taking: Reported on 07/01/2024), Disp: , Rfl:   Past Medical History: Past Medical History:  Diagnosis Date   BPH (benign prostatic hyperplasia)    Coronary artery disease    Hx of radiation therapy 09/06/10 - 10/29/10   prostate, seminal vesicles   Hypertension    on meds   Prostate cancer (HCC) 04/26/2010   gleason 7, vol 20.88 cc    Tobacco Use: Social History   Tobacco Use  Smoking Status Never  Smokeless Tobacco Never    Labs: Review Flowsheet  More data exists      Latest Ref Rng & Units 06/21/2022 12/27/2022 02/12/2024 03/15/2024 03/16/2024  Labs for ITP Cardiac and Pulmonary Rehab  Cholestrol 0 - 200 mg/dL - 800  - 835  -  LDL (calc) 0 - 99 mg/dL - 897  - 895  -  HDL-C >40 mg/dL - 87  - 52  -  Trlycerides <150 mg/dL - 56  - 39  -  Hemoglobin A1c 4.8 - 5.6 % 5.1  - 5.4  5.2  5.2   PH, Arterial 7.35 - 7.45 - - - 7.371  -  PCO2 arterial 32 - 48 mmHg - - - 37.9  -  Bicarbonate 20.0 - 28.0 mmol/L - - - 21.9  -  TCO2 22 - 32 mmol/L - - - 23  -  Acid-base deficit 0.0 - 2.0 mmol/L - - - 3.0  -  O2 Saturation % - - - 100  -     Exercise Target Goals: Exercise Program Goal: Individual exercise prescription set using results from initial 6 min walk test and THRR while considering  patient's activity barriers and safety.   Exercise Prescription Goal: Initial exercise prescription builds to 30-45 minutes a day of aerobic activity, 2-3 days per week.  Home exercise guidelines will be given to patient during program  as part of exercise prescription that the participant will acknowledge.   Education: Aerobic Exercise: - Group verbal and visual presentation on the components of exercise prescription. Introduces F.I.T.T principle from ACSM for exercise prescriptions.  Reviews F.I.T.T. principles of aerobic exercise including progression. Written material provided at class time.   Education: Resistance Exercise: - Group verbal and visual presentation on the components of exercise prescription. Introduces F.I.T.T principle from ACSM for exercise prescriptions  Reviews F.I.T.T. principles of resistance exercise including progression. Written material provided at class time.    Education: Exercise & Equipment Safety: - Individual verbal instruction and demonstration of equipment use and safety with use of the equipment.   Education: Exercise Physiology & General Exercise Guidelines: - Group verbal and written instruction with models to review the exercise physiology of the cardiovascular system and associated critical values. Provides general exercise guidelines with specific guidelines to those with heart or lung disease. Written material provided at class time.   Education: Flexibility, Balance, Mind/Body Relaxation: - Group verbal and visual presentation with interactive activity on the components of exercise prescription. Introduces F.I.T.T principle from ACSM for exercise prescriptions. Reviews F.I.T.T. principles of flexibility and balance exercise training including progression. Also discusses the mind body connection.  Reviews various relaxation techniques to help reduce and manage stress (i.e. Deep breathing, progressive muscle relaxation, and visualization). Balance handout provided to take home. Written material provided at class time.   Activity Barriers & Risk Stratification:  Activity Barriers & Cardiac Risk Stratification - 04/18/24 1015       Activity Barriers & Cardiac Risk Stratification    Activity Barriers None    Cardiac Risk Stratification High          6 Minute Walk:  6 Minute Walk     Row Name 04/18/24 1007         6 Minute Walk   Phase Initial     Distance 1390 feet     Walk Time 6 minutes     # of Rest Breaks 0     MPH 2.63     METS 3.41     RPE 8     Perceived Dyspnea  0     VO2 Peak 11.94     Symptoms No     Resting HR 70 bpm     Resting BP 102/62     Resting Oxygen Saturation  100 %     Exercise Oxygen Saturation  during 6 min walk 99 %     Max Ex. HR 114 bpm     Max Ex. BP 126/76     2 Minute  Post BP 112/72        Oxygen Initial Assessment:   Oxygen Re-Evaluation:   Oxygen Discharge (Final Oxygen Re-Evaluation):   Initial Exercise Prescription:  Initial Exercise Prescription - 04/18/24 1000       Date of Initial Exercise RX and Referring Provider   Date 04/18/24    Referring Provider Dr. Toribio Fuel, MD    Expected Discharge Date 07/12/24      Treadmill   MPH 2.5    Grade 0    Minutes 15    METs 2.91      Recumbant Elliptical   Level 2    RPM 60    Watts 80    Minutes 15    METs 3.2      Prescription Details   Frequency (times per week) 2    Duration Progress to 30 minutes of continuous aerobic without signs/symptoms of physical distress      Intensity   THRR 40-80% of Max Heartrate 62-123    Ratings of Perceived Exertion 11-13    Perceived Dyspnea 0-4      Progression   Progression Continue progressive overload as per policy without signs/symptoms or physical distress.      Resistance Training   Training Prescription Yes    Weight 3 lbs    Reps 10-15          Perform Capillary Blood Glucose checks as needed.  Exercise Prescription Changes:   Exercise Prescription Changes     Row Name 04/22/24 0833 05/15/24 0800 06/03/24 0831 06/19/24 0827       Response to Exercise   Blood Pressure (Admit) 132/70 145/74 120/68 120/68    Blood Pressure (Exercise) 140/68 -- -- --    Blood Pressure (Exit)  110/60 108/70 112/66 112/66    Heart Rate (Admit) 61 bpm 93 bpm 66 bpm 66 bpm    Heart Rate (Exercise) 107 bpm 106 bpm 113 bpm 113 bpm    Heart Rate (Exit) 69 bpm 81 bpm 81 bpm 81 bpm    Rating of Perceived Exertion (Exercise) 8 6 9 9     Perceived Dyspnea (Exercise) 0 0 0 0    Symptoms none none none none    Comments First day in the cardiac rehab [facility] program Reviewed MET's, goals and home ExRx Reviewed MET's Reviewed MET's and goals    Duration Progress to 30 minutes of  aerobic without signs/symptoms of physical distress Progress to 30 minutes of  aerobic without signs/symptoms of physical distress Progress to 30 minutes of  aerobic without signs/symptoms of physical distress Progress to 30 minutes of  aerobic without signs/symptoms of physical distress    Intensity THRR unchanged THRR unchanged THRR unchanged THRR unchanged      Progression   Progression Continue to progress workloads to maintain intensity without signs/symptoms of physical distress. Continue to progress workloads to maintain intensity without signs/symptoms of physical distress. Continue to progress workloads to maintain intensity without signs/symptoms of physical distress. Continue to progress workloads to maintain intensity without signs/symptoms of physical distress.    Average METs 2.71 2.91 3.61 3.45      Resistance Training   Training Prescription Yes No No No    Weight 3 lbs 3 lbs 5 5    Reps 10-15 10-15 10-15 10-15    Time 10 Minutes 10 Minutes 10 Minutes 10 Minutes      Treadmill   MPH 2.5 3  stayed on the TM 30 Mins 3  stayed on the TM  30 Mins 3  stayed on the TM 30 Mins    Grade 0 0 1 0    Minutes 15 30 15 15     METs 2.91 3.3 3.71 3.3      Recumbant Elliptical   Level 2 -- 3 3    RPM 39 -- 64 54    Watts 50 -- 52 66    Minutes 15 -- 15 15    METs 2.5 -- 3.5 3.6      Home Exercise Plan   Plans to continue exercise at -- Home (comment) Home (comment) Home (comment)    Frequency -- Add 2  additional days to program exercise sessions. Add 2 additional days to program exercise sessions. Add 2 additional days to program exercise sessions.    Initial Home Exercises Provided -- 05/15/24 05/15/24 05/15/24       Exercise Comments:   Exercise Comments     Row Name 04/22/24 0845 05/15/24 0811 06/03/24 0833 06/19/24 0830     Exercise Comments Pt first day in the [facility] I[facility] program. Pt tolerated exercise well with an average MET level of 2.71. He is off to a good start and is learning his THRR, RPE and ExRx REVD MET's, goals and home ExRx. Pt tolerated exercise well with an average MET level of 3.12. Heis doing well and progressing MET's. He has a rash today, onsite provider aware, he has an appointment later this month to be seen. He will continue to exercise by going to [employer] [facility] 2 days for 30-45 mins per session. He feels good about his goals and is increasing strength. REVD MET's. Pt tolerated exercise well with an average MET level of 3.61. He is doing well and progressing MET's and WL's. He says he has been working out on his own and eating better. REVD MET's and goals. Pt tolerated exercise well with an average MET level of 3.45. He is feeling good with exercise. Will review with him why he decreased on TM, this may have been a data entry error, but overall, he feels good       Exercise Goals and Review:   Exercise Goals     Row Name 04/18/24 1018             Exercise Goals   Increase Physical Activity Yes       Intervention Provide advice, education, support and counseling about physical activity/exercise needs.;Develop an individualized exercise prescription for aerobic and resistive training based on initial evaluation findings, risk stratification, comorbidities and participant's personal goals.       Expected Outcomes Short Term: Attend rehab on a regular basis to increase amount of physical activity.;Long Term: Exercising regularly at least 3-5 days a week.;Long Term:  Add in home exercise to make exercise part of routine and to increase amount of physical activity.       Increase Strength and Stamina Yes       Intervention Provide advice, education, support and counseling about physical activity/exercise needs.;Develop an individualized exercise prescription for aerobic and resistive training based on initial evaluation findings, risk stratification, comorbidities and participant's personal goals.       Expected Outcomes Short Term: Increase workloads from initial exercise prescription for resistance, speed, and METs.;Short Term: Perform resistance training exercises routinely during rehab and add in resistance training at home;Long Term: Improve cardiorespiratory fitness, muscular endurance and strength as measured by increased METs and functional capacity ( )       Able to understand and use rate of  perceived exertion (RPE) scale Yes       Intervention Provide education and explanation on how to use RPE scale       Expected Outcomes Short Term: Able to use RPE daily in rehab to express subjective intensity level;Long Term:  Able to use RPE to guide intensity level when exercising independently       Knowledge and understanding of Target Heart Rate Range (THRR) Yes       Intervention Provide education and explanation of THRR including how the numbers were predicted and where they are located for reference       Expected Outcomes Short Term: Able to state/look up THRR;Long Term: Able to use THRR to govern intensity when exercising independently;Short Term: Able to use daily as guideline for intensity in rehab       Understanding of Exercise Prescription Yes       Intervention Provide education, explanation, and written materials on patient's individual exercise prescription       Expected Outcomes Short Term: Able to explain program exercise prescription;Long Term: Able to explain home exercise prescription to exercise independently          Exercise Goals  Re-Evaluation :  Exercise Goals Re-Evaluation     Row Name 04/22/24 0839 05/15/24 0805 06/19/24 0828         Exercise Goal Re-Evaluation   Exercise Goals Review Increase Physical Activity;Understanding of Exercise Prescription;Increase Strength and Stamina;Knowledge and understanding of Target Heart Rate Range (THRR);Able to understand and use rate of perceived exertion (RPE) scale Increase Physical Activity;Understanding of Exercise Prescription;Increase Strength and Stamina;Knowledge and understanding of Target Heart Rate Range (THRR);Able to understand and use rate of perceived exertion (RPE) scale Increase Physical Activity;Understanding of Exercise Prescription;Increase Strength and Stamina;Knowledge and understanding of Target Heart Rate Range (THRR);Able to understand and use rate of perceived exertion (RPE) scale     Comments Pt first day in the [facility] program. Pt tolerated exercise well with an average MET level of 2.71. He is off to a good start and is learning his THRR, RPE and ExRx REVD MET's, goals and home ExRx. Pt tolerated exercise well with an average MET level of 3.12. He is doing well and progressing MET's. He has a rash today, onsite provider aware, he has an appointment later this month to be seen. He will continue to exercise by going to [employer] [facility] 2 days for 30-45 mins per session. He feels good about his goals and is increasing strength. REVD MET's and goals. Pt tolerated exercise well with an average MET level of 3.45. He is feeling good with exercise. Will review with him why he decreased on TM, this may have been a data entry error,  but overall, he feels good     Expected Outcomes Will continue to monitor pt and progress workloads as tolerated without sign or symptom Will continue to monitor pt and progress workloads as tolerated without sign or symptom Will continue to monitor pt and progress workloads as tolerated without sign or symptom        Discharge Exercise  Prescription (Final Exercise Prescription Changes):  Exercise Prescription Changes - 06/19/24 0827       Response to Exercise   Blood Pressure (Admit) 120/68    Blood Pressure (Exit) 112/66    Heart Rate (Admit) 66 bpm    Heart Rate (Exercise) 113 bpm    Heart Rate (Exit) 81 bpm    Rating of Perceived Exertion (Exercise) 9    Perceived Dyspnea (  Exercise) 0    Symptoms none    Comments Reviewed MET's and goals    Duration Progress to 30 minutes of  aerobic without signs/symptoms of physical distress    Intensity THRR unchanged      Progression   Progression Continue to progress workloads to maintain intensity without signs/symptoms of physical distress.    Average METs 3.45      Resistance Training   Training Prescription No    Weight 5    Reps 10-15    Time 10 Minutes      Treadmill   MPH 3   stayed on the TM 30 Mins   Grade 0    Minutes 15    METs 3.3      Recumbant Elliptical   Level 3    RPM 54    Watts 66    Minutes 15    METs 3.6      Home Exercise Plan   Plans to continue exercise at Home (comment)    Frequency Add 2 additional days to program exercise sessions.    Initial Home Exercises Provided 05/15/24          Nutrition:  Target Goals: Understanding of nutrition guidelines, daily intake of sodium 1500mg , cholesterol 200mg , calories 30% from fat and 7% or less from saturated fats, daily to have 5 or more servings of fruits and vegetables.  Education: Nutrition 1 -Group instruction provided by verbal, written material, interactive activities, discussions, models, and posters to present general guidelines for heart healthy nutrition including macronutrients, label reading, and promoting whole foods over processed counterparts. Education serves as pensions consultant of discussion of heart healthy eating for all. Written material provided at class time.    Education: Nutrition 2 -Group instruction provided by verbal, written material, interactive activities,  discussions, models, and posters to present general guidelines for heart healthy nutrition including sodium, cholesterol, and saturated fat. Providing guidance of habit forming to improve blood pressure, cholesterol, and body weight. Written material provided at class time.     Biometrics:  Pre Biometrics - 04/18/24 1018       Pre Biometrics   Height 5' 6.75 (1.695 m)    Weight 76.7 kg    Waist Circumference 35 inches    Hip Circumference 38 inches    Waist to Hip Ratio 0.92 %    BMI (Calculated) 26.7    Triceps Skinfold 11 mm    % Body Fat 23.6 %    Grip Strength 15 kg    Flexibility 9 in    Single Leg Stand 24 seconds           Nutrition Therapy Plan and Nutrition Goals:   Nutrition Assessments:  Nutrition Assessments - 04/22/24 0924       MEDFICTS Scores   Pre Score 59         MEDIFICTS Score Key: >=70 Need to make dietary changes  40-70 Heart Healthy Diet <= 40 Therapeutic Level Cholesterol Diet  Flowsheet Row INTENSIVE CARDIAC REHAB from 04/22/2024 in [facility]  Picture Your Plate Total Score on Admission 59   Picture Your Plate Scores: <59 Unhealthy dietary pattern with much room for improvement. 41-50 Dietary pattern unlikely to meet recommendations for good health and room for improvement. 51-60 More healthful dietary pattern, with some room for improvement.  >60 Healthy dietary pattern, although there may be some specific behaviors that could be improved.    Nutrition Goals Re-Evaluation:  Nutrition Goals Discharge (Final Nutrition Goals Re-Evaluation):   Psychosocial: Target Goals: Acknowledge presence or absence of significant depression and/or stress, maximize coping skills, provide positive support system. Participant is able to verbalize types and ability to use techniques and skills needed for reducing stress and depression.   Education: Stress, Anxiety, and Depression - Group  verbal and visual presentation to define topics covered.  Reviews how body is impacted by stress, anxiety, and depression.  Also discusses healthy ways to reduce stress and to treat/manage anxiety and depression. Written material provided at class time.   Education: Sleep Hygiene -Provides group verbal and written instruction about how sleep can affect your health.  Define sleep hygiene, discuss sleep cycles and impact of sleep habits. Review good sleep hygiene tips.   Initial Review & Psychosocial Screening:  Initial Psych Review & Screening - 04/18/24 1307       Initial Review   Current issues with None Identified      Family Dynamics   Good Support System? Yes   Reyn lives alone. Esau has his neice Hadassah and his brother Lynwood for support     Barriers   Psychosocial barriers to participate in program There are no identifiable barriers or psychosocial needs.      Screening Interventions   Interventions Encouraged to exercise          Quality of Life Scores:   Quality of Life - 04/18/24 0859       Quality of Life   Select Quality of Life      Quality of Life Scores   Health/Function Pre 22.86 %    Socioeconomic Pre 19.58 %    Psych/Spiritual Pre 22.5 %    Family Pre 20.83 %    GLOBAL Pre 21.92 %         Scores of 19 and below usually indicate a poorer quality of life in these areas.  A difference of  2-3 points is a clinically meaningful difference.  A difference of 2-3 points in the total score of the Quality of Life Index has been associated with significant improvement in overall quality of life, self-image, physical symptoms, and general health in studies assessing change in quality of life.  PHQ-9: Review Flowsheet       04/18/2024 04/16/2024 06/22/2021 11/04/2015  Depression screen PHQ 2/9  Decreased Interest 0 0 0 0  Down, Depressed, Hopeless 0 0 0 0  PHQ - 2 Score 0 0 0 0  Altered sleeping 0 0 0 0  Tired, decreased energy 0 0 0 0  Change in appetite 0 0 0  0  Feeling bad or failure about yourself  0 0 0 0  Trouble concentrating 0 0 0 3  Moving slowly or fidgety/restless 0 0 0 0  Suicidal thoughts 0 0 0 0   PHQ-9 Score 0  0  0  3   Difficult doing work/chores Not difficult at all Not difficult at all - -    Details       Data saved with a previous flowsheet row definition        Interpretation of Total Score  Total Score Depression Severity:  1-4 = Minimal depression, 5-9 = Mild depression, 10-14 = Moderate depression, 15-19 = Moderately severe depression, 20-27 = Severe depression   Psychosocial Evaluation and Intervention:   Psychosocial Re-Evaluation:  Psychosocial Re-Evaluation     Row Name 04/30/24 1411 05/29/24 0742 06/25/24 1446         Psychosocial Re-Evaluation  Current issues with None Identified None Identified None Identified     Interventions Encouraged to attend Cardiac Rehabilitation for the exercise Encouraged to attend Cardiac Rehabilitation for the exercise Encouraged to attend Cardiac Rehabilitation for the exercise     Continue Psychosocial Services  No Follow up required No Follow up required No Follow up required        Psychosocial Discharge (Final Psychosocial Re-Evaluation):  Psychosocial Re-Evaluation - 06/25/24 1446       Psychosocial Re-Evaluation   Current issues with None Identified    Interventions Encouraged to attend Cardiac Rehabilitation for the exercise    Continue Psychosocial Services  No Follow up required          Vocational Rehabilitation: Provide vocational rehab assistance to qualifying candidates.   Vocational Rehab Evaluation & Intervention:  Vocational Rehab - 04/18/24 1308       Initial Vocational Rehab Evaluation & Intervention   Assessment shows need for Vocational Rehabilitation No   Taji is retired and does not need vocational rehab at this time         Education: Education Goals: Education classes will be provided on a variety of topics geared toward  better understanding of heart health and risk factor modification. Participant will state understanding/return demonstration of topics presented as noted by education test scores.  Learning Barriers/Preferences:  Learning Barriers/Preferences - 04/18/24 1341       Learning Barriers/Preferences   Learning Barriers Language   He is unable to read and needs individual attention   Learning Preferences Audio;Group Instruction;Individual Instruction;Pictoral;Verbal Instruction          General Cardiac Education Topics:  AED/CPR: - Group verbal and written instruction with the use of models to demonstrate the basic use of the AED with the basic ABC's of resuscitation.   Test and Procedures: - Group verbal and visual presentation and models provide information about basic cardiac anatomy and function. Reviews the testing methods done to diagnose heart disease and the outcomes of the test results. Describes the treatment choices: Medical Management, Angioplasty, or Coronary Bypass Surgery for treating various heart conditions including Myocardial Infarction, Angina, Valve Disease, and Cardiac Arrhythmias. Written material provided at class time.   Medication Safety: - Group verbal and visual instruction to review commonly prescribed medications for heart and lung disease. Reviews the medication, class of the drug, and side effects. Includes the steps to properly store meds and maintain the prescription regimen. Written material provided at class time.   Intimacy: - Group verbal instruction through game format to discuss how heart and lung disease can affect sexual intimacy. Written material provided at class time.   Know Your Numbers and Heart Failure: - Group verbal and visual instruction to discuss disease risk factors for cardiac and pulmonary disease and treatment options.  Reviews associated critical values for Overweight/Obesity, Hypertension, Cholesterol, and Diabetes.  Discusses  basics of heart failure: signs/symptoms and treatments.  Introduces Heart Failure Zone chart for action plan for heart failure. Written material provided at class time.   Infection Prevention: - Provides verbal and written material to individual with discussion of infection control including proper hand washing and proper equipment cleaning during exercise session.   Falls Prevention: - Provides verbal and written material to individual with discussion of falls prevention and safety.   Other: -Provides group and verbal instruction on various topics (see comments)   Knowledge Questionnaire Score:  Knowledge Questionnaire Score - 04/18/24 0900       Knowledge Questionnaire Score  Pre Score 21/24          Core Components/Risk Factors/Patient Goals at Admission:  Personal Goals and Risk Factors at Admission - 04/18/24 1019       Core Components/Risk Factors/Patient Goals on Admission    Weight Management Yes;Weight Maintenance    Expected Outcomes Weight Maintenance: Understanding of the daily nutrition guidelines, which includes 25-35% calories from fat, 7% or less cal from saturated fats, less than 200mg  cholesterol, less than 1.5gm of sodium, & 5 or more servings of fruits and vegetables daily;Understanding recommendations for meals to include 15-35% energy as protein, 25-35% energy from fat, 35-60% energy from carbohydrates, less than 200mg  of dietary cholesterol, 20-35 gm of total fiber daily;Understanding of distribution of calorie intake throughout the day with the consumption of 4-5 meals/snacks    Heart Failure Yes    Intervention Provide a combined exercise and nutrition program that is supplemented with education, support and counseling about heart failure. Directed toward relieving symptoms such as shortness of breath, decreased exercise tolerance, and extremity edema.    Expected Outcomes Improve functional capacity of life;Short term: Attendance in program 2-3 days a  week with increased exercise capacity. Reported lower sodium intake. Reported increased fruit and vegetable intake. Reports medication compliance.;Short term: Daily weights obtained and reported for increase. Utilizing diuretic protocols set by physician.;Long term: Adoption of self-care skills and reduction of barriers for early signs and symptoms recognition and intervention leading to self-care maintenance.    Hypertension Yes    Intervention Provide education on lifestyle modifcations including regular physical activity/exercise, weight management, moderate sodium restriction and increased consumption of fresh fruit, vegetables, and low fat dairy, alcohol moderation, and smoking cessation.;Monitor prescription use compliance.    Expected Outcomes Short Term: Continued assessment and intervention until BP is < 140/62mm HG in hypertensive participants. < 130/37mm HG in hypertensive participants with diabetes, heart failure or chronic kidney disease.;Long Term: Maintenance of blood pressure at goal levels.    Lipids Yes    Intervention Provide education and support for participant on nutrition & aerobic/resistive exercise along with prescribed medications to achieve LDL 70mg , HDL >40mg .    Expected Outcomes Short Term: Participant states understanding of desired cholesterol values and is compliant with medications prescribed. Participant is following exercise prescription and nutrition guidelines.;Long Term: Cholesterol controlled with medications as prescribed, with individualized exercise RX and with personalized nutrition plan. Value goals: LDL < 70mg , HDL > 40 mg.    Personal Goal Other Yes    Personal Goal He wants to feel back to normal, know limits to exercise, get back to the gym, strength    Intervention Will continue to monitor pt and progressworkloads as tolerated without sign or symptom    Expected Outcomes Will continue to monitor pt and progress workloads as tolerated           Education:Diabetes - Individual verbal and written instruction to review signs/symptoms of diabetes, desired ranges of glucose level fasting, after meals and with exercise. Acknowledge that pre and post exercise glucose checks will be done for 3 sessions at entry of program.   Core Components/Risk Factors/Patient Goals Review:   Goals and Risk Factor Review     Row Name 04/30/24 1412 05/29/24 0743 06/25/24 1448         Core Components/Risk Factors/Patient Goals Review   Personal Goals Review Weight Management/Obesity;Heart Failure;Hypertension;Lipids Weight Management/Obesity;Heart Failure;Hypertension;Lipids Weight Management/Obesity;Heart Failure;Hypertension;Lipids     Review Witten is doing well with exercise at cardiac rehab since his 04/22/24 start.  Vital signs and CBG's have been stable, MET levels consistent with orientation MET levels. Damain is doing well with exercise at cardiac rehab since his 04/22/24 start. Vital signs and CBG's have been stable, MET levels have increased. General is doing well with exercise at cardiac rehab since his 04/22/24 start. CBG's and VSS. MET levels been maintained over the last 30 days.     Expected Outcomes Masiah will continue to particpate in cardiac rehab for exercise nutriton and lifestyle modifications. Justyce will continue to particpate in cardiac rehab for exercise nutriton and lifestyle modifications. Boyde will continue to particpate in cardiac rehab for exercise nutriton and lifestyle modifications.        Core Components/Risk Factors/Patient Goals at Discharge (Final Review):   Goals and Risk Factor Review - 06/25/24 1448       Core Components/Risk Factors/Patient Goals Review   Personal Goals Review Weight Management/Obesity;Heart Failure;Hypertension;Lipids    Review Jariah is doing well with exercise at cardiac rehab since his 04/22/24 start. CBG's and VSS. MET levels been maintained over the last 30 days.    Expected Outcomes Josecarlos will continue  to particpate in cardiac rehab for exercise nutriton and lifestyle modifications.          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 Introduction to [facility] education/intensive cardiac rehab. Initial orientation packet reviewed with patient. 30 Day ITP Review. Kynan started cardiac rehab on 04/22/24 and did well with exercise 30 Day ITP Review. Benuel is doing well with exercise at cardiac rehab thus far 30 Day ITP Review. Manolito has good participation with exercise at cardiac rehab when in attendance. 30 Day ITP Review. Jesstin has good participation with exercise at cardiac rehab.      Comments: see ITP comments

## 2024-07-03 ENCOUNTER — Encounter (HOSPITAL_COMMUNITY)
Admission: RE | Admit: 2024-07-03 | Discharge: 2024-07-03 | Disposition: A | Source: Ambulatory Visit | Attending: Internal Medicine

## 2024-07-03 VITALS — Ht 66.75 in | Wt 160.5 lb

## 2024-07-03 DIAGNOSIS — I213 ST elevation (STEMI) myocardial infarction of unspecified site: Secondary | ICD-10-CM

## 2024-07-03 DIAGNOSIS — Z955 Presence of coronary angioplasty implant and graft: Secondary | ICD-10-CM

## 2024-07-05 ENCOUNTER — Encounter (HOSPITAL_COMMUNITY)

## 2024-07-08 ENCOUNTER — Encounter (HOSPITAL_COMMUNITY)
Admission: RE | Admit: 2024-07-08 | Discharge: 2024-07-08 | Disposition: A | Source: Ambulatory Visit | Attending: Internal Medicine

## 2024-07-08 ENCOUNTER — Encounter (HOSPITAL_COMMUNITY)

## 2024-07-08 DIAGNOSIS — I213 ST elevation (STEMI) myocardial infarction of unspecified site: Secondary | ICD-10-CM | POA: Diagnosis not present

## 2024-07-08 DIAGNOSIS — Z955 Presence of coronary angioplasty implant and graft: Secondary | ICD-10-CM

## 2024-07-08 NOTE — Progress Notes (Signed)
 Discharge Progress Report  Patient Details  Name: Jeffery Graves MRN: [id number] Date of Birth: [birth year] Referring Provider:   Flowsheet Row INTENSIVE CARDIAC REHAB ORIENT from 04/18/2024 in [facility]  Referring Provider Dr. Toribio Fuel, MD     Number of Visits: ***  Reason for Discharge:  Patient reached a stable level of exercise. Patient independent in their exercise. Patient has met program and personal goals.  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 Toribio Fuel, MD    Expected Discharge Date 07/12/24      Treadmill   MPH 2.5    Grade 0    Minutes 15    METs 2.91      Recumbant Elliptical   Level 2    RPM 60    Watts 80    Minutes 15    METs 3.2      Prescription Details   Frequency (times per week) 2    Duration Progress to 30 minutes of continuous aerobic without signs/symptoms of physical distress      Intensity   THRR 40-80% of Max Heartrate 62-123    Ratings of Perceived Exertion 11-13    Perceived Dyspnea 0-4      Progression   Progression Continue progressive overload as per policy without signs/symptoms or physical distress.      Resistance Training   Training Prescription Yes    Weight 3 lbs    Reps 10-15          Discharge Exercise Prescription (Final Exercise Prescription Changes):  Exercise Prescription Changes - 06/19/24 0827       Response to Exercise   Blood Pressure (Admit) 120/68    Blood Pressure (Exit) 112/66    Heart Rate (Admit) 66 bpm    Heart Rate (Exercise) 113 bpm    Heart Rate (Exit) 81 bpm    Rating of Perceived Exertion (Exercise) 9    Perceived  Dyspnea (Exercise) 0    Symptoms none    Comments Reviewed MET's and goals    Duration Progress to 30 minutes of  aerobic without signs/symptoms of physical distress    Intensity THRR unchanged      Progression   Progression Continue to progress workloads to maintain intensity without signs/symptoms of physical distress.    Average METs 3.45      Resistance Training   Training Prescription No    Weight 5    Reps 10-15    Time 10 Minutes      Treadmill   MPH 3   stayed on the TM 30 Mins   Grade 0    Minutes 15    METs 3.3      Recumbant Elliptical   Level 3    RPM 54    Watts 66    Minutes 15    METs 3.6      Home Exercise Plan   Plans to continue exercise at Home (comment)    Frequency Add 2 additional days to program exercise sessions.    Initial Home Exercises Provided 05/15/24  Functional Capacity:  6 Minute Walk     Row Name 04/18/24 1007 07/03/24 0830       6 Minute Walk   Phase Initial --    Distance 1390 feet 1740 feet    Distance % Change -- 25.18 %    Distance Feet Change -- 350 ft    Walk Time 6 minutes 6 minutes    # of Rest Breaks 0 0    MPH 2.63 3.3    METS 3.41 3.91    RPE 8 10    Perceived Dyspnea  0 0    VO2 Peak 11.94 13.69    Symptoms No No    Resting HR 70 bpm 67 bpm    Resting BP 102/62 122/70    Resting Oxygen Saturation  100 % --    Exercise Oxygen Saturation  during 6 min walk 99 % --    Max Ex. HR 114 bpm 104 bpm    Max Ex. BP 126/76 124/66    2 Minute Post BP 112/72 --       Psychological, QOL, Others - Outcomes: PHQ 2/9:    07/08/2024    8:09 AM 04/18/2024    9:02 AM 04/16/2024    1:48 PM 06/22/2021    9:06 AM 11/04/2015   10:24 AM  Depression screen PHQ 2/9  Decreased Interest 0 0 0 0 0  Down, Depressed, Hopeless 0 0 0 0 0  PHQ - 2 Score 0 0 0 0 0  Altered sleeping 0 0 0 0 0  Tired, decreased energy 0 0 0 0 0  Change in appetite 0 0 0 0 0  Feeling bad or failure about yourself  0 0 0 0 0  Trouble  concentrating 0 0 0 0 3  Moving slowly or fidgety/restless 0 0 0 0 0  Suicidal thoughts 0 0 0 0 0   PHQ-9 Score 0 0  0  0  3   Difficult doing work/chores  Not difficult at all Not difficult at all       Data saved with a previous flowsheet row definition    Quality of Life:  Quality of Life - 04/18/24 0859       Quality of Life   Select Quality of Life      Quality of Life Scores   Health/Function Pre 22.86 %    Socioeconomic Pre 19.58 %    Psych/Spiritual Pre 22.5 %    Family Pre 20.83 %    GLOBAL Pre 21.92 %          Personal Goals: Goals established at orientation with interventions provided to work toward goal.  Personal Goals and Risk Factors at Admission - 04/18/24 1019       Core Components/Risk Factors/Patient Goals on Admission    Weight Management Yes;Weight Maintenance    Expected Outcomes Weight Maintenance: Understanding of the daily nutrition guidelines, which includes 25-35% calories from fat, 7% or less cal from saturated fats, less than 200mg  cholesterol, less than 1.5gm of sodium, & 5 or more servings of fruits and vegetables daily;Understanding recommendations for meals to include 15-35% energy as protein, 25-35% energy from fat, 35-60% energy from carbohydrates, less than 200mg  of dietary cholesterol, 20-35 gm of total fiber daily;Understanding of distribution of calorie intake throughout the day with the consumption of 4-5 meals/snacks    Heart Failure Yes    Intervention Provide a combined exercise and nutrition program that is supplemented with education, support and counseling about  heart failure. Directed toward relieving symptoms such as shortness of breath, decreased exercise tolerance, and extremity edema.    Expected Outcomes Improve functional capacity of life;Short term: Attendance in program 2-3 days a week with increased exercise capacity. Reported lower sodium intake. Reported increased fruit and vegetable intake. Reports medication  compliance.;Short term: Daily weights obtained and reported for increase. Utilizing diuretic protocols set by physician.;Long term: Adoption of self-care skills and reduction of barriers for early signs and symptoms recognition and intervention leading to self-care maintenance.    Hypertension Yes    Intervention Provide education on lifestyle modifcations including regular physical activity/exercise, weight management, moderate sodium restriction and increased consumption of fresh fruit, vegetables, and low fat dairy, alcohol moderation, and smoking cessation.;Monitor prescription use compliance.    Expected Outcomes Short Term: Continued assessment and intervention until BP is < 140/68mm HG in hypertensive participants. < 130/20mm HG in hypertensive participants with diabetes, heart failure or chronic kidney disease.;Long Term: Maintenance of blood pressure at goal levels.    Lipids Yes    Intervention Provide education and support for participant on nutrition & aerobic/resistive exercise along with prescribed medications to achieve LDL 70mg , HDL >40mg .    Expected Outcomes Short Term: Participant states understanding of desired cholesterol values and is compliant with medications prescribed. Participant is following exercise prescription and nutrition guidelines.;Long Term: Cholesterol controlled with medications as prescribed, with individualized exercise RX and with personalized nutrition plan. Value goals: LDL < 70mg , HDL > 40 mg.    Personal Goal Other Yes    Personal Goal He wants to feel back to normal, know limits to exercise, get back to the gym, strength    Intervention Will continue to monitor pt and progressworkloads as tolerated without sign or symptom    Expected Outcomes Will continue to monitor pt and progress workloads as tolerated           Personal Goals Discharge:  Goals and Risk Factor Review     Row Name 04/30/24 1412 05/29/24 0743 06/25/24 1448         Core  Components/Risk Factors/Patient Goals Review   Personal Goals Review Weight Management/Obesity;Heart Failure;Hypertension;Lipids Weight Management/Obesity;Heart Failure;Hypertension;Lipids Weight Management/Obesity;Heart Failure;Hypertension;Lipids     Review Kristan is doing well with exercise at cardiac rehab since his 04/22/24 start. Vital signs and CBG's have been stable, MET levels consistent with orientation MET levels. Tj is doing well with exercise at cardiac rehab since his 04/22/24 start. Vital signs and CBG's have been stable, MET levels have increased. Mazen is doing well with exercise at cardiac rehab since his 04/22/24 start. CBG's and VSS. MET levels been maintained over the last 30 days.     Expected Outcomes Geneva will continue to particpate in cardiac rehab for exercise nutriton and lifestyle modifications. Jahrell will continue to particpate in cardiac rehab for exercise nutriton and lifestyle modifications. Raye will continue to particpate in cardiac rehab for exercise nutriton and lifestyle modifications.        Exercise Goals and Review:  Exercise Goals     Row Name 04/18/24 1018             Exercise Goals   Increase Physical Activity Yes       Intervention Provide advice, education, support and counseling about physical activity/exercise needs.;Develop an individualized exercise prescription for aerobic and resistive training based on initial evaluation findings, risk stratification, comorbidities and participant's personal goals.       Expected Outcomes Short Term: Attend rehab on a regular basis  to increase amount of physical activity.;Long Term: Exercising regularly at least 3-5 days a week.;Long Term: Add in home exercise to make exercise part of routine and to increase amount of physical activity.       Increase Strength and Stamina Yes       Intervention Provide advice, education, support and counseling about physical activity/exercise needs.;Develop an individualized  exercise prescription for aerobic and resistive training based on initial evaluation findings, risk stratification, comorbidities and participant's personal goals.       Expected Outcomes Short Term: Increase workloads from initial exercise prescription for resistance, speed, and METs.;Short Term: Perform resistance training exercises routinely during rehab and add in resistance training at home;Long Term: Improve cardiorespiratory fitness, muscular endurance and strength as measured by increased METs and functional capacity ( )       Able to understand and use rate of perceived exertion (RPE) scale Yes       Intervention Provide education and explanation on how to use RPE scale       Expected Outcomes Short Term: Able to use RPE daily in rehab to express subjective intensity level;Long Term:  Able to use RPE to guide intensity level when exercising independently       Knowledge and understanding of Target Heart Rate Range (THRR) Yes       Intervention Provide education and explanation of THRR including how the numbers were predicted and where they are located for reference       Expected Outcomes Short Term: Able to state/look up THRR;Long Term: Able to use THRR to govern intensity when exercising independently;Short Term: Able to use daily as guideline for intensity in rehab       Understanding of Exercise Prescription Yes       Intervention Provide education, explanation, and written materials on patient's individual exercise prescription       Expected Outcomes Short Term: Able to explain program exercise prescription;Long Term: Able to explain home exercise prescription to exercise independently          Exercise Goals Re-Evaluation:  Exercise Goals Re-Evaluation     Row Name 04/22/24 0839 05/15/24 0805 06/19/24 0828         Exercise Goal Re-Evaluation   Exercise Goals Review Increase Physical Activity;Understanding of Exercise Prescription;Increase Strength and Stamina;Knowledge and  understanding of Target Heart Rate Range (THRR);Able to understand and use rate of perceived exertion (RPE) scale Increase Physical Activity;Understanding of Exercise Prescription;Increase Strength and Stamina;Knowledge and understanding of Target Heart Rate Range (THRR);Able to understand and use rate of perceived exertion (RPE) scale Increase Physical Activity;Understanding of Exercise Prescription;Increase Strength and Stamina;Knowledge and understanding of Target Heart Rate Range (THRR);Able to understand and use rate of perceived exertion (RPE) scale     Comments Pt first day in the [facility] program. Pt tolerated exercise well with an average MET level of 2.71. He is off to a good start and is learning his THRR, RPE and ExRx REVD MET's, goals and home ExRx. Pt tolerated exercise well with an average MET level of 3.12. He is doing well and progressing MET's. He has a rash today, onsite provider aware, he has an appointment later this month to be seen. He will continue to exercise by going to [employer] [facility] 2 days for 30-45 mins per session. He feels good about his goals and is increasing strength. REVD MET's and goals. Pt tolerated exercise well with an average MET level of 3.45. He is feeling good with exercise. Will review with him why  he decreased on TM, this may have been a data entry error,  but overall, he feels good     Expected Outcomes Will continue to monitor pt and progress workloads as tolerated without sign or symptom Will continue to monitor pt and progress workloads as tolerated without sign or symptom Will continue to monitor pt and progress workloads as tolerated without sign or symptom        Nutrition & Weight - Outcomes:  Pre Biometrics - 04/18/24 1018       Pre Biometrics   Height 5' 6.75 (1.695 m)    Weight 76.7 kg    Waist Circumference 35 inches    Hip Circumference 38 inches    Waist to Hip Ratio 0.92 %    BMI (Calculated) 26.7    Triceps Skinfold 11 mm    % Body Fat  23.6 %    Grip Strength 15 kg    Flexibility 9 in    Single Leg Stand 24 seconds          Post Biometrics - 07/03/24 1641        Post  Biometrics   Height 5' 6.75 (1.695 m)    Weight 72.8 kg    Waist Circumference 34.5 inches    Hip Circumference 37 inches    Waist to Hip Ratio 0.93 %    BMI (Calculated) 25.34    Triceps Skinfold 6 mm    % Body Fat 20.5 %    Grip Strength 32 kg    Flexibility 9 in    Single Leg Stand 11 seconds          Nutrition:   Nutrition Discharge:  Nutrition Assessments - 04/22/24 0924       MEDFICTS Scores   Pre Score 59          Education Questionnaire Score:  Knowledge Questionnaire Score - 04/18/24 0900       Knowledge Questionnaire Score   Pre Score 21/24         Pt graduates from  Intensive/Traditional cardiac rehab program today with completion of    ** exercise and education sessions. Pt maintained good attendance and progressed nicely during their participation in rehab as evidenced by increased MET level.   Medication list reconciled. Repeat  PHQ score-0.  Pt has made significant lifestyle changes and should be commended for his success.  Siris achieved his goals during cardiac rehab.   Pt plans to continue exercise at 5x/week at [employer] [facility].  Goals reviewed with patient; copy given to patient.

## 2024-07-09 ENCOUNTER — Other Ambulatory Visit: Payer: Self-pay

## 2024-07-10 ENCOUNTER — Encounter (HOSPITAL_COMMUNITY)
Admission: RE | Admit: 2024-07-10 | Discharge: 2024-07-10 | Disposition: A | Discharge status: Home / Self Care | Source: Ambulatory Visit | Attending: Internal Medicine | Admitting: Internal Medicine

## 2024-07-10 ENCOUNTER — Other Ambulatory Visit: Payer: Self-pay

## 2024-07-10 DIAGNOSIS — I213 ST elevation (STEMI) myocardial infarction of unspecified site: Secondary | ICD-10-CM

## 2024-07-10 DIAGNOSIS — Z955 Presence of coronary angioplasty implant and graft: Secondary | ICD-10-CM

## 2024-07-12 ENCOUNTER — Encounter (HOSPITAL_COMMUNITY)

## 2024-07-17 ENCOUNTER — Other Ambulatory Visit: Payer: Self-pay

## 2024-07-18 ENCOUNTER — Other Ambulatory Visit: Payer: Self-pay | Admitting: Family Medicine

## 2024-07-18 ENCOUNTER — Telehealth: Payer: Self-pay

## 2024-07-18 ENCOUNTER — Other Ambulatory Visit: Payer: Self-pay

## 2024-07-18 NOTE — Telephone Encounter (Signed)
 Routing to PCP for review.

## 2024-07-18 NOTE — Telephone Encounter (Signed)
 Patient came in requesting refills on tamsulosin  (FLOMAX ) 0.4 MG CAPS capsule he states the pharmacy told him his PCP would have to approve the medication first as its prescribed by Baldwin Ubaldo Motto, NPC. Advised patient I would send over a tele encounter and he would have to wait on a call from the CMA for further information or the pharmacy.

## 2024-07-19 ENCOUNTER — Other Ambulatory Visit: Payer: Self-pay

## 2024-07-19 MED ORDER — TAMSULOSIN HCL 0.4 MG PO CAPS
ORAL_CAPSULE | ORAL | 3 refills | Status: AC
  Filled 2024-07-19: qty 180, 90d supply, fill #0

## 2024-07-19 NOTE — Telephone Encounter (Signed)
Prescription has been refilled.

## 2024-07-24 ENCOUNTER — Other Ambulatory Visit: Payer: Self-pay

## 2024-07-29 ENCOUNTER — Other Ambulatory Visit (HOSPITAL_COMMUNITY): Payer: Self-pay | Admitting: Physician Assistant

## 2024-07-29 ENCOUNTER — Other Ambulatory Visit: Payer: Self-pay

## 2024-07-29 MED ORDER — ENTRESTO 97-103 MG PO TABS
1.0000 | ORAL_TABLET | Freq: Two times a day (BID) | ORAL | 2 refills | Status: DC
  Filled 2024-07-29: qty 90, 45d supply, fill #0

## 2024-07-31 ENCOUNTER — Other Ambulatory Visit: Payer: Self-pay

## 2024-08-05 ENCOUNTER — Other Ambulatory Visit (HOSPITAL_COMMUNITY): Payer: Self-pay

## 2024-08-06 ENCOUNTER — Other Ambulatory Visit (HOSPITAL_COMMUNITY): Payer: Self-pay

## 2024-08-13 ENCOUNTER — Other Ambulatory Visit: Payer: Self-pay

## 2024-08-14 ENCOUNTER — Ambulatory Visit: Payer: Self-pay | Attending: Family Medicine | Admitting: Family Medicine

## 2024-08-14 ENCOUNTER — Encounter: Payer: Self-pay | Admitting: Family Medicine

## 2024-08-14 ENCOUNTER — Other Ambulatory Visit: Payer: Self-pay

## 2024-08-14 VITALS — BP 108/66 | HR 66 | Temp 98.3°F | Ht 66.75 in | Wt 168.6 lb

## 2024-08-14 DIAGNOSIS — I213 ST elevation (STEMI) myocardial infarction of unspecified site: Secondary | ICD-10-CM | POA: Diagnosis not present

## 2024-08-14 DIAGNOSIS — L309 Dermatitis, unspecified: Secondary | ICD-10-CM

## 2024-08-14 DIAGNOSIS — N400 Enlarged prostate without lower urinary tract symptoms: Secondary | ICD-10-CM

## 2024-08-14 DIAGNOSIS — I255 Ischemic cardiomyopathy: Secondary | ICD-10-CM | POA: Diagnosis not present

## 2024-08-14 DIAGNOSIS — I10 Essential (primary) hypertension: Secondary | ICD-10-CM

## 2024-08-14 DIAGNOSIS — Z8546 Personal history of malignant neoplasm of prostate: Secondary | ICD-10-CM

## 2024-08-14 MED ORDER — TRIAMCINOLONE ACETONIDE 0.1 % EX CREA
1.0000 | TOPICAL_CREAM | Freq: Two times a day (BID) | CUTANEOUS | 1 refills | Status: AC
  Filled 2024-08-14: qty 30, 15d supply, fill #0

## 2024-08-14 NOTE — Progress Notes (Signed)
 "  Subjective:  Patient ID: Jeffery Graves, male    DOB: 12-31-1955  Age: 69 y.o. MRN: [id number]  CC: Medical Management of Chronic Issues     Discussed the use of AI scribe software for clinical note transcription with the patient, who gave verbal consent to proceed.  History of Present Illness The patient, with  a history of Adenocarcinoma of the prostate (s/p treatment), Benign prostatic hypertrophy, hypertension, allergic rhinitis , OOH cardiac arrest s/p resuscitation, had a V-fib shockable rhythm status post defibrillation from the AED achieved ROSC (in 03/2024), STEMI status post PCI to R PLA, ischemic cardiomyopathy (EF 50 to 55% in 06/2024 improved from 40%) presents for follow-up.  He has followed up with cardiology since discharge and he completed cardiac rehabilitation and is taking aspirin , Plavix , Brilinta , Entresto , metoprolol , and spironolactone . He feels much better since the procedure and has no chest pain.  He has dermatitis with a mild rash around his belly button and on his arm. The rash improved in the past with a topical cream, and he is requesting this again today.  He continues to follow-up with his urologist post prostate cancer treatment.  Endorses adherence with his antihypertensives.    Past Medical History:  Diagnosis Date   BPH (benign prostatic hyperplasia)    Coronary artery disease    Hx of radiation therapy 09/06/10 - 10/29/10   prostate, seminal vesicles   Hypertension    on meds   Prostate cancer (HCC) 04/26/2010   gleason 7, vol 20.88 cc    Past Surgical History:  Procedure Laterality Date   BIOPSY PROSTATE     CARDIAC CATHETERIZATION     CORONARY/GRAFT ACUTE MI REVASCULARIZATION N/A 03/15/2024   Procedure: Coronary/Graft Acute MI Revascularization;  Surgeon: Elmira Newman PARAS, MD;  Location: [facility];  Service: Cardiovascular;  Laterality: N/A;   CRYOABLATION N/A 03/06/2024   Procedure: CRYOABLATION, PROSTATE, CYSTOSCOPY;   Surgeon: Alvaro Ricardo KATHEE Mickey., MD;  Location: [facility];  Service: Urology;  Laterality: N/A;    Family History  Problem Relation Age of Onset   Stroke Mother    Colon cancer Neg Hx    Colon polyps Neg Hx    Esophageal cancer Neg Hx    Stomach cancer Neg Hx    Rectal cancer Neg Hx     Social History   Socioeconomic History   Marital status: Single    Spouse name: Not on file   Number of children: Not on file   Years of education: Not on file   Highest education level: Patient refused  Occupational History   Occupation: Retired  Tobacco Use   Smoking status: Never   Smokeless tobacco: Never  Vaping Use   Vaping status: Never Used  Substance and Sexual Activity   Alcohol use: Never    Alcohol/week: 7.0 standard drinks of alcohol    Types: 7 Standard drinks or equivalent per week   Drug use: No   Sexual activity: Not Currently  Other Topics Concern   Not on file  Social History Narrative   Not on file   Social Drivers of Health   Tobacco Use: Low Risk (08/14/2024)   Patient History    Smoking Tobacco Use: Never    Smokeless Tobacco Use: Never    Passive Exposure: Not on file  Financial Resource Strain: Not on file  Food Insecurity: No Food Insecurity (03/17/2024)   Epic    Worried About Radiation Protection Practitioner of Food in the Last Year: Never  true    Ran Out of Food in the Last Year: Never true  Transportation Needs: No Transportation Needs (03/17/2024)   Epic    Lack of Transportation (Medical): No    Lack of Transportation (Non-Medical): No  Physical Activity: Not on file  Stress: Not on file  Social Connections: Unknown (03/17/2024)   Social Connection and Isolation Panel    Frequency of Communication with Friends and Family: Patient declined    Frequency of Social Gatherings with Friends and Family: Patient declined    Attends Religious Services: Patient declined    Database Administrator or Organizations: Not on file    Attends Banker Meetings: Not on file     Marital Status: Patient declined  Depression (PHQ2-9): Low Risk (07/08/2024)   Depression (PHQ2-9)    PHQ-2 Score: 0  Alcohol Screen: Not on file  Housing: Low Risk (03/17/2024)   Epic    Unable to Pay for Housing in the Last Year: No    Number of Times Moved in the Last Year: 0    Homeless in the Last Year: No  Utilities: Not At Risk (03/17/2024)   Epic    Threatened with loss of utilities: No  Health Literacy: Not on file    Allergies[1]  Outpatient Medications Prior to Visit  Medication Sig Dispense Refill   aspirin  81 MG chewable tablet Chew 1 tablet (81 mg total) by mouth daily. 90 tablet 2   atorvastatin  (LIPITOR ) 80 MG tablet Take 1 tablet (80 mg total) by mouth daily. 90 tablet 2   clopidogrel  (PLAVIX ) 75 MG tablet Take 1 tablet (75 mg total) by mouth daily. 90 tablet 2   dapagliflozin  propanediol (FARXIGA ) 10 MG TABS tablet Take 1 tablet (10 mg total) by mouth daily before breakfast. 30 tablet 6   ENTRESTO  97-103 MG Take 1 tablet by mouth 2 (two) times daily. 90 tablet 2   metoprolol  succinate (TOPROL -XL) 25 MG 24 hr tablet Take 1 tablet (25 mg total) by mouth daily. 90 tablet 2   nitroGLYCERIN  (NITROSTAT ) 0.4 MG SL tablet Place 1 tablet (0.4 mg total) under the tongue every 5 (five) minutes as needed for chest pain. 25 tablet 3   spironolactone  (ALDACTONE ) 25 MG tablet Take 1 tablet (25 mg total) by mouth daily. 90 tablet 2   tamsulosin  (FLOMAX ) 0.4 MG CAPS capsule Take 1 tablet orally twice daily. For urination 180 capsule 3   triamcinolone  cream (KENALOG ) 0.1 % Apply 1 Application topically 2 (two) times daily. 30 g 1   Brimonidine  Tartrate (LUMIFY  OP) Place 1 drop into both eyes daily. (Patient not taking: Reported on 08/14/2024)     XTANDI 80 MG tablet Take 80 mg by mouth daily. (Patient not taking: Reported on 08/14/2024)     potassium chloride  SA (KLOR-CON  M) 20 MEQ tablet Take 1 tablet (20 mEq total) by mouth daily for 10 days. (Patient not taking: Reported on  08/14/2024) 10 tablet 0   senna-docusate (SENOKOT-S) 8.6-50 MG tablet Take 1 tablet by mouth 2 (two) times daily. While taking pain meds to prevent constipation (Patient not taking: Reported on 08/14/2024) 10 tablet 0   No facility-administered medications prior to visit.     ROS Review of Systems  Constitutional:  Negative for activity change and appetite change.  HENT:  Negative for sinus pressure and sore throat.   Respiratory:  Negative for chest tightness, shortness of breath and wheezing.   Cardiovascular:  Negative for chest pain and palpitations.  Gastrointestinal:  Negative for abdominal distention, abdominal pain and constipation.  Genitourinary: Negative.   Musculoskeletal: Negative.   Skin:  Positive for rash.  Psychiatric/Behavioral:  Negative for behavioral problems and dysphoric mood.     Objective:  BP 108/66   Pulse 66   Temp 98.3 F (36.8 C) (Oral)   Ht 5' 6.75 (1.695 m)   Wt 168 lb 9.6 oz (76.5 kg)   SpO2 100%   BMI 26.60 kg/m      08/14/2024    9:08 AM 07/03/2024    4:41 PM 07/01/2024    8:54 AM  BP/Weight  Systolic BP 108  887  Diastolic BP 66  70  Wt. (Lbs) 168.6 160.5 162  BMI 26.6 kg/m2 25.33 kg/m2 26.15 kg/m2      Physical Exam Constitutional:      Appearance: He is well-developed.  Cardiovascular:     Rate and Rhythm: Normal rate.     Heart sounds: Normal heart sounds. No murmur heard. Pulmonary:     Effort: Pulmonary effort is normal.     Breath sounds: Normal breath sounds. No wheezing or rales.  Chest:     Chest wall: No tenderness.  Abdominal:     General: Bowel sounds are normal. There is no distension.     Palpations: Abdomen is soft. There is no mass.     Tenderness: There is no abdominal tenderness.  Musculoskeletal:        General: Normal range of motion.     Right lower leg: No edema.     Left lower leg: No edema.  Skin:    Comments: Coarse rash on anterior abdominal wall  Neurological:     Mental Status: He is alert  and oriented to person, place, and time.  Psychiatric:        Mood and Affect: Mood normal.        Latest Ref Rng & Units 06/10/2024   11:03 AM 04/29/2024    8:40 AM 04/08/2024    9:31 AM  CMP  Glucose 70 - 99 mg/dL 89  899  79   BUN 8 - 23 mg/dL 13  12  12    Creatinine 0.61 - 1.24 mg/dL 9.21  9.04  9.02   Sodium 135 - 145 mmol/L 137  135  138   Potassium 3.5 - 5.1 mmol/L 4.1  4.1  3.4   Chloride 98 - 111 mmol/L 105  104  105   CO2 22 - 32 mmol/L 25  23  24    Calcium  8.9 - 10.3 mg/dL 8.7  8.9  8.3   Total Protein 6.5 - 8.1 g/dL 6.6     Total Bilirubin 0.0 - 1.2 mg/dL 0.7     Alkaline Phos 38 - 126 U/L 74     AST 15 - 41 U/L 16     ALT 0 - 44 U/L 19       Lipid Panel     Component Value Date/Time   CHOL 164 03/15/2024 1139   CHOL 199 12/27/2022 0920   TRIG 39 03/15/2024 1139   HDL 52 03/15/2024 1139   HDL 87 12/27/2022 0920   CHOLHDL 3.2 03/15/2024 1139   VLDL 8 03/15/2024 1139   LDLCALC 104 (H) 03/15/2024 1139   LDLCALC 102 (H) 12/27/2022 0920    CBC    Component Value Date/Time   WBC 9.0 03/19/2024 0209   RBC 4.20 (L) 03/19/2024 0209   HGB 11.3 (L) 03/19/2024 0209   HGB 13.5 02/12/2024 0931  HCT 34.2 (L) 03/19/2024 0209   HCT 40.6 02/12/2024 0931   PLT 189 03/19/2024 0209   PLT 187 02/12/2024 0931   MCV 81.4 03/19/2024 0209   MCV 82 02/12/2024 0931   MCH 26.9 03/19/2024 0209   MCHC 33.0 03/19/2024 0209   RDW 13.8 03/19/2024 0209   RDW 13.3 02/12/2024 0931   LYMPHSABS 2.9 03/15/2024 1139   LYMPHSABS 1.6 02/12/2024 0931   MONOABS 1.0 03/15/2024 1139   EOSABS 0.2 03/15/2024 1139   EOSABS 0.1 02/12/2024 0931   BASOSABS 0.1 03/15/2024 1139   BASOSABS 0.0 02/12/2024 0931    Lab Results  Component Value Date   HGBA1C 5.2 03/16/2024       Assessment & Plan Ischemic cardiomyopathy EF 50 to 55% Recent cardiac arrest and stent placement. Heart function improved post-cardiac rehabilitation. - Referred to cardiologist for follow-up in two months. -  Continue Entresto , metoprolol , and spironolactone .   Hypertension - Controlled - Continue current antihypertensives -Counseled on blood pressure goal of less than 130/80, low-sodium, DASH diet, medication compliance, 150 minutes of moderate intensity exercise per week. Discussed medication compliance, adverse effects.   Coronary artery disease with history of STEMI and stent placement STEMI in August with stent placement. On dual antiplatelet therapy  - Continue aspirin  and Plavix  until August 2026, then transition to Brilinta . - Ensure follow-up with cardiology. - Secondary risk factor modification including statin use  History of prostate cancer/ BPH Status post prior ablation in 03/2024 - Continue to follow-up with urology   Dermatitis Persistent dermatitis around umbilicus. Previous topical treatment effective. - Prescribed topical steroid cream. - Advised to contact dermatology for earlier appointment if possible.       Meds ordered this encounter  Medications   triamcinolone  cream (KENALOG ) 0.1 %    Sig: Apply 1 Application topically 2 (two) times daily.    Dispense:  30 g    Refill:  1    Follow-up: Return in about 6 months (around 02/11/2025) for Chronic medical conditions.       Corrina Sabin, MD, FAAFP. [facility] [place], [place] [phone]   08/14/2024, 6:16 PM     [1] No Known Allergies  "

## 2024-08-14 NOTE — Patient Instructions (Addendum)
 [facility] Address: [address], [place], [place] [address] Phone: [phone]  Rash, Adult A rash is a breakout of spots or blotches on the skin. It can affect the way the skin looks and feels. Many things can cause a rash. Common causes include: Viral infections. These include colds, measles, and hand, foot, and mouth disease. Bacterial infections. These include scarlet fever and impetigo. Fungal infections. These include athlete's foot, ringworm, and yeast rashes. Skin irritation. This may be from heat rash, exposure to moisture or friction for a long time (intertrigo), or exposure to soap or skin care products (eczema). Allergic reactions. These may be caused by foods, medicines, or things like poison ivy. Some rashes may go away after a few days. Others may last for a few weeks. The goal of treatment is to stop the itching and keep the rash from spreading. Follow these instructions at home: Medicine Take or apply over-the-counter and prescription medicines only as told by your health care provider. These may include: Corticosteroids. These can help treat red or swollen skin. They may be given as creams or as medicines to take by mouth (orally). Anti-itch lotions. Allergy medicines. Pain medicine. Antifungal medicine if the rash is from a fungal infection. Antibiotics if you have an infection.  Skin care Apply cool, wet cloths (compresses) to the affected areas. Do not scratch or rub your skin. Avoid covering the rash. Keep it exposed to air as often as you can. Managing itching and discomfort Avoid hot showers and baths. These can make itching worse. A cold shower may help. Try taking a bath with: Epsom salts. You can get these at your local pharmacy or grocery store. Follow the instructions on the package. Baking soda. Pour a small amount into the bath as told by your provider. Colloidal oatmeal. You can get this at your local pharmacy or grocery store.  Follow the instructions on the package. Try putting baking soda paste on your skin. Stir water into baking soda until it becomes like a paste. Try using calamine lotion or cortisone cream to help with itchiness. Keep cool. Stay out of the sun. Sweating and being hot can make itching worse. General instructions  Rest as needed. Drink enough fluid to keep your pee (urine) pale yellow. Wear loose-fitting clothes. Avoid scented soaps, detergents, and perfumes. Use gentle soaps, detergents, perfumes, and cosmetics. Avoid the things that cause your rash (triggers). Keep a journal to help keep track of your triggers. Write down: What you eat. What cosmetics you use. What you drink. What you wear. This includes jewelry. Contact a health care provider if: You sweat at night more than normal. You pee (urinate) more or less than normal, or your pee is a darker color than normal. Your eyes become sensitive to light. Your skin or the white parts of your eyes turn yellow (jaundice). Your skin tingles or is numb. You get painful blisters in your nose or mouth. Your rash does not go away after a few days, or it gets worse. You are more tired or thirsty than normal. You have new or worse symptoms. These may include: Pain in your abdomen. Fever. Diarrhea or vomiting. Weakness or weight loss. Get help right away if: You get confused. You have a severe headache, a stiff neck, or severe joint pain or stiffness. You become very sleepy or not responsive. You have a seizure. This information is not intended to replace advice given to you by your health care provider. Make sure  you discuss any questions you have with your health care provider. Document Revised: 05/06/2022 Document Reviewed: 05/06/2022 Elsevier Patient Education  2024 [employer].

## 2024-08-15 LAB — CMP14+EGFR
ALT: 26 IU/L (ref 0–44)
AST: 17 IU/L (ref 0–40)
Albumin: 4.1 g/dL (ref 3.9–4.9)
Alkaline Phosphatase: 87 IU/L (ref 47–123)
BUN/Creatinine Ratio: 21 (ref 10–24)
BUN: 19 mg/dL (ref 8–27)
Bilirubin Total: 0.7 mg/dL (ref 0.0–1.2)
CO2: 22 mmol/L (ref 20–29)
Calcium: 9 mg/dL (ref 8.6–10.2)
Chloride: 102 mmol/L (ref 96–106)
Creatinine, Ser: 0.9 mg/dL (ref 0.76–1.27)
Globulin, Total: 2.4 g/dL (ref 1.5–4.5)
Glucose: 84 mg/dL (ref 70–99)
Potassium: 4.5 mmol/L (ref 3.5–5.2)
Sodium: 138 mmol/L (ref 134–144)
Total Protein: 6.5 g/dL (ref 6.0–8.5)
eGFR: 94 mL/min/1.73

## 2024-08-15 LAB — LP+NON-HDL CHOLESTEROL
Cholesterol, Total: 88 mg/dL — ABNORMAL LOW (ref 100–199)
HDL: 50 mg/dL
LDL Chol Calc (NIH): 29 mg/dL (ref 0–99)
Total Non-HDL-Chol (LDL+VLDL): 38 mg/dL (ref 0–129)
Triglycerides: 21 mg/dL (ref 0–149)
VLDL Cholesterol Cal: 9 mg/dL (ref 5–40)

## 2024-08-16 ENCOUNTER — Ambulatory Visit: Payer: Self-pay | Admitting: Family Medicine

## 2024-08-16 NOTE — Progress Notes (Signed)
 " Cardiology Office Note:  .   Date:  08/19/2024  ID:  Jeffery Graves, DOB Feb 04, 1956, MRN [id number] PCP: Delbert Clam, MD  [facility] Providers Cardiologist:  None   History of Present Illness: .    Chief Complaint  Patient presents with   Follow-up    Jeffery Graves is a 69 y.o. male with below history who presents for follow-up.   History of Present Illness   Jeffery Graves is a 69 year old male with ischemic cardiomyopathy and prior STEMI who presents for follow-up after improvement in ejection fraction. He was referred by Dr. Luz after his heart function improved.  In August 2025, he experienced an out-of-hospital ventricular fibrillation arrest secondary to an inferolateral STEMI, which was treated with stent placement. Since then, he has been doing well without any chest pain or dyspnea. His ejection fraction, which had decreased to 40% post-STEMI, has improved to 55% with current medical therapy.  He has a history of high blood pressure, which is well-controlled with medication. He denies having diabetes. He also has a history of prostate cancer, which has been treated.  He is currently on a regimen that includes aspirin , Plavix , Lipitor  80 mg, metoprolol  succinate 25 mg, Entresto  97/103 mg twice daily, spironolactone  25 mg, and Farxiga  10 mg.  He exercises regularly, engaging in cardio activities 5-6 days a week, and reports no issues during physical activity.  No family history of heart disease. He does not smoke and consumes alcohol. 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           Problem List CAD -OHCA 2/2 VF 03/15/2024 -inferolateral STEMI -> PCI RPLV -80% PDA 2. Ischemic CM -EF 40% 03/2024 -50-55% 06/2024 3. PVCs -5.4% burden  4. HTN 5. HLD -T chol 88, HDL 50, LDL 29, TG 21 6. Prostate CA -s/p cryoablation     ROS: All other ROS reviewed and negative. Pertinent positives noted in the HPI.      Studies Reviewed: SABRA       TTE 06/10/2024  1. Left ventricular ejection fraction, by estimation, is 50 to 55%. The  left ventricle has low normal function. The left ventricle demonstrates  regional wall motion abnormalities (see scoring diagram/findings for  description). There is mild concentric  left ventricular hypertrophy. Left ventricular diastolic parameters were  normal. There is mild hypokinesis of the left ventricular, mid inferior  wall and inferoseptal wall. There is moderate hypokinesis of the left  ventricular, mid inferolateral wall.   2. Right ventricular systolic function is normal. The right ventricular  size is normal.   3. The mitral valve is normal in structure. Trivial mitral valve  regurgitation. No evidence of mitral stenosis.   4. The aortic valve is grossly normal. There is mild calcification of the  aortic valve. Aortic valve regurgitation is not visualized. No aortic  stenosis is present.   5. The inferior vena cava is normal in size with greater than 50%  respiratory variability, suggesting right atrial pressure of 3 mmHg.  Physical Exam:   VS:  BP 108/62   Pulse (!) 56   Ht 5' 6.75 (1.695 m)   Wt 165 lb 14.4 oz (75.3 kg)   SpO2 94%   BMI 26.18 kg/m    Wt Readings from Last 3 Encounters:  08/19/24 165 lb 14.4 oz (75.3 kg)  08/14/24 168 lb 9.6 oz (76.5 kg)  07/03/24 160 lb 7.9 oz (  72.8 kg)    GEN: Well nourished, well developed in no acute distress NECK: No JVD; No carotid bruits CARDIAC: RRR, no murmurs, rubs, gallops RESPIRATORY:  Clear to auscultation without rales, wheezing or rhonchi  ABDOMEN: Soft, non-tender, non-distended EXTREMITIES:  No edema; No deformity  ASSESSMENT AND PLAN: .   Assessment and Plan    Coronary artery disease, post-STEMI, with PCI and residual disease Post-infralateral STEMI with PCI in August 2025. Residual 80% stenosis in PDA. Asymptomatic. Managed medically. - Continue aspirin  and Plavix  for one year. -  Discontinue Plavix  in seven months.  Ischemic cardiomyopathy with recovered ejection fraction Ejection fraction improved to 50-55%. No heart failure symptoms. - Continue current medication regimen. Entersto 97-103 mg BID, jardiance 10 mg daily, aldactone  25 mg daily, metoprolol  succinate 25 mg daily.   Chronic systolic heart failure, compensated Compensated with no heart failure symptoms. - Continue current medication regimen.  Primary hypertension Well-controlled with current medication regimen. - Continue current medication regimen.  Mixed hyperlipidemia Recent LDL of 29. Managed with Lipitor . - Continue Lipitor .                Follow-up: Return in about 7 months (around 03/19/2025).  Signed, Darryle DASEN. Barbaraann, MD, [facility]  [facility]  [address] [place], [place] [address] [phone]  10:51 AM   "

## 2024-08-16 NOTE — Telephone Encounter (Signed)
 Copied from CRM [id number]. Topic: Clinical - Lab/Test Results >> Aug 16, 2024  2:34 PM   Montie POUR wrote:  Reason for CRM:  I gave Mr. Foree his lab results dated 08/26/24. He has no questions or concern.

## 2024-08-16 NOTE — Telephone Encounter (Signed)
 FYI from E2C2

## 2024-08-17 LAB — PSA: Prostate Specific Ag, Serum: 0.3 ng/mL (ref 0.0–4.0)

## 2024-08-17 LAB — SPECIMEN STATUS REPORT

## 2024-08-19 ENCOUNTER — Ambulatory Visit (INDEPENDENT_AMBULATORY_CARE_PROVIDER_SITE_OTHER): Admitting: Cardiovascular Disease

## 2024-08-19 ENCOUNTER — Encounter (HOSPITAL_BASED_OUTPATIENT_CLINIC_OR_DEPARTMENT_OTHER): Payer: Self-pay | Admitting: Cardiovascular Disease

## 2024-08-19 VITALS — BP 108/62 | HR 56 | Ht 66.75 in | Wt 165.9 lb

## 2024-08-19 DIAGNOSIS — I5022 Chronic systolic (congestive) heart failure: Secondary | ICD-10-CM

## 2024-08-19 DIAGNOSIS — I255 Ischemic cardiomyopathy: Secondary | ICD-10-CM | POA: Diagnosis not present

## 2024-08-19 DIAGNOSIS — I493 Ventricular premature depolarization: Secondary | ICD-10-CM | POA: Diagnosis not present

## 2024-08-19 DIAGNOSIS — I2510 Atherosclerotic heart disease of native coronary artery without angina pectoris: Secondary | ICD-10-CM

## 2024-08-19 DIAGNOSIS — I10 Essential (primary) hypertension: Secondary | ICD-10-CM

## 2024-08-19 DIAGNOSIS — E782 Mixed hyperlipidemia: Secondary | ICD-10-CM

## 2024-08-19 NOTE — Patient Instructions (Signed)
 Medication Instructions:   Your physician recommends that you continue on your current medications as directed. Please refer to the Current Medication list given to you today.  *If you need a refill on your cardiac medications before your next appointment, please call your pharmacy*    Follow-Up: At [facility], you and your health needs are our priority.  As part of our continuing mission to provide you with exceptional heart care, our providers are all part of one team.  This team includes your primary Cardiologist (physician) and Advanced Practice Providers or APPs (Physician Assistants and Nurse Practitioners) who all work together to provide you with the care you need, when you need it.  Your next appointment:   7 month(s)  Provider:   Darryle Decent, MD

## 2024-08-22 ENCOUNTER — Other Ambulatory Visit (HOSPITAL_COMMUNITY): Payer: Self-pay | Admitting: Physician Assistant

## 2024-08-22 ENCOUNTER — Other Ambulatory Visit: Payer: Self-pay

## 2024-08-23 ENCOUNTER — Other Ambulatory Visit: Payer: Self-pay

## 2024-08-23 MED ORDER — ENTRESTO 97-103 MG PO TABS
1.0000 | ORAL_TABLET | Freq: Two times a day (BID) | ORAL | 2 refills | Status: AC
  Filled 2024-08-23: qty 90, 45d supply, fill #0
  Filled ????-??-??: fill #0

## 2024-08-28 ENCOUNTER — Other Ambulatory Visit: Payer: Self-pay

## 2024-09-05 ENCOUNTER — Other Ambulatory Visit: Payer: Self-pay

## 2024-09-06 ENCOUNTER — Other Ambulatory Visit: Payer: Self-pay

## 2024-09-10 ENCOUNTER — Ambulatory Visit: Payer: Self-pay

## 2024-12-25 ENCOUNTER — Ambulatory Visit: Admitting: Dermatology

## 2025-02-11 ENCOUNTER — Ambulatory Visit: Payer: Self-pay | Admitting: Family Medicine
